# Patient Record
Sex: Female | Born: 1995 | Race: White | Hispanic: No | Marital: Married | Smoking: Never smoker
Health system: Southern US, Community
[De-identification: ages and names within clinical notes are randomized; demographics above are authoritative.]

## PROBLEM LIST (undated history)

## (undated) ENCOUNTER — Inpatient Hospital Stay: Payer: Self-pay

## (undated) DIAGNOSIS — G43909 Migraine, unspecified, not intractable, without status migrainosus: Secondary | ICD-10-CM

## (undated) DIAGNOSIS — R55 Syncope and collapse: Secondary | ICD-10-CM

## (undated) DIAGNOSIS — J45909 Unspecified asthma, uncomplicated: Secondary | ICD-10-CM

## (undated) DIAGNOSIS — Q2111 Secundum atrial septal defect: Secondary | ICD-10-CM

## (undated) DIAGNOSIS — Q211 Atrial septal defect: Secondary | ICD-10-CM

## (undated) DIAGNOSIS — D649 Anemia, unspecified: Secondary | ICD-10-CM

## (undated) HISTORY — DX: Syncope and collapse: R55

---
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
## 2015-01-24 ENCOUNTER — Ambulatory Visit
Admission: RE | Admit: 2015-01-24 | Discharge: 2015-01-24 | Disposition: A | Discharge status: Home / Self Care | Payer: Medicaid Other | Source: Ambulatory Visit | Attending: Obstetrics and Gynecology | Admitting: Obstetrics and Gynecology

## 2015-01-24 VITALS — Wt 165.0 lb

## 2015-01-24 DIAGNOSIS — Z8719 Personal history of other diseases of the digestive system: Secondary | ICD-10-CM | POA: Insufficient documentation

## 2015-01-24 DIAGNOSIS — Z8774 Personal history of (corrected) congenital malformations of heart and circulatory system: Secondary | ICD-10-CM

## 2015-01-24 DIAGNOSIS — Z9889 Other specified postprocedural states: Secondary | ICD-10-CM

## 2015-01-24 NOTE — Progress Notes (Signed)
The patient was scheduled for genetic counseling today with [facility] in [place] (at [facility]).  The patient has a personal history of diaphragmatic hernia and ASD, both of which required surgery at an early age.  The patient called prior to her appointment to request an ultrasound appointment, which we could not provide today but informed her that an ultrasound appointment had been scheduled for 01/28/15.  She was offered the option to have her genetic counseling today (01/24/15) or on Monday, 01/28/15 at the time of her ultrasound.    When the patient presented today, she informed us that an ultrasound could be performed at [facility] at 3:30pm today.  She is interested in knowing fetal sex.  We informed her that her referring physician had recommended a detailed fetal ultrasound due to her personal history and explained the benefits of a detailed study in evaluating the baby for anatomical abnormalities.  When our schedule could not permit her to be added on for ultrasound today, she stated that she was not going to stay.  She cancelled her genetic counseling appointment and then chose to cancel her ultrasound on Monday.  Our nurse, Shanda Bumps, contacted [facility] and spoke with Baird Lyons to convey this information.  Baird Lyons confirmed that the ultrasound appointment scheduled for 01/28/15 should be cancelled.  If genetic counseling or future ultrasound appointments are desired, please feel free to contact our office so an appointment can be scheduled.

## 2015-01-24 NOTE — Progress Notes (Signed)
Pt with complex history of [facility] and ASD She declined full appt today  We can see her in the future if desired

## 2015-01-28 ENCOUNTER — Inpatient Hospital Stay: Admission: RE | Admit: 2015-01-28 | Payer: Medicaid Other | Source: Ambulatory Visit

## 2015-02-18 ENCOUNTER — Observation Stay
Admission: EM | Admit: 2015-02-18 | Discharge: 2015-02-18 | Disposition: A | Discharge status: Home / Self Care | Payer: Medicaid Other | Attending: Advanced Practice Midwife | Admitting: Advanced Practice Midwife

## 2015-02-18 DIAGNOSIS — Z3A22 22 weeks gestation of pregnancy: Secondary | ICD-10-CM | POA: Insufficient documentation

## 2015-02-18 DIAGNOSIS — O210 Mild hyperemesis gravidarum: Principal | ICD-10-CM | POA: Insufficient documentation

## 2015-02-18 LAB — URINALYSIS COMPLETE WITH MICROSCOPIC (ARMC ONLY)
BILIRUBIN URINE: NEGATIVE
Glucose, UA: NEGATIVE mg/dL
HGB URINE DIPSTICK: NEGATIVE
Nitrite: NEGATIVE
Protein, ur: NEGATIVE mg/dL
Specific Gravity, Urine: 1.017 (ref 1.005–1.030)
pH: 6 (ref 5.0–8.0)

## 2015-02-18 MED ORDER — PROMETHAZINE HCL 25 MG/ML IJ SOLN
12.5000 mg | Freq: Once | INTRAMUSCULAR | Status: DC | PRN
Start: 2015-02-18 — End: 2015-02-19
  Administered 2015-02-18: 12.5 mg via INTRAMUSCULAR
  Filled 2015-02-18: qty 1

## 2015-02-18 NOTE — OB Triage Note (Signed)
Pt presents to L&D with c/o vomiting since yesterday and abdominal pain. Reports watery white discharge, no bleeding. Good fetal movement.

## 2015-02-18 NOTE — Discharge Instructions (Signed)
Call provider or return to birthplace with: ° °1. Regular contractions °2. Leaking of fluid from your vaginal °3. Vaginal bleeding: Bright red or heavy like a period °4. Decreased Fetal movement ° °

## 2015-02-18 NOTE — Discharge Summary (Signed)
Obstetric History and Physical  Vicki Adams is a 19 y.o. G1P0 with Estimated Date of Delivery: 06/20/15 per 8 wk [place] who presents at [redacted]w[redacted]d  presenting for nausea and abdominal pain. Pt reports feeling chills last night and having vomiting several times today. Denies sick contacts. No diarrhea. Mild cramping this evening. Reports social stress d/t conflict with neighbors. Patient states she has been having no contractions, no vaginal bleeding, intact membranes, with active fetal movement.    Prenatal Course Source of Care: [facility]  with onset of care at 7 weeks Pregnancy complications or risks: Patient Active Problem List   Diagnosis Date Noted  . Abdominal pain affecting pregnancy 02/18/2015  . History of percutaneous transcatheter closure of congenital ASD 01/24/2015  . History of esophageal hernia repair 01/24/2015     Prenatal Transfer Tool   Past Medical History  Diagnosis Date  . 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 Hernia repair    . Cardiac surgery  11/2000  . Hernia repair  08-05-95    esophageal hernia repair done at 78 weeks of age at [facility] History  Gravida Para Term Preterm AB SAB TAB Ectopic Multiple Living  1             # Outcome Date GA Lbr Len/2nd Weight Sex Delivery Anes PTL Lv  1 Current               Social History   Social History  . Marital Status: Single    Spouse Name: N/A  . Number of Children: N/A  . Years of Education: N/A   Social History Main Topics  . Smoking status: Never Smoker   . Smokeless tobacco: Never Used  . Alcohol Use: No     Comment: occ  . Drug Use: No  . Sexual Activity: Not Asked   Other Topics Concern  . None   Social History Narrative    Family History  Problem Relation Age of Onset  . Migraines Mother   . Kidney disease Father   . Obesity Father   . Scoliosis Sister   . Emphysema Maternal Grandmother   . Heart failure Maternal Grandmother        Allergies  Allergen Reactions  . Erythromycin Anaphylaxis    Review of Systems: Negative except for what is mentioned in HPI.  Physical Exam: BP 117/66 mmHg  Pulse 99  Temp(Src) 98 F (36.7 C) (Oral)  Resp 18  LMP 09/21/2014 (Approximate) GENERAL: Well-developed, well-nourished female in no acute distress.  LUNGS: Clear to auscultation bilaterally.  HEART: Regular rate and rhythm. ABDOMEN: Soft, nontender, nondistended, gravid. EXTREMITIES: Nontender, no edema Cervical Exam: deferred  FHT: + FHT's Contractions: none per toco   Pertinent Labs/Studies:   Results for orders placed or performed during the hospital encounter of 02/18/15 (from the past 24 hour(s))  Urinalysis complete, with microscopic ([facility] only)     Status: Abnormal   Collection Time: 02/18/15  9:11 PM  Result Value Ref Range   Color, Urine YELLOW (A) YELLOW   APPearance HAZY (A) CLEAR   Glucose, UA NEGATIVE NEGATIVE mg/dL   Bilirubin Urine NEGATIVE NEGATIVE   Ketones, ur 1+ (A) NEGATIVE mg/dL   Specific Gravity, Urine 1.017 1.005 - 1.030   Hgb urine dipstick NEGATIVE NEGATIVE   pH 6.0 5.0 - 8.0   Protein, ur NEGATIVE NEGATIVE mg/dL   Nitrite NEGATIVE NEGATIVE  Leukocytes, UA TRACE (A) NEGATIVE   RBC / HPF 0-5 0 - 5 RBC/hpf   WBC, UA 6-30 0 - 5 WBC/hpf   Bacteria, UA MANY (A) NONE SEEN   Squamous Epithelial / LPF 0-5 (A) NONE SEEN   Mucous PRESENT     Assessment : IUP at [redacted]w[redacted]d, nausea  Plan: Pt given phenergan IM while awaiting labs, reports nausea is improved. Has rx to take at home prn. Encouraged to maintain hydration and rest at home Discharge Home

## 2015-04-18 ENCOUNTER — Observation Stay
Admission: EM | Admit: 2015-04-18 | Discharge: 2015-04-19 | Disposition: A | Discharge status: Home / Self Care | Payer: Medicaid Other | Attending: Obstetrics & Gynecology | Admitting: Obstetrics & Gynecology

## 2015-04-18 ENCOUNTER — Encounter: Payer: Self-pay | Admitting: *Deleted

## 2015-04-18 DIAGNOSIS — Z3A31 31 weeks gestation of pregnancy: Secondary | ICD-10-CM | POA: Insufficient documentation

## 2015-04-18 DIAGNOSIS — R102 Pelvic and perineal pain: Secondary | ICD-10-CM | POA: Insufficient documentation

## 2015-04-18 DIAGNOSIS — O26893 Other specified pregnancy related conditions, third trimester: Principal | ICD-10-CM | POA: Insufficient documentation

## 2015-04-19 DIAGNOSIS — O26893 Other specified pregnancy related conditions, third trimester: Secondary | ICD-10-CM | POA: Diagnosis not present

## 2015-04-19 DIAGNOSIS — Z3A31 31 weeks gestation of pregnancy: Secondary | ICD-10-CM | POA: Diagnosis not present

## 2015-04-19 DIAGNOSIS — R102 Pelvic and perineal pain: Secondary | ICD-10-CM | POA: Diagnosis not present

## 2015-04-19 NOTE — Discharge Summary (Addendum)
Vicki Adams is a 19 y.o. female. She is at 22w1dgestation.  Indication: doesn't feel right  S: Resting comfortably. no CTX, no VB. Active fetal movement. Concerned about some flank pain she had 24 hours ago, and pelvic pain she had about 2 weeks ago. She does not have any symptoms at this time. Denies dysuria, fever/chills, nausea/vomiting, contractions, vaginal bleeding, loss of fetal movement, persistent pain, specifically located pain, change of vision, headache, RUQ pain, SOB, CP, leg pain, inguinal pain.  Patient called the emergency line and spoke to me regarding her symptoms that she does not have currently, after which I gave reassurances that she does not sound like she is in labor or having concerning issues so naturally she presented to triage for evaluation.  O: Pending transfer from nursing to computer chart.  Gen: NAD, AAOx3      Abd: FNTTP      Ext: Non-tender, Nonedmeatous    FHT: 135 mod + accels no decels TOCO: quiet, irritable SVE: deferred   A/P:  19yo G1P0 @ 31 weeks with a myriad of symptoms.   Preterm Labor: not present.   Fetal Wellbeing: Reassuring Cat 1 tracing.  D/c home stable, precautions reviewed, follow-up as scheduled.   Ward, CHonor Loh

## 2015-05-03 ENCOUNTER — Encounter: Payer: Self-pay | Admitting: *Deleted

## 2015-05-03 ENCOUNTER — Observation Stay
Admission: EM | Admit: 2015-05-03 | Discharge: 2015-05-03 | Disposition: A | Discharge status: Home / Self Care | Payer: Medicaid Other | Attending: Obstetrics and Gynecology | Admitting: Obstetrics and Gynecology

## 2015-05-03 DIAGNOSIS — Z3A33 33 weeks gestation of pregnancy: Secondary | ICD-10-CM | POA: Diagnosis not present

## 2015-05-03 DIAGNOSIS — O26893 Other specified pregnancy related conditions, third trimester: Principal | ICD-10-CM | POA: Insufficient documentation

## 2015-05-03 NOTE — OB Triage Note (Signed)
19yo 4498w1d [facility] patient presents to ED with c/o decreased FM and n/v/d for the previous two evenings.

## 2015-05-03 NOTE — Discharge Instructions (Signed)
Drink plenty of fluids and get plenty of rest.   Perform kick counts once daily.

## 2015-05-03 NOTE — Final Progress Note (Signed)
Physician Final Progress Note  Patient ID: Vicki Adams MRN: [id number] DOB/AGE: 19/12/1995 19 y.o.  Admit date: 05/03/2015 Admitting provider: Conard NovakStephen Adams Jordana Dugue, MD Discharge date: 05/03/2015   Admission Diagnoses:  1) Intrauterine pregnancy at 2549w1d  2) history of maternal atrial septal defect, repaired at age 38 years 3) decreased fetal movement  Discharge Diagnoses:  1) Intrauterine pregnancy at 4649w1d  2) history of maternal atrial septal defect, repaired at age 38 years 3) decreased fetal movement, reassuring antenatal testing (NST)   Consults: None  Significant Findings/ Diagnostic Studies: radiology: Ultrasound: Bedside ultrasound performed due to a few episodic variable decelerations. AFI grossly normal, multiple fetal movements noted on ultrasound.  Procedures: NST, baseline 135 bpm, moderate variability, +accels, 2-3 variable decelerations (to 100s, lasting about 20 second from baseline to baseline), no activity on tocometry.  Total NST time ~1 hour  Discharge Condition: stable  Disposition: 01-Home or Self Care  Diet: Regular diet  Discharge Activity: Activity as tolerated     Medication List    ASK your doctor about these medications        acetaminophen 500 MG tablet  Commonly known as:  TYLENOL  Take 500 mg by mouth every 6 (six) hours as needed.     ferrous sulfate 325 (65 FE) MG tablet  Take 325 mg by mouth daily with breakfast.     ondansetron 4 MG tablet  Commonly known as:  ZOFRAN  Take 1 tablet (4 mg total) by mouth daily as needed for nausea or vomiting.     prenatal multivitamin Tabs tablet  Take 1 tablet by mouth daily at 12 noon.         Total time spent taking care of this patient: 30 minutes  Signed: Conard NovakJackson, Vicki Adams 05/03/2015, 8:38 PM

## 2015-06-01 ENCOUNTER — Observation Stay
Admission: EM | Admit: 2015-06-01 | Discharge: 2015-06-01 | Disposition: A | Discharge status: Home / Self Care | Payer: Medicaid Other | Attending: Obstetrics and Gynecology | Admitting: Obstetrics and Gynecology

## 2015-06-01 DIAGNOSIS — Z3A37 37 weeks gestation of pregnancy: Secondary | ICD-10-CM | POA: Diagnosis not present

## 2015-06-01 DIAGNOSIS — O368130 Decreased fetal movements, third trimester, not applicable or unspecified: Principal | ICD-10-CM | POA: Insufficient documentation

## 2015-06-01 HISTORY — DX: Anemia, unspecified: D64.9

## 2015-06-01 NOTE — OB Triage Note (Signed)
19 y.o. female presents today with complaint of decreased fetal movement .  States that this started last night. Had some contractions last night and just occasional pain today. States that this is a 4/10 on pain scale when it does come, but it is not consistent and felt mainly in lower back. At home tried eating and drinking + laying down and doing fetal kick count, but was not noticing as much movement.  Denies bleeding or leaking of fluid.  Denies any UTI symptoms.  Placed on monitor and given fetal movement button (explained to patient how to use).  Audible movement on monitor noted, but patient denies feeling it.

## 2015-06-02 NOTE — Discharge Summary (Signed)
OB Triage Discharge Summary   19 y.o. G1P0000 @ 742w3d presenting for decreased FM.  Fetal status reassuring. EFM showed reactive NST and some uterine irritability Triage course: uncomplicated  Labs pending: none RTC 1wk Disposition: Home  Triage evaluation done remotely: Yes.    Cornelia Copaharlie Trinaty Bundrick, Jr MD [facility]  Pager: [phone]

## 2015-06-07 LAB — OB RESULTS CONSOLE GC/CHLAMYDIA
CHLAMYDIA, DNA PROBE: NEGATIVE
Gonorrhea: NEGATIVE

## 2015-06-07 LAB — OB RESULTS CONSOLE GBS: GBS: POSITIVE

## 2015-06-13 ENCOUNTER — Encounter: Payer: Self-pay | Admitting: *Deleted

## 2015-06-13 ENCOUNTER — Observation Stay
Admission: EM | Admit: 2015-06-13 | Discharge: 2015-06-14 | Disposition: A | Discharge status: Home / Self Care | Payer: Medicaid Other | Source: Ambulatory Visit | Attending: Obstetrics and Gynecology | Admitting: Obstetrics and Gynecology

## 2015-06-13 DIAGNOSIS — R112 Nausea with vomiting, unspecified: Secondary | ICD-10-CM | POA: Diagnosis present

## 2015-06-13 DIAGNOSIS — O212 Late vomiting of pregnancy: Secondary | ICD-10-CM | POA: Diagnosis not present

## 2015-06-13 DIAGNOSIS — Z3A39 39 weeks gestation of pregnancy: Secondary | ICD-10-CM | POA: Diagnosis not present

## 2015-06-13 NOTE — OB Triage Note (Signed)
Pt here due to emesis that started tonight around 2130 and again when she arrived here at the hospital.  Pt. Also states she has been having irregular contraction today.

## 2015-06-14 DIAGNOSIS — O212 Late vomiting of pregnancy: Secondary | ICD-10-CM | POA: Diagnosis not present

## 2015-06-14 NOTE — Final Progress Note (Signed)
Physician Final Progress Note  Patient ID: Vicki Adams MRN: [id number] DOB/AGE: 1995-08-11 19 y.o.  Admit date: 06/13/2015 Admitting provider: Conard Novak, MD , Tresea Mall, CNM Discharge date: 06/14/2015   Admission Diagnoses:  Patient Active Problem List   Diagnosis Date Noted  . Labor and delivery, indication for care 04/19/2015  . History of percutaneous transcatheter closure of congenital ASD 01/24/2015   Nausea and vomiting    [redacted] weeks gestation   . History of esophageal hernia repair 01/24/2015     Discharge Diagnoses:  Patient Active Problem List   Diagnosis Date Noted  . Labor and delivery, indication for care 04/19/2015  . History of percutaneous transcatheter closure of congenital ASD 01/24/2015   Nausea and vomiting    [redacted] weeks gestation   . History of esophageal hernia repair 01/24/2015    History of Present Illness: The patient is a 19 y.o. female G1P0000 at [redacted]w[redacted]d who presents for nausea and vomiting x2 acute onset on 12/22, denies blood in emesis, contractions starting mid day on 12/22, reports loss of mucous plug 12/22, also reports diarrhea x 1 week, denies fever  Past Medical History  Diagnosis Date  . Secundum ASD     closed 11/2000 at [facility]  . Anemia     Past Surgical History  Procedure Laterality Date  . Hernia repair    . Cardiac surgery  11/2000  . Hernia repair  22-Jul-1995    esophageal hernia repair done at 75 weeks of age at [facility]    No current facility-administered medications on file prior to encounter.   Current Outpatient Prescriptions on File Prior to Encounter  Medication Sig Dispense Refill  . acetaminophen (TYLENOL) 500 MG tablet Take 500 mg by mouth every 6 (six) hours as needed.    . ferrous sulfate 325 (65 FE) MG tablet Take 325 mg by mouth daily with breakfast.    . Prenatal Vit-Fe Fumarate-FA (PRENATAL MULTIVITAMIN) TABS tablet Take 1 tablet by mouth daily at 12 noon.      Allergies  Allergen Reactions  .  Erythromycin Anaphylaxis    Social History   Social History  . Marital Status: Single    Spouse Name: N/A  . Number of Children: N/A  . Years of Education: N/A   Occupational History  . Not on file.   Social History Main Topics  . Smoking status: Never Smoker   . Smokeless tobacco: Never Used  . Alcohol Use: No     Comment: occ  . Drug Use: No  . Sexual Activity: Yes   Other Topics Concern  . Not on file   Social History Narrative    Physical Exam: BP 127/71 mmHg  Pulse 73  Temp(Src) 98.4 F (36.9 C) (Oral)  Resp 17  Ht  (1.651 m)  Wt 83.008 kg (183 lb)  BMI 30.45 kg/m2  LMP 09/21/2014 (Approximate)  Gen: NAD Pulm: moves air well Pelvic: Cervix 1/50/-3   Consults: None   Procedures: Fetal Heart Monitoring: Baseline 125bpm, moderate variability, positive accels, no decels  Discharge Condition: good  Disposition: 01-Home or Self Care  Diet: Regular diet  Discharge Activity: Activity as tolerated     Medication List    ASK your doctor about these medications        acetaminophen 500 MG tablet  Commonly known as:  TYLENOL  Take 500 mg by mouth every 6 (six) hours as needed.     ferrous sulfate 325 (65 FE) MG tablet  Take 325  mg by mouth daily with breakfast.     prenatal multivitamin Tabs tablet  Take 1 tablet by mouth daily at 12 noon.         Total time spent taking care of this patient: 30 minutes  Signed: Tresea MallGLEDHILL,Desarae Placide, CNM  06/14/2015, 12:17 AM   This patient and plan were discussed with Dr Jean RosenthalJackson 06/14/2015

## 2015-06-14 NOTE — Progress Notes (Signed)
Discharge instructions & labor precautions reviewed with patient who verbalized understanding.  Discharged home in stable, ambulatory condition.

## 2015-06-20 ENCOUNTER — Inpatient Hospital Stay
Admission: EM | Admit: 2015-06-20 | Discharge: 2015-06-20 | Disposition: A | Discharge status: Home / Self Care | Payer: Medicaid Other | Source: Ambulatory Visit | Attending: Obstetrics and Gynecology | Admitting: Obstetrics and Gynecology

## 2015-06-20 DIAGNOSIS — Z3A40 40 weeks gestation of pregnancy: Secondary | ICD-10-CM | POA: Insufficient documentation

## 2015-06-20 NOTE — OB Triage Note (Signed)
C/O contractions since last night, however became worse around 1900.    Denies ROM, VB    +FM.

## 2015-06-20 NOTE — Discharge Instructions (Signed)
Keep next appointment with the office.  Drink at least 8-10 glasses of water.

## 2015-06-20 NOTE — Progress Notes (Signed)
Pt given d/c inst and verbalized understanding.  Next appt is Friday.  D/C home in stable condition with FOB.

## 2015-06-20 NOTE — Progress Notes (Signed)
Report given to Dr. Vergie LivingPickens.

## 2015-06-21 ENCOUNTER — Inpatient Hospital Stay
Admission: RE | Admit: 2015-06-21 | Discharge: 2015-06-24 | DRG: 775 | Disposition: A | Discharge status: Home / Self Care | Payer: Medicaid Other | Attending: Obstetrics and Gynecology | Admitting: Obstetrics and Gynecology

## 2015-06-21 DIAGNOSIS — Z8249 Family history of ischemic heart disease and other diseases of the circulatory system: Secondary | ICD-10-CM

## 2015-06-21 DIAGNOSIS — Z841 Family history of disorders of kidney and ureter: Secondary | ICD-10-CM

## 2015-06-21 DIAGNOSIS — Z881 Allergy status to other antibiotic agents status: Secondary | ICD-10-CM

## 2015-06-21 DIAGNOSIS — Z3A40 40 weeks gestation of pregnancy: Secondary | ICD-10-CM

## 2015-06-21 DIAGNOSIS — Z825 Family history of asthma and other chronic lower respiratory diseases: Secondary | ICD-10-CM

## 2015-06-21 DIAGNOSIS — O99824 Streptococcus B carrier state complicating childbirth: Secondary | ICD-10-CM | POA: Diagnosis present

## 2015-06-22 DIAGNOSIS — Z881 Allergy status to other antibiotic agents status: Secondary | ICD-10-CM | POA: Diagnosis not present

## 2015-06-22 DIAGNOSIS — Z841 Family history of disorders of kidney and ureter: Secondary | ICD-10-CM | POA: Diagnosis not present

## 2015-06-22 DIAGNOSIS — Z3A40 40 weeks gestation of pregnancy: Secondary | ICD-10-CM | POA: Diagnosis not present

## 2015-06-22 DIAGNOSIS — Z825 Family history of asthma and other chronic lower respiratory diseases: Secondary | ICD-10-CM | POA: Diagnosis not present

## 2015-06-22 DIAGNOSIS — O99824 Streptococcus B carrier state complicating childbirth: Secondary | ICD-10-CM | POA: Diagnosis present

## 2015-06-22 DIAGNOSIS — Z8249 Family history of ischemic heart disease and other diseases of the circulatory system: Secondary | ICD-10-CM | POA: Diagnosis not present

## 2015-06-22 LAB — CBC
HCT: 39.2 % (ref 35.0–47.0)
Hemoglobin: 12.8 g/dL (ref 12.0–16.0)
MCH: 26.6 pg (ref 26.0–34.0)
MCHC: 32.7 g/dL (ref 32.0–36.0)
MCV: 81.3 fL (ref 80.0–100.0)
PLATELETS: 149 10*3/uL — AB (ref 150–440)
RBC: 4.82 MIL/uL (ref 3.80–5.20)
RDW: 16.5 % — AB (ref 11.5–14.5)
WBC: 14.3 10*3/uL — AB (ref 3.6–11.0)

## 2015-06-22 LAB — TYPE AND SCREEN
ABO/RH(D): O NEG
ANTIBODY SCREEN: POSITIVE

## 2015-06-22 LAB — ABO/RH: ABO/RH(D): O NEG

## 2015-06-22 LAB — URINALYSIS COMPLETE WITH MICROSCOPIC (ARMC ONLY)
BILIRUBIN URINE: NEGATIVE
Glucose, UA: NEGATIVE mg/dL
Hgb urine dipstick: NEGATIVE
Nitrite: NEGATIVE
PH: 6 (ref 5.0–8.0)
Protein, ur: 100 mg/dL — AB
Specific Gravity, Urine: 1.025 (ref 1.005–1.030)

## 2015-06-22 LAB — CHLAMYDIA/NGC RT PCR (ARMC ONLY)
Chlamydia Tr: NOT DETECTED
N gonorrhoeae: NOT DETECTED

## 2015-06-22 MED ORDER — BUTORPHANOL TARTRATE 1 MG/ML IJ SOLN: 1.0000 mg | INTRAMUSCULAR | Status: DC | PRN

## 2015-06-22 MED ORDER — BENZOCAINE-MENTHOL 20-0.5 % EX AERO
1.0000 "application " | INHALATION_SPRAY | CUTANEOUS | Status: DC | PRN
  Filled 2015-06-22: qty 56

## 2015-06-22 MED ORDER — LACTATED RINGERS IV SOLN
500.0000 mL | INTRAVENOUS | Status: DC | PRN
  Administered 2015-06-22: 1000 mL via INTRAVENOUS

## 2015-06-22 MED ORDER — BUTORPHANOL TARTRATE 1 MG/ML IJ SOLN
1.0000 mg | INTRAMUSCULAR | Status: DC | PRN
  Filled 2015-06-22: qty 2

## 2015-06-22 MED ORDER — DIBUCAINE 1 % RE OINT: 1.0000 "application " | TOPICAL_OINTMENT | RECTAL | Status: DC | PRN

## 2015-06-22 MED ORDER — IBUPROFEN 600 MG PO TABS
ORAL_TABLET | ORAL | Status: AC
  Administered 2015-06-22: 600 mg via ORAL
  Filled 2015-06-22: qty 1

## 2015-06-22 MED ORDER — LACTATED RINGERS IV SOLN
INTRAVENOUS | Status: DC
  Administered 2015-06-22: 125 mL/h via INTRAVENOUS

## 2015-06-22 MED ORDER — SODIUM CHLORIDE 0.9 % IV SOLN
2.0000 g | Freq: Once | INTRAVENOUS | Status: AC
  Administered 2015-06-22: 2 g via INTRAVENOUS

## 2015-06-22 MED ORDER — MISOPROSTOL 200 MCG PO TABS
ORAL_TABLET | ORAL | Status: AC
  Filled 2015-06-22: qty 4

## 2015-06-22 MED ORDER — SIMETHICONE 80 MG PO CHEW: 80.0000 mg | CHEWABLE_TABLET | ORAL | Status: DC | PRN

## 2015-06-22 MED ORDER — INFLUENZA VAC SPLIT QUAD 0.5 ML IM SUSY: 0.5000 mL | PREFILLED_SYRINGE | INTRAMUSCULAR | Status: DC

## 2015-06-22 MED ORDER — IBUPROFEN 600 MG PO TABS
600.0000 mg | ORAL_TABLET | Freq: Four times a day (QID) | ORAL | Status: DC
  Administered 2015-06-22 – 2015-06-24 (×10): 600 mg via ORAL
  Filled 2015-06-22 (×9): qty 1

## 2015-06-22 MED ORDER — LANOLIN HYDROUS EX OINT: TOPICAL_OINTMENT | CUTANEOUS | Status: DC | PRN

## 2015-06-22 MED ORDER — BUTORPHANOL TARTRATE 1 MG/ML IJ SOLN: 2.0000 mg | Freq: Once | INTRAMUSCULAR | Status: DC

## 2015-06-22 MED ORDER — LACTATED RINGERS IV SOLN: INTRAVENOUS | Status: DC

## 2015-06-22 MED ORDER — VARICELLA VIRUS VACCINE LIVE 1350 PFU/0.5ML IJ SUSR
0.5000 mL | Freq: Once | INTRAMUSCULAR | Status: AC
  Administered 2015-06-24: 0.5 mL via SUBCUTANEOUS
  Filled 2015-06-22: qty 0.5

## 2015-06-22 MED ORDER — ONDANSETRON HCL 4 MG PO TABS: 4.0000 mg | ORAL_TABLET | ORAL | Status: DC | PRN

## 2015-06-22 MED ORDER — LIDOCAINE HCL (PF) 1 % IJ SOLN
INTRAMUSCULAR | Status: AC
  Administered 2015-06-22: 30 mL
  Filled 2015-06-22: qty 30

## 2015-06-22 MED ORDER — SODIUM CHLORIDE 0.9 % IV SOLN
INTRAVENOUS | Status: AC
  Administered 2015-06-22: 2 g via INTRAVENOUS
  Filled 2015-06-22: qty 2000

## 2015-06-22 MED ORDER — ACETAMINOPHEN 325 MG PO TABS: 650.0000 mg | ORAL_TABLET | ORAL | Status: DC | PRN

## 2015-06-22 MED ORDER — OXYTOCIN 40 UNITS IN LACTATED RINGERS INFUSION - SIMPLE MED: 62.5000 mL/h | INTRAVENOUS | Status: DC

## 2015-06-22 MED ORDER — OXYTOCIN BOLUS FROM INFUSION
500.0000 mL | INTRAVENOUS | Status: DC
  Administered 2015-06-22: 500 mL via INTRAVENOUS

## 2015-06-22 MED ORDER — MEASLES, MUMPS & RUBELLA VAC ~~LOC~~ INJ
0.5000 mL | INJECTION | Freq: Once | SUBCUTANEOUS | Status: AC
  Administered 2015-06-24: 0.5 mL via SUBCUTANEOUS
  Filled 2015-06-22: qty 0.5

## 2015-06-22 MED ORDER — SODIUM CHLORIDE 0.9 % IV SOLN
1.0000 g | INTRAVENOUS | Status: DC
  Administered 2015-06-22: 1 g via INTRAVENOUS
  Filled 2015-06-22: qty 1000

## 2015-06-22 MED ORDER — ZOLPIDEM TARTRATE 5 MG PO TABS: 5.0000 mg | ORAL_TABLET | Freq: Every evening | ORAL | Status: DC | PRN

## 2015-06-22 MED ORDER — BUTORPHANOL TARTRATE 1 MG/ML IJ SOLN
2.0000 mg | INTRAMUSCULAR | Status: DC | PRN
  Administered 2015-06-22 (×2): 2 mg via INTRAVENOUS
  Filled 2015-06-22 (×2): qty 2

## 2015-06-22 MED ORDER — FENTANYL 2.5 MCG/ML W/ROPIVACAINE 0.2% IN NS 100 ML EPIDURAL INFUSION (ARMC-ANES)
EPIDURAL | Status: AC
  Filled 2015-06-22: qty 100

## 2015-06-22 MED ORDER — DOCUSATE SODIUM 100 MG PO CAPS
100.0000 mg | ORAL_CAPSULE | Freq: Two times a day (BID) | ORAL | Status: DC
  Administered 2015-06-22 – 2015-06-24 (×4): 100 mg via ORAL
  Filled 2015-06-22 (×4): qty 1

## 2015-06-22 MED ORDER — OXYTOCIN 10 UNIT/ML IJ SOLN
INTRAMUSCULAR | Status: AC
  Filled 2015-06-22: qty 2

## 2015-06-22 MED ORDER — ONDANSETRON HCL 4 MG/2ML IJ SOLN: 4.0000 mg | Freq: Four times a day (QID) | INTRAMUSCULAR | Status: DC | PRN

## 2015-06-22 MED ORDER — WITCH HAZEL-GLYCERIN EX PADS: 1.0000 "application " | MEDICATED_PAD | CUTANEOUS | Status: DC | PRN

## 2015-06-22 MED ORDER — BUTORPHANOL TARTRATE 1 MG/ML IJ SOLN
2.0000 mg | INTRAMUSCULAR | Status: DC | PRN
  Administered 2015-06-22: 2 mg via INTRAVENOUS

## 2015-06-22 MED ORDER — PRENATAL MULTIVITAMIN CH
1.0000 | ORAL_TABLET | Freq: Every day | ORAL | Status: DC
  Administered 2015-06-22 – 2015-06-24 (×3): 1 via ORAL
  Filled 2015-06-22 (×3): qty 1

## 2015-06-22 MED ORDER — AMMONIA AROMATIC IN INHA
RESPIRATORY_TRACT | Status: AC
  Filled 2015-06-22: qty 10

## 2015-06-22 MED ORDER — OXYTOCIN 40 UNITS IN LACTATED RINGERS INFUSION - SIMPLE MED
INTRAVENOUS | Status: AC
  Filled 2015-06-22: qty 1000

## 2015-06-22 MED ORDER — DIPHENHYDRAMINE HCL 25 MG PO CAPS: 25.0000 mg | ORAL_CAPSULE | Freq: Four times a day (QID) | ORAL | Status: DC | PRN

## 2015-06-22 MED ORDER — ONDANSETRON HCL 4 MG/2ML IJ SOLN: 4.0000 mg | INTRAMUSCULAR | Status: DC | PRN

## 2015-06-22 MED ORDER — LACTATED RINGERS IV BOLUS (SEPSIS)
500.0000 mL | Freq: Once | INTRAVENOUS | Status: AC
  Administered 2015-06-22: 500 mL via INTRAVENOUS

## 2015-06-22 MED ORDER — FERROUS SULFATE 325 (65 FE) MG PO TABS
325.0000 mg | ORAL_TABLET | Freq: Every day | ORAL | Status: DC
  Administered 2015-06-23 – 2015-06-24 (×2): 325 mg via ORAL
  Filled 2015-06-22 (×3): qty 1

## 2015-06-22 NOTE — H&P (Signed)
Obstetric History and Physical  Vicki Adams is a 19 y.o. G1P1000 with Estimated Date of Delivery: 06/20/15 per 9 wk US who presents at 2227w1d  presenting for leaking fluid and contractions. On initial exam per RN found to be intact and dilated 1.5 cm, shortly after this her membranes ruptured and pt quickly proceeded to current dilation of 9 cm.     Prenatal Course Source of Care: [facility]  with onset of care at 7 weeks Pregnancy complications or risks: Patient Active Problem List   Diagnosis Date Noted  . Decreased fetal movement 06/01/2015  . Labor and delivery, indication for care 04/19/2015  . Abdominal pain affecting pregnancy 02/18/2015  . History of percutaneous transcatheter closure of congenital ASD 01/24/2015  . History of esophageal hernia repair 01/24/2015   She plans to breastfeed    Prenatal labs and studies: ABO, Rh: O neg/Rhogam given 04/01/15  Antibody: neg Rubella: Non-Immune Varicella: Non-Immune RPR:  Non-reactive HBsAg:  negative HIV: negative GC/CT: neg/neg (h/o + chlamydia in early pregnancy) GBS: positive 1 hr Glucola: 96   Genetic screening: negative 1st trimester screen and AFP    Prenatal Transfer Tool   Past Medical History  Diagnosis Date  . Secundum ASD     [facility]  . Anemia     Past Surgical History  Procedure Laterality Date  . Hernia repair    . Cardiac surgery  11/2000  . Hernia repair  06/1995    esophageal her[facility] at 622 weeks of age [facility]    OB History  Gravida Para Term Preterm AB SAB TAB Ectopic Multiple Living  1 1 1  0 0 0 0 0 0 0    # Outcome Date GA Lbr Len/2nd Weight Sex Delivery Anes PTL Lv  1 Term  4034w2d             Social History   Social History  . Marital Status: Single    Spouse Name: N/A  . Number of Children: N/A  . Years of Education: N/A   Social History Main Topics  . Smoking status: Never Smoker   . Smokeless tobacco: Never Used  . Alcohol Use: No     Comment: occ   . Drug Use: No  . Sexual Activity: Yes   Other Topics Concern  . None   Social History Narrative    Family History  Problem Relation Age of Onset  . Migraines Mother   . Kidney disease Father   . Obesity Father   . Scoliosis Sister   . Emphysema Maternal Grandmother   . Heart failure Maternal Grandmother     Prescriptions prior to admission  Medication Sig Dispense Refill Last Dose  . acetaminophen (TYLENOL) 500 MG tablet Take 500 mg by mouth every 6 (six) hours as needed.   06/12/2015 at Unknown time  . ferrous sulfate 325 (65 FE) MG tablet Take 325 mg by mouth daily with breakfast.   06/12/2015 at Unknown time  . Prenatal Vit-Fe Fumarate-FA (PRENATAL MULTIVITAMIN) TABS tablet Take 1 tablet by mouth daily at 12 noon.   06/12/2015 at Unknown time    Allergies  Allergen Reactions  . Erythromycin Anaphylaxis    Review of Systems: Negative except for what is mentioned in HPI.  Physical Exam: BP 134/84 mmHg  Pulse 98  Temp(Src) 97.7 F (36.5 C) (Oral)  Resp 18  Ht 5\' 5"  (1.651 m)  Wt 183 lb (83.008 kg)  BMI 30.45 kg/m2  LMP 09/21/2014 (  Approximate)  Breastfeeding? Unknown GENERAL: Well-developed, well-nourished female in no acute distress.  LUNGS: Clear to auscultation bilaterally.  HEART: Regular rate and rhythm. ABDOMEN: tender r/t labor EXTREMITIES: Nontender, no edema Cervical Exam: Dilatation 9cm   Effacement 100%   Station -1   Presentation: cephalic FHT: 120, difficult to maintain continuous FHR strip d/t fetal and pt movement. Occasional variables. Contractions: Every 2-3 mins   Pertinent Labs/Studies:   Results for orders placed or performed during the hospital encounter of 06/21/15 (from the past 24 hour(s))  Urinalysis complete, with microscopic ([facility] only)     Status: Abnormal   Collection Time: 06/22/15 12:22 AM  Result Value Ref Range   Color, Urine AMBER (A) YELLOW   APPearance HAZY (A) CLEAR   Glucose, UA NEGATIVE NEGATIVE mg/dL   Bilirubin  Urine NEGATIVE NEGATIVE   Ketones, ur TRACE (A) NEGATIVE mg/dL   Specific Gravity, Urine 1.025 1.005 - 1.030   Hgb urine dipstick NEGATIVE NEGATIVE   pH 6.0 5.0 - 8.0   Protein, ur 100 (A) NEGATIVE mg/dL   Nitrite NEGATIVE NEGATIVE   Leukocytes, UA TRACE (A) NEGATIVE   RBC / HPF 0-5 0 - 5 RBC/hpf   WBC, UA 6-30 0 - 5 WBC/hpf   Bacteria, UA RARE (A) NONE SEEN   Squamous Epithelial / LPF 0-5 (A) NONE SEEN   Mucous PRESENT   Chlamydia/NGC rt PCR ([facility] only)     Status: None   Collection Time: 06/22/15  2:08 AM  Result Value Ref Range   Specimen source GC/Chlam URINE, RANDOM    Chlamydia Tr NOT DETECTED NOT DETECTED   N gonorrhoeae NOT DETECTED NOT DETECTED  CBC     Status: Abnormal   Collection Time: 06/22/15  3:22 AM  Result Value Ref Range   WBC 14.3 (H) 3.6 - 11.0 K/uL   RBC 4.82 3.80 - 5.20 MIL/uL   Hemoglobin 12.8 12.0 - 16.0 g/dL   HCT 41.3 24.4 - 01.0 %   MCV 81.3 80.0 - 100.0 fL   MCH 26.6 26.0 - 34.0 pg   MCHC 32.7 32.0 - 36.0 g/dL   RDW 27.2 (H) 53.6 - 64.4 %   Platelets 149 (L) 150 - 440 K/uL  Type and screen [facility]     Status: None   Collection Time: 06/22/15  3:22 AM  Result Value Ref Range   ABO/RH(D) O NEG    Antibody Screen POS    Sample Expiration 06/25/2015    Antibody Identification PASSIVELY ACQUIRED ANTI-D     Assessment : IUP at [redacted]w[redacted]d in advanced labor  Plan: Admitted for labor  GBS+ receiving antibiotics ppx FWB - category 2 tracing d/t variables Anesthesia - pt requested epidural, anesthesia has been contacted Anticipate vaginal delivery

## 2015-06-22 NOTE — Progress Notes (Signed)
Report called to T.Brothers, CNM

## 2015-06-22 NOTE — OB Triage Note (Signed)
Pt to L&D with c/o increased contractions and ?ROM.  Denies bleeding.  + FM.  Seen last pm for the same complaints.

## 2015-06-22 NOTE — Progress Notes (Signed)
Pt continues to push with T. Brothers, CNM at bedside.  Report given to dayshift at this time.

## 2015-06-22 NOTE — Progress Notes (Signed)
Pt called out.  SROM with light green mec noted.  + Nitrazine.

## 2015-06-23 LAB — FETAL SCREEN: FETAL SCREEN: NEGATIVE

## 2015-06-23 LAB — HEMOGLOBIN AND HEMATOCRIT, BLOOD
HEMATOCRIT: 36 % (ref 35.0–47.0)
Hemoglobin: 11.8 g/dL — ABNORMAL LOW (ref 12.0–16.0)

## 2015-06-23 LAB — RPR: RPR: NONREACTIVE

## 2015-06-23 MED ORDER — INFLUENZA VAC SPLIT QUAD 0.5 ML IM SUSY: 0.5000 mL | PREFILLED_SYRINGE | INTRAMUSCULAR | Status: DC | PRN

## 2015-06-23 MED ORDER — RHO D IMMUNE GLOBULIN 1500 UNIT/2ML IJ SOSY
300.0000 ug | PREFILLED_SYRINGE | Freq: Once | INTRAMUSCULAR | Status: AC
  Administered 2015-06-23: 300 ug via INTRAMUSCULAR
  Filled 2015-06-23: qty 2

## 2015-06-23 MED ORDER — HYDROCODONE-ACETAMINOPHEN 5-325 MG PO TABS
1.0000 | ORAL_TABLET | Freq: Four times a day (QID) | ORAL | Status: DC | PRN
  Administered 2015-06-23 – 2015-06-24 (×5): 1 via ORAL
  Filled 2015-06-23 (×5): qty 1

## 2015-06-23 NOTE — Progress Notes (Signed)
  Postpartum Day 1  Subjective: voiding, tolerating PO and tired  Objective: Blood pressure 114/56, pulse 80, temperature 97.7 F (36.5 C), temperature source Oral, resp. rate 18, height 5\' 5"  (1.651 m), weight 183 lb (83.008 kg), last menstrual period 09/21/2014, SpO2 97 %  Physical Exam:  General: cooperative Lochia: appropriate Uterine Fundus: firm Incision: N/A DVT Evaluation: No evidence of DVT seen on physical exam. Negative Homan's sign. Abdomen: soft, NT   Recent Labs  06/22/15 0322 06/23/15 0543  HGB 12.8 11.8*  HCT 39.2 36.0    Assessment PPD #1  Plan: Continue PP care and Advance activity as tolerated  Feeding: bottle (considering pumping. Benefits of breastfeeding discussed along with need for frequent pumping/feeding.) Contraception: undecided RNI/VNI - vaccines ordered prior to discharge TDAP UTD Flu: desires prior to discharge Blood Type: O neg/baby O+ - Rhogam ordered   Vicki Adams, Vicki Adams, CNM 06/23/2015, 9:26 AM

## 2015-06-23 NOTE — Discharge Summary (Signed)
Obstetrical Discharge Summary  Date of Admission: 06/21/2015 Date of Discharge: 06/24/2015  Primary OB: [facility]  Gestational Age at Delivery: 9434w2d   Antepartum complications: none Reason for Admission: SROM, labor Date of Delivery: 06/22/15  Delivered By: T. Brothers, CNM Delivery Type: spontaneous vaginal delivery Intrapartum complications/course: None Anesthesia: none Placenta: spontaneous. Intact: yes. To pathology: no.  Laceration: vaginal Episiotomy: none Baby: Liveborn female, APGARs8/9, weight 3180 g/7#0oz.   Postpartum course:unremarkable postpartum course. By time of discharge on PPD #2, patient was afebrile, eating a regular diet, having small amount of bleeding,  and voiding without difficulty. She received Rhogam as her baby's blood type was O positive.  Discharge Vital Signs:  Current Vital Signs 24h Vital Sign Ranges  T 98 F (36.7 C) Temp  Avg: 98 F (36.7 C)  Min: 97.6 F (36.4 C)  Max: 98.4 F (36.9 C)  BP 105/77 mmHg BP  Min: 105/77  Max: 119/75  HR 75 Pulse  Avg: 81.2  Min: 75  Max: 88  RR 20 Resp  Avg: 15  Min: 2  Max: 20  SaO2 99 % Not Delivered SpO2  Avg: 98.8 %  Min: 98 %  Max: 99 %         Discharge Exam:  NAD Perineum:intact, without hematoma or drainage Abdomen: firm fundus 1 FB below the umbilicus Ext: no signs of DVT  Disposition: Home  Rh Immune globulin given: yes Rubella vaccine given: yes Tdap vaccine given in AP or PP setting: yes Flu vaccine given in AP or PP setting: ordered  Contraception: undecided  Prenatal/Postnatal Panel: O NEG//Rubella Not immune//Varicella Not immune//RPR negative//HIV negative/HepB Surface Ag negative//plans to bottle feed  Plan:  Vicki PoseyCheyenne Nicole Adams was discharged to home in good condition. Follow-up appointment with T. Brothers, CNM in 6 weeks  Discharge Medications:   Medication List    STOP taking these medications        ferrous sulfate 325 (65 FE) MG tablet      TAKE these  medications        acetaminophen 500 MG tablet  Commonly known as:  TYLENOL  Take 500 mg by mouth every 6 (six) hours as needed.     benzocaine-Menthol 20-0.5 % Aero  Commonly known as:  DERMOPLAST  Apply 1 application topically as needed for irritation (perineal discomfort).     HYDROcodone-acetaminophen 5-325 MG tablet  Commonly known as:  NORCO/VICODIN  Take 1-2 tablets by mouth every 6 (six) hours as needed for severe pain.     ibuprofen 600 MG tablet  Commonly known as:  ADVIL,MOTRIN  Take 1 tablet (600 mg total) by mouth every 6 (six) hours as needed.     prenatal multivitamin Tabs tablet  Take 1 tablet by mouth daily at 12 noon.

## 2015-06-23 NOTE — L&D Delivery Note (Signed)
Obstetrical Delivery Note   Date of Delivery:   05/12/2016 Primary OB:   [facility] Gestational Age/EDD: 4782w4d (Dated by LMP=8wk US) Antepartum complications: H/O maternal ASD repair  Delivered By:   Farrel ConnersGUTIERREZ, Saraih Lorton, CNM  Delivery Type:   spontaneous vaginal delivery  Procedure Details:   Called to see patient with urge to push while sitting up trying to get epidural. Cervix was 7-8 cm, but mother unable to stop pushing. FHR deceleration to 70s. Mother given O2 and a FSE was applied.  Cervix rapidly reduced to C/C with maternal pushing effort. Fetal heart bradycardia lasted 12 min. before a spontaneous vaginal delivery of a vigorous female infant in OA over an intact perineum. Apgars 7/9. After a delayed cord clamping, cord was cut by the FOB. Baby was placed on mother's abdomen initially and dried. Baby to the warmer for a evaluation by the PNP, then brought back to mother to hold.  Anesthesia:    none Intrapartum complications: fetal bardycardia GBS:    positive Laceration:    none Episiotomy:    none Placenta:    Via active 3rd stage. To pathology: no Estimated Blood Loss:  400 ml  Baby:    Liveborn female, Apgars 7/9, weight 7#11.8oz/ [place] [employer]    Arianna Delsanto, Jill SideOLLEEN, CNM

## 2015-06-23 NOTE — Lactation Note (Signed)
This note was copied from the chart of Vicki Adams. Lactation Consultation Note  Patient Name: Vicki Jethro BolusCheyenne Adams Today's Date: 06/23/2015 Reason for consult: Initial assessment;Breast/nipple pain   Maternal Data Does the patient have breastfeeding experience prior to this delivery?: No Assisted pt with latching baby to breast, pt unsure about breastfeeding, painful to breastfeed and has given bottles overnight and today, pumped breasts x1, wants to attempt breastfeeding 1 more time, breasts full in area of areola, short nipple, flattens when compressed, baby sucks on tongue and does not get deep latch, pt unable to sustain latch for long because of pain, pumped left nipple to elongate and soften areola, mom still unable to take the discomfort, wanted to try nipple shield and 20 mm nipple shield applied, baby latched well to this and was nursing with swallows, was not as uncomfortable but mom still could not stand the discomfort, she opted to pump breasts x 15 min, obtained approx. 0.2 cc coclostrum and this was bottlefed to baby along with 25 cc sim with fe by mom.  Pt unsure if she wants to continue to pump or bottlefeed, recommended she pump breasts every 3 hours until tomorrow and then try latching baby with shield again.  She states she will think about this.    eeding Feeding Type: Breast Milk with Formula added Nipple Type: Slow - flow Length of feed: 4 min  LATCH Score/Interventions Latch: Repeated attempts needed to sustain latch, nipple held in mouth throughout feeding, stimulation needed to elicit sucking reflex. Intervention(s): Adjust position;Assist with latch;Breast compression  Audible Swallowing: A few with stimulation  Type of Nipple: Flat Intervention(s): Double electric pump  Comfort (Breast/Nipple): Filling, red/small blisters or bruises, mild/mod discomfort  Problem noted: Severe discomfort Interventions (Severe discomfort): Double electric pum;Observe  pumping  Hold (Positioning): Assistance needed to correctly position infant at breast and maintain latch.  LATCH Score: 5  Lactation Tools Discussed/Used Tools: Pump;Bottle Nipple shield size: 20 Breast pump type: Double-Electric Breast Pump WIC Program: Yes   Consult Status Consult Status: Follow-up Date: 06/24/15 Follow-up type: In-patient    Dyann KiefMarsha D Saree Krogh 06/23/2015, 5:19 PM

## 2015-06-24 MED ORDER — HYDROCODONE-ACETAMINOPHEN 5-325 MG PO TABS: 1.0000 | ORAL_TABLET | Freq: Four times a day (QID) | ORAL | Status: DC | PRN

## 2015-06-24 MED ORDER — IBUPROFEN 600 MG PO TABS: 600.0000 mg | ORAL_TABLET | Freq: Four times a day (QID) | ORAL | Status: DC | PRN

## 2015-06-24 MED ORDER — BENZOCAINE-MENTHOL 20-0.5 % EX AERO: 1.0000 "application " | INHALATION_SPRAY | CUTANEOUS | Status: DC | PRN

## 2015-06-24 NOTE — Lactation Note (Signed)
Lactation Consultation Note  Patient Name: Vicki PoseyCheyenne Nicole Porter Today's Date: 06/24/2015     Maternal Data  Pt decided to only formula feed last night, for d/c today.  Reviewed engorgement signs and sympt and treatment.  Feeding    LATCH Score/Interventions                      Lactation Tools Discussed/Used     Consult Status      Dyann KiefMarsha D Zohan Shiflet 06/24/2015, 11:20 AM

## 2015-06-24 NOTE — Progress Notes (Signed)
Patient understands all discharge instructions and the need to make follow up appointments. Patient discharge via wheelchair with auxillary. 

## 2015-06-24 NOTE — Discharge Instructions (Signed)
° °Vaginal Delivery, Care After °Refer to this sheet in the next few weeks. These discharge instructions provide you with information on caring for yourself after delivery. Your caregiver may also give you specific instructions. Your treatment has been planned according to the most current medical practices available, but problems sometimes occur. Call your caregiver if you have any problems or questions after you go home. °HOME CARE INSTRUCTIONS °1. Take over-the-counter or prescription medicines only as directed by your caregiver or pharmacist. °2. Do not drink alcohol, especially if you are breastfeeding or taking medicine to relieve pain. °3. Do not smoke tobacco. °4. Continue to use good perineal care. Good perineal care includes: °1. Wiping your perineum from back to front °2. Keeping your perineum clean. °3. You can do sitz baths twice a day, to help keep this area clean °5. Do not use tampons, douche or have sex until your caregiver says it is okay. °6. Shower only and avoid sitting in submerged water, aside from sitz baths °7. Wear a well-fitting bra that provides breast support. °8. Eat healthy foods. °9. Drink enough fluids to keep your urine clear or pale yellow. °10. Eat high-fiber foods such as whole grain cereals and breads, brown rice, beans, and fresh fruits and vegetables every day. These foods may help prevent or relieve constipation. °11. Avoid constipation with high fiber foods or medications, such as miralax or metamucil °12. Follow your caregiver's recommendations regarding resumption of activities such as climbing stairs, driving, lifting, exercising, or traveling. °13. Talk to your caregiver about resuming sexual activities. Resumption of sexual activities is dependent upon your risk of infection, your rate of healing, and your comfort and desire to resume sexual activity. °14. Try to have someone help you with your household activities and your newborn for at least a few days after you  leave the hospital. °15. Rest as much as possible. Try to rest or take a nap when your newborn is sleeping. °16. Increase your activities gradually. °17. Keep all of your scheduled postpartum appointments. It is very important to keep your scheduled follow-up appointments. At these appointments, your caregiver will be checking to make sure that you are healing physically and emotionally. °SEEK MEDICAL CARE IF:  °· You are passing large clots from your vagina. Save any clots to show your caregiver. °· You have a foul smelling discharge from your vagina. °· You have trouble urinating. °· You are urinating frequently. °· You have pain when you urinate. °· You have a change in your bowel movements. °· You have increasing redness, pain, or swelling near your vaginal incision (episiotomy) or vaginal tear. °· You have pus draining from your episiotomy or vaginal tear. °· Your episiotomy or vaginal tear is separating. °· You have painful, hard, or reddened breasts. °· You have a severe headache. °· You have blurred vision or see spots. °· You feel sad or depressed. °· You have thoughts of hurting yourself or your newborn. °· You have questions about your care, the care of your newborn, or medicines. °· You are dizzy or light-headed. °· You have a rash. °· You have nausea or vomiting. °· You were breastfeeding and have not had a menstrual period within 12 weeks after you stopped breastfeeding. °· You are not breastfeeding and have not had a menstrual period by the 12th week after delivery. °· You have a fever. °SEEK IMMEDIATE MEDICAL CARE IF:  °· You have persistent pain. °· You have chest pain. °· You have shortness of breath. °·   You faint.  You have leg pain.  You have stomach pain.  Your vaginal bleeding saturates two or more sanitary pads in 1 hour. MAKE SURE YOU:   Understand these instructions.  Will watch your condition.  Will get help right away if you are not doing well or get worse. Document Released:  06/05/2000 Document Revised: 10/23/2013 Document Reviewed: 02/03/2012 [facility] Patient Information 2015 [place], [place]. This information is not intended to replace advice given to you by your health care provider. Make sure you discuss any questions you have with your health care provider.  Sitz Bath A sitz bath is a warm water bath taken in the sitting position. The water covers only the hips and butt (buttocks). We recommend using one that fits in the toilet, to help with ease of use and cleanliness. It may be used for either healing or cleaning purposes. Sitz baths are also used to relieve pain, itching, or muscle tightening (spasms). The water may contain medicine. Moist heat will help you heal and relax.  HOME CARE  Take 3 to 4 sitz baths a day. 18. Fill the bathtub half-full with warm water. 19. Sit in the water and open the drain a little. 20. Turn on the warm water to keep the tub half-full. Keep the water running constantly. 21. Soak in the water for 15 to 20 minutes. 22. After the sitz bath, pat the affected area dry. GET HELP RIGHT AWAY IF: You get worse instead of better. Stop the sitz baths if you get worse. MAKE SURE YOU:  Understand these instructions.  Will watch your condition.  Will get help right away if you are not doing well or get worse. Document Released: 07/16/2004 Document Revised: 03/02/2012 Document Reviewed: 10/06/2010 [facility] Patient Information 2015 [place], [place]. This information is not intended to replace advice given to you by your health care provider. Make sure you discuss any questions you have with your health care provider.  Bleeding: Your bleeding could continue up to 6 weeks, the flow should gradually decrease and the color should become dark then lightened over the next couple of weeks. If you notice you are bleeding heavily or passing clots larger than the size of your fist, PLEASE call your physician. No TAMPONS, DOUCHING, ENEMAS OR SEXUAL  INTERCOURSE for 6 weeks.   Stitches: Shower daily with mild soap and water. Stitches will dissolve over the next couple of weeks, if you experience any discomfort in the vaginal area you may sit in warm water 15-20 minutes, 3-4 times per day. Just enough water to cover vaginal area.   AfterPains: This is the uterus contracting back to its normal position and size. Use medications prescribed or recommended by your physician to help relieve this discomfort.   Bowels/Hemorrhoids: Drink plenty of water and stay active. Increase fiber, fresh fruits and vegetables in your diet.   Rest/Activity: Rest when the baby is resting  Bathing: Shower daily!  Diet: Continue to eat extra calories until your follow up visit to help replenish nutrients and vitamins. If breastfeeding eat an extra [phone] and increase your fluid intake to 12 glasses a day.   Contraception: Consult with your physician on what method of birth control you would like to use.   Postpartum "BLUES": It is common to emotional days after delivery, however if it persist for greater than 2 weeks or if you feel concerned please let your physician know immediately. This is hormone driven and nothing you can control so please let someone know how  you feel.  Follow Up Visit: Please schedule a follow up visit with your physician.

## 2015-06-25 LAB — RHOGAM INJECTION: UNIT DIVISION: 0

## 2015-08-26 ENCOUNTER — Emergency Department
Admission: EM | Admit: 2015-08-26 | Discharge: 2015-08-27 | Disposition: A | Discharge status: Home / Self Care | Payer: Medicaid Other | Attending: Emergency Medicine | Admitting: Emergency Medicine

## 2015-08-26 DIAGNOSIS — K649 Unspecified hemorrhoids: Secondary | ICD-10-CM | POA: Insufficient documentation

## 2015-08-26 DIAGNOSIS — Z79899 Other long term (current) drug therapy: Secondary | ICD-10-CM | POA: Insufficient documentation

## 2015-08-26 DIAGNOSIS — K625 Hemorrhage of anus and rectum: Secondary | ICD-10-CM | POA: Diagnosis present

## 2015-08-26 LAB — COMPREHENSIVE METABOLIC PANEL
ALT: 67 U/L — ABNORMAL HIGH (ref 14–54)
ANION GAP: 8 (ref 5–15)
AST: 40 U/L (ref 15–41)
Albumin: 3.9 g/dL (ref 3.5–5.0)
Alkaline Phosphatase: 58 U/L (ref 38–126)
BUN: 10 mg/dL (ref 6–20)
CHLORIDE: 105 mmol/L (ref 101–111)
CO2: 25 mmol/L (ref 22–32)
Calcium: 9 mg/dL (ref 8.9–10.3)
Creatinine, Ser: 0.77 mg/dL (ref 0.44–1.00)
GFR calc non Af Amer: >60 mL/min (ref >60)
Glucose, Bld: 107 mg/dL — ABNORMAL HIGH (ref 65–99)
POTASSIUM: 3.6 mmol/L (ref 3.5–5.1)
SODIUM: 138 mmol/L (ref 135–145)
Total Bilirubin: 0.3 mg/dL (ref 0.3–1.2)
Total Protein: 7 g/dL (ref 6.5–8.1)

## 2015-08-26 LAB — CBC
HCT: 37.2 % (ref 35.0–47.0)
HEMOGLOBIN: 12.4 g/dL (ref 12.0–16.0)
MCH: 27.4 pg (ref 26.0–34.0)
MCHC: 33.4 g/dL (ref 32.0–36.0)
MCV: 82.3 fL (ref 80.0–100.0)
Platelets: 190 10*3/uL (ref 150–440)
RBC: 4.52 MIL/uL (ref 3.80–5.20)
RDW: 15.6 % — ABNORMAL HIGH (ref 11.5–14.5)
WBC: 7.7 10*3/uL (ref 3.6–11.0)

## 2015-08-26 NOTE — ED Notes (Addendum)
Pt to ED c/o bleeding hemorrhoid that she noticed tonight. Pt states she has had hemorrhoids since birth of child who is 592 months old. Pain is similar to pain that has always been since hemorrhoids occurred, new sx is the bleeding after BM tonight. Pt was previously using a cream to help with pain. Bleeding only X 1 with BM tonight. Pt alert and oriented X4, active, cooperative, pt in NAD. RR even and unlabored, color WNL.

## 2015-08-27 NOTE — ED Provider Notes (Signed)
[facility] Emergency Department Provider Note  ____________________________________________  Time seen: Approximately 0057AM  I have reviewed the triage vital signs and the nursing notes.   HISTORY  Chief Complaint Hemorrhoids    HPI Vicki Adams is a 20 y.o. female who comes into the hospital with some bleeding from her rectum. The patient reports that she has a history of hemorrhoids and she thinks is ruptured. The patient had a baby on December 31 and reports that she's had hemorrhoids since then. She reports that she has had some bleeding occasionally but today it seemed like a lot more. The patient reports that after she discovered her hemorrhoid she was given a cream but has not been using it recently. Today she had a bowel movement which she reports was not hard and she was not straining but she saw some blood in her stool as well as in the toilet which was concerning. She has some mild pain as a 3 out of 10 in intensity and some mild abdominal cramping. The patient's last menstrual period was approximately one week ago. She reports that the area is still bleeding. The patient was concerned she decided to come into the hospital.   Past Medical History  Diagnosis Date  . 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 Anemia     Patient Active Problem List   Diagnosis Date Noted  . Postpartum care following vaginal delivery 02/18/2015  . History of percutaneous transcatheter closure of congenital ASD 01/24/2015  . History of esophageal hernia repair 01/24/2015    Past Surgical History  Procedure Laterality Date  . Hernia repair    . Cardiac surgery  11/2000  . Hernia repair  07/24/95    esophageal hernia repair done at 21 weeks of age at [facility]    Current Outpatient Rx  Name  Route  Sig  Dispense  Refill  . acetaminophen (TYLENOL) 500 MG tablet   Oral   Take 500 mg by mouth every 6 (six) hours as needed.         . benzocaine-Menthol  (DERMOPLAST) 20-0.5 % AERO   Topical   Apply 1 application topically as needed for irritation (perineal discomfort).         Marland Kitchen HYDROcodone-acetaminophen (NORCO/VICODIN) 5-325 MG tablet   Oral   Take 1-2 tablets by mouth every 6 (six) hours as needed for severe pain.   20 tablet   0   . ibuprofen (ADVIL,MOTRIN) 600 MG tablet   Oral   Take 1 tablet (600 mg total) by mouth every 6 (six) hours as needed.   30 tablet   0   . Prenatal Vit-Fe Fumarate-FA (PRENATAL MULTIVITAMIN) TABS tablet   Oral   Take 1 tablet by mouth daily at 12 noon.           Allergies Erythromycin  Family History  Problem Relation Age of Onset  . Migraines Mother   . Kidney disease Father   . Obesity Father   . Scoliosis Sister   . Emphysema Maternal Grandmother   . Heart failure Maternal Grandmother     Social History Social History  Substance Use Topics  . Smoking status: Never Smoker   . Smokeless tobacco: Never Used  . Alcohol Use: No     Comment: occ    Review of Systems Constitutional: No fever/chills Eyes: No visual changes. ENT: No sore throat. Cardiovascular: Denies chest pain. Respiratory: Denies shortness of breath. Gastrointestinal: No abdominal pain.  No nausea, no vomiting.  No diarrhea.  No constipation. Genitourinary: Rectal bleeding Musculoskeletal: Negative for back pain. Skin: Negative for rash. Neurological: Negative for headaches, focal weakness or numbness.  10-point ROS otherwise negative.  ____________________________________________   PHYSICAL EXAM:  VITAL SIGNS: ED Triage Vitals  Enc Vitals Group     BP 08/26/15 2045 113/49 mmHg     Pulse Rate 08/26/15 2045 72     Resp 08/26/15 2045 20     Temp 08/26/15 2045 98.2 F (36.8 C)     Temp Source 08/26/15 2045 Oral     SpO2 08/26/15 2045 99 %     Weight 08/26/15 2045 169 lb (76.658 kg)     Height 08/26/15 2045 5\' 6"  (1.676 m)     Head Cir --      Peak Flow --      Pain Score 08/26/15 2046 3     Pain  Loc --      Pain Edu? --      Excl. in GC? --     Constitutional: Alert and oriented. Well appearing and in no acute distress. Eyes: Conjunctivae are normal. PERRL. EOMI. Head: Atraumatic. Nose: No congestion/rhinnorhea. Mouth/Throat: Mucous membranes are moist.  Oropharynx non-erythematous. Cardiovascular: Normal rate, regular rhythm. Grossly normal heart sounds.  Good peripheral circulation. Respiratory: Normal respiratory effort.  No retractions. Lungs CTAB. Gastrointestinal: Soft and nontender. No distention. No abdominal bruits.  Genitourinary: Hemorrhoid noted at the cephalad anal verge with a tear and some mild bleeding no thrombosis. Musculoskeletal: No lower extremity tenderness nor edema.  Neurologic:  Normal speech and language.  Skin:  Skin is warm, dry and intact.  Psychiatric: Mood and affect are normal.   ____________________________________________   LABS (all labs ordered are listed, but only abnormal results are displayed)  Labs Reviewed  COMPREHENSIVE METABOLIC PANEL - Abnormal; Notable for the following:    Glucose, Bld 107 (*)    ALT 67 (*)    All other components within normal limits  CBC - Abnormal; Notable for the following:    RDW 15.6 (*)    All other components within normal limits   ____________________________________________  EKG  none ____________________________________________  RADIOLOGY  none ____________________________________________   PROCEDURES  Procedure(s) performed: None  Critical Care performed: No  ____________________________________________   INITIAL IMPRESSION / ASSESSMENT AND PLAN / ED COURSE  Pertinent labs & imaging results that were available during my care of the patient were reviewed by me and considered in my medical decision making (see chart for details).  This is a 20 year old female who comes into the hospital today with hemorrhoids. The patient reports that she noticed some bleeding today and was  concerned that the hemorrhage or ruptured. Upon exam I did notice that the patient does have a tear in her hemorrhoid at the anal deferred but it is not actively bleeding and is not thrombosed. The patient has some minimal pain but nothing severe. I splinted the patient that she probably needs to be placed on stool softeners as well as restart to use her hemorrhoid cream to help shrink her hemorrhoid so that it does not rupture and bleed. I explained to her that there will be some bleeding with the hemorrhoids whenever she has a bowel movement but it should improve in the next few days. I will give her information on sitz bath and have her follow-up with her primary care physician. The patient be discharged home. ____________________________________________   FINAL CLINICAL IMPRESSION(S) / ED DIAGNOSES  Final diagnoses:  Bleeding hemorrhoids      Rebecka Apley, MD 08/27/15 [phone]

## 2015-08-27 NOTE — ED Notes (Signed)
MD Webster at bedside at this time.  

## 2015-08-27 NOTE — Discharge Instructions (Signed)
Hemorrhoids °Hemorrhoids are swollen veins around the rectum or anus. There are two types of hemorrhoids:  °1. Internal hemorrhoids. These occur in the veins just inside the rectum. They may poke through to the outside and become irritated and painful. °2. External hemorrhoids. These occur in the veins outside the anus and can be felt as a painful swelling or hard lump near the anus. °CAUSES °· Pregnancy.   °· Obesity.   °· Constipation or diarrhea.   °· Straining to have a bowel movement.   °· Sitting for long periods on the toilet. °· Heavy lifting or other activity that caused you to strain. °· Anal intercourse. °SYMPTOMS  °· Pain.   °· Anal itching or irritation.   °· Rectal bleeding.   °· Fecal leakage.   °· Anal swelling.   °· One or more lumps around the anus.   °DIAGNOSIS  °Your caregiver may be able to diagnose hemorrhoids by visual examination. Other examinations or tests that may be performed include:  °· Examination of the rectal area with a gloved hand (digital rectal exam).   °· Examination of anal canal using a small tube (scope).   °· A blood test if you have lost a significant amount of blood. °· A test to look inside the colon (sigmoidoscopy or colonoscopy). °TREATMENT °Most hemorrhoids can be treated at home. However, if symptoms do not seem to be getting better or if you have a lot of rectal bleeding, your caregiver may perform a procedure to help make the hemorrhoids get smaller or remove them completely. Possible treatments include:  °· Placing a rubber band at the base of the hemorrhoid to cut off the circulation (rubber band ligation).   °· Injecting a chemical to shrink the hemorrhoid (sclerotherapy).   °· Using a tool to burn the hemorrhoid (infrared light therapy).   °· Surgically removing the hemorrhoid (hemorrhoidectomy).   °· Stapling the hemorrhoid to block blood flow to the tissue (hemorrhoid stapling).   °HOME CARE INSTRUCTIONS  °· Eat foods with fiber, such as whole grains, beans,  nuts, fruits, and vegetables. Ask your doctor about taking products with added fiber in them (fiber supplements). °· Increase fluid intake. Drink enough water and fluids to keep your urine clear or pale yellow.   °· Exercise regularly.   °· Go to the bathroom when you have the urge to have a bowel movement. Do not wait.   °· Avoid straining to have bowel movements.   °· Keep the anal area dry and clean. Use wet toilet paper or moist towelettes after a bowel movement.   °· Medicated creams and suppositories may be used or applied as directed.   °· Only take over-the-counter or prescription medicines as directed by your caregiver.   °· Take warm sitz baths for 15-20 minutes, 3-4 times a day to ease pain and discomfort.   °· Place ice packs on the hemorrhoids if they are tender and swollen. Using ice packs between sitz baths may be helpful.   °¨ Put ice in a plastic bag.   °¨ Place a towel between your skin and the bag.   °¨ Leave the ice on for 15-20 minutes, 3-4 times a day.   °· Do not use a donut-shaped pillow or sit on the toilet for long periods. This increases blood pooling and pain.   °SEEK MEDICAL CARE IF: °· You have increasing pain and swelling that is not controlled by treatment or medicine. °· You have uncontrolled bleeding. °· You have difficulty or you are unable to have a bowel movement. °· You have pain or inflammation outside the area of the hemorrhoids. °MAKE SURE YOU: °· Understand these instructions. °·   Will watch your condition.  Will get help right away if you are not doing well or get worse.   This information is not intended to replace advice given to you by your health care provider. Make sure you discuss any questions you have with your health care provider.   Document Released: 06/05/2000 Document Revised: 05/25/2012 Document Reviewed: 04/12/2012 Elsevier Interactive Patient Education 2016 Elsevier Inc.  High-Fiber Diet Fiber, also called dietary fiber, is a type of carbohydrate  found in fruits, vegetables, whole grains, and beans. A high-fiber diet can have many health benefits. Your health care provider may recommend a high-fiber diet to help: 3. Prevent constipation. Fiber can make your bowel movements more regular. 4. Lower your cholesterol. 5. Relieve hemorrhoids, uncomplicated diverticulosis, or irritable bowel syndrome. 6. Prevent overeating as part of a weight-loss plan. 7. Prevent heart disease, type 2 diabetes, and certain cancers. WHAT IS MY PLAN? The recommended daily intake of fiber includes:  38 grams for men under age 20.  30 grams for men over age 20.  25 grams for women under age 20.  21 grams for women over age 20. You can get the recommended daily intake of dietary fiber by eating a variety of fruits, vegetables, grains, and beans. Your health care provider may also recommend a fiber supplement if it is not possible to get enough fiber through your diet. WHAT DO I NEED TO KNOW ABOUT A HIGH-FIBER DIET?  Fiber supplements have not been widely studied for their effectiveness, so it is better to get fiber through food sources.  Always check the fiber content on thenutrition facts label of any prepackaged food. Look for foods that contain at least 5 grams of fiber per serving.  Ask your dietitian if you have questions about specific foods that are related to your condition, especially if those foods are not listed in the following section.  Increase your daily fiber consumption gradually. Increasing your intake of dietary fiber too quickly may cause bloating, cramping, or gas.  Drink plenty of water. Water helps you to digest fiber. WHAT FOODS CAN I EAT? Grains Whole-grain breads. Multigrain cereal. Oats and oatmeal. Brown rice. Barley. Bulgur wheat. Millet. Bran muffins. Popcorn. Rye wafer crackers. Vegetables Sweet potatoes. Spinach. Kale. Artichokes. Cabbage. Broccoli. Green peas. Carrots. Squash. Fruits Berries. Pears. Apples. Oranges.  Avocados. Prunes and raisins. Dried figs. Meats and Other Protein Sources Navy, kidney, pinto, and soy beans. Split peas. Lentils. Nuts and seeds. Dairy Fiber-fortified yogurt. Beverages Fiber-fortified soy milk. Fiber-fortified orange juice. Other Fiber bars. The items listed above may not be a complete list of recommended foods or beverages. Contact your dietitian for more options. WHAT FOODS ARE NOT RECOMMENDED? Grains White bread. Pasta made with refined flour. White rice. Vegetables Fried potatoes. Canned vegetables. Well-cooked vegetables.  Fruits Fruit juice. Cooked, strained fruit. Meats and Other Protein Sources Fatty cuts of meat. Fried Environmental education officerpoultry or fried fish. Dairy Milk. Yogurt. Cream cheese. Sour cream. Beverages Soft drinks. Other Cakes and pastries. Butter and oils. The items listed above may not be a complete list of foods and beverages to avoid. Contact your dietitian for more information. WHAT ARE SOME TIPS FOR INCLUDING HIGH-FIBER FOODS IN MY DIET?  Eat a wide variety of high-fiber foods.  Make sure that half of all grains consumed each day are whole grains.  Replace breads and cereals made from refined flour or white flour with whole-grain breads and cereals.  Replace white rice with brown rice, bulgur wheat, or millet.  Start  the day with a breakfast that is high in fiber, such as a cereal that contains at least 5 grams of fiber per serving.  Use beans in place of meat in soups, salads, or pasta.  Eat high-fiber snacks, such as berries, raw vegetables, nuts, or popcorn.   This information is not intended to replace advice given to you by your health care provider. Make sure you discuss any questions you have with your health care provider.   Document Released: 06/08/2005 Document Revised: 06/29/2014 Document Reviewed: 11/21/2013 Elsevier Interactive Patient Education [employer]2016 Elsevier Inc.

## 2015-09-23 LAB — OB RESULTS CONSOLE HEPATITIS B SURFACE ANTIGEN: HEP B S AG: NEGATIVE

## 2015-09-23 LAB — OB RESULTS CONSOLE RUBELLA ANTIBODY, IGM: Rubella: IMMUNE

## 2015-09-23 LAB — OB RESULTS CONSOLE HIV ANTIBODY (ROUTINE TESTING): HIV: NONREACTIVE

## 2015-09-23 LAB — OB RESULTS CONSOLE VARICELLA ZOSTER ANTIBODY, IGG: VARICELLA IGG: NON-IMMUNE/NOT IMMUNE

## 2015-11-09 ENCOUNTER — Emergency Department
Admission: EM | Admit: 2015-11-09 | Discharge: 2015-11-09 | Disposition: A | Discharge status: Home / Self Care | Payer: Medicaid Other | Attending: Student | Admitting: Student

## 2015-11-09 ENCOUNTER — Encounter: Payer: Self-pay | Admitting: Emergency Medicine

## 2015-11-09 DIAGNOSIS — Z79899 Other long term (current) drug therapy: Secondary | ICD-10-CM | POA: Diagnosis not present

## 2015-11-09 DIAGNOSIS — R1111 Vomiting without nausea: Secondary | ICD-10-CM

## 2015-11-09 DIAGNOSIS — G43909 Migraine, unspecified, not intractable, without status migrainosus: Secondary | ICD-10-CM | POA: Diagnosis not present

## 2015-11-09 DIAGNOSIS — Z791 Long term (current) use of non-steroidal anti-inflammatories (NSAID): Secondary | ICD-10-CM | POA: Diagnosis not present

## 2015-11-09 LAB — URINALYSIS COMPLETE WITH MICROSCOPIC (ARMC ONLY)
Bilirubin Urine: NEGATIVE
GLUCOSE, UA: NEGATIVE mg/dL
Hgb urine dipstick: NEGATIVE
NITRITE: NEGATIVE
PROTEIN: NEGATIVE mg/dL
SPECIFIC GRAVITY, URINE: 1.023 (ref 1.005–1.030)
pH: 6 (ref 5.0–8.0)

## 2015-11-09 LAB — COMPREHENSIVE METABOLIC PANEL
ALBUMIN: 3.6 g/dL (ref 3.5–5.0)
ALK PHOS: 35 U/L — AB (ref 38–126)
ALT: 16 U/L (ref 14–54)
AST: 20 U/L (ref 15–41)
Anion gap: 8 (ref 5–15)
BILIRUBIN TOTAL: 0.5 mg/dL (ref 0.3–1.2)
BUN: 8 mg/dL (ref 6–20)
CALCIUM: 9.1 mg/dL (ref 8.9–10.3)
CO2: 23 mmol/L (ref 22–32)
CREATININE: 0.61 mg/dL (ref 0.44–1.00)
Chloride: 106 mmol/L (ref 101–111)
GFR calc Af Amer: >60 mL/min (ref >60)
GFR calc non Af Amer: >60 mL/min (ref >60)
GLUCOSE: 81 mg/dL (ref 65–99)
Potassium: 3.8 mmol/L (ref 3.5–5.1)
SODIUM: 137 mmol/L (ref 135–145)
TOTAL PROTEIN: 6.9 g/dL (ref 6.5–8.1)

## 2015-11-09 LAB — LIPASE, BLOOD: LIPASE: 23 U/L (ref 11–51)

## 2015-11-09 LAB — CBC
HCT: 38.1 % (ref 35.0–47.0)
HEMOGLOBIN: 13 g/dL (ref 12.0–16.0)
MCH: 28.4 pg (ref 26.0–34.0)
MCHC: 34.1 g/dL (ref 32.0–36.0)
MCV: 83.4 fL (ref 80.0–100.0)
Platelets: 149 10*3/uL — ABNORMAL LOW (ref 150–440)
RBC: 4.57 MIL/uL (ref 3.80–5.20)
RDW: 13.5 % (ref 11.5–14.5)
WBC: 8.2 10*3/uL (ref 3.6–11.0)

## 2015-11-09 MED ORDER — PROCHLORPERAZINE EDISYLATE 5 MG/ML IJ SOLN
10.0000 mg | Freq: Once | INTRAMUSCULAR | Status: AC
  Administered 2015-11-09: 10 mg via INTRAVENOUS
  Filled 2015-11-09: qty 2

## 2015-11-09 MED ORDER — MORPHINE SULFATE (PF) 2 MG/ML IV SOLN
2.0000 mg | Freq: Once | INTRAVENOUS | Status: AC
  Administered 2015-11-09: 2 mg via INTRAVENOUS
  Filled 2015-11-09: qty 1

## 2015-11-09 MED ORDER — SODIUM CHLORIDE 0.9 % IV BOLUS (SEPSIS)
1000.0000 mL | Freq: Once | INTRAVENOUS | Status: AC
  Administered 2015-11-09: 1000 mL via INTRAVENOUS

## 2015-11-09 MED ORDER — DIPHENHYDRAMINE HCL 50 MG/ML IJ SOLN
12.5000 mg | Freq: Once | INTRAMUSCULAR | Status: AC
  Administered 2015-11-09: 12.5 mg via INTRAVENOUS
  Filled 2015-11-09: qty 1

## 2015-11-09 MED ORDER — ONDANSETRON HCL 4 MG/2ML IJ SOLN: 4.0000 mg | Freq: Once | INTRAMUSCULAR | Status: DC

## 2015-11-09 NOTE — ED Notes (Signed)
Pt given water and crackers. Pt able to tolerate food and fluid well.

## 2015-11-09 NOTE — ED Provider Notes (Addendum)
[facility] Emergency Department Provider Note   ____________________________________________  Time seen: Approximately 4:30 PM  I have reviewed the triage vital signs and the nursing notes.   HISTORY  Chief Complaint Migraine and Vomiting    HPI Vicki Adams is a 20 y.o. female with history of migraines, G2 P1 at approximately 12 weeks estimated gestational age followed by [facility] who presents for evaluation of 3 days of her typical migraine headache, gradual onset, constant, no modifying factors, mild to moderate. Patient rates her pain as a 3 out of 10 on the pain scale, reports that is consistent in severity and character to her usual migraines. She reports that today she has also had 2 episodes of nausea with nonbloody nonbilious emesis. No abdominal pain or diarrhea, no fevers or chills. No chest pain or difficulty breathing. No abnormal vaginal bleeding or vaginal discharge.   Past Medical History  Diagnosis Date  . 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 Anemia     Patient Active Problem List   Diagnosis Date Noted  . Postpartum care following vaginal delivery 02/18/2015  . History of percutaneous transcatheter closure of congenital ASD 01/24/2015  . History of esophageal hernia repair 01/24/2015    Past Surgical History  Procedure Laterality Date  . Hernia repair    . Cardiac surgery  11/2000  . Hernia repair  03/25/1996    esophageal hernia repair done at 22 weeks of age at [facility]    Current Outpatient Rx  Name  Route  Sig  Dispense  Refill  . acetaminophen (TYLENOL) 325 MG tablet   Oral   Take 650 mg by mouth every 6 (six) hours as needed for mild pain, moderate pain, fever or headache.         . Prenatal Vit-Fe Fumarate-FA (PRENATAL MULTIVITAMIN) TABS tablet   Oral   Take 1 tablet by mouth daily.          . benzocaine-Menthol (DERMOPLAST) 20-0.5 % AERO   Topical   Apply 1 application topically as needed for  irritation (perineal discomfort).         Marland Kitchen HYDROcodone-acetaminophen (NORCO/VICODIN) 5-325 MG tablet   Oral   Take 1-2 tablets by mouth every 6 (six) hours as needed for severe pain.   20 tablet   0   . ibuprofen (ADVIL,MOTRIN) 600 MG tablet   Oral   Take 1 tablet (600 mg total) by mouth every 6 (six) hours as needed.   30 tablet   0     Allergies Erythromycin  Family History  Problem Relation Age of Onset  . Migraines Mother   . Kidney disease Father   . Obesity Father   . Scoliosis Sister   . Emphysema Maternal Grandmother   . Heart failure Maternal Grandmother     Social History Social History  Substance Use Topics  . Smoking status: Never Smoker   . Smokeless tobacco: Never Used  . Alcohol Use: No     Comment: occ    Review of Systems Constitutional: No fever/chills Eyes: No visual changes. ENT: No sore throat. Cardiovascular: Denies chest pain. Respiratory: Denies shortness of breath. Gastrointestinal: No abdominal pain.  + nausea, +vomiting.  No diarrhea.  No constipation. Genitourinary: Negative for dysuria. Musculoskeletal: Negative for back pain. Skin: Negative for rash. Neurological: Positive for headache, no focal weakness or numbness.  10-point ROS otherwise negative.  ____________________________________________   PHYSICAL EXAM:  Filed Vitals:   11/09/15  1356 11/09/15 1654 11/09/15 1845  BP: 118/67 94/51 102/60  Pulse: 77 73 83  Temp: 98.3 F (36.8 C)    TempSrc: Oral    Resp: 18 18 16   Height: 5\' 6"  (1.676 m)    Weight: 176 lb (79.833 kg)    SpO2: 99% 100% 100%    VITAL SIGNS: ED Triage Vitals  Enc Vitals Group     BP 11/09/15 1356 118/67 mmHg     Pulse Rate 11/09/15 1356 77     Resp 11/09/15 1356 18     Temp 11/09/15 1356 98.3 F (36.8 C)     Temp Source 11/09/15 1356 Oral     SpO2 11/09/15 1356 99 %     Weight 11/09/15 1356 176 lb (79.833 kg)     Height 11/09/15 1356 5\' 6"  (1.676 m)     Head Cir --      Peak Flow --       Pain Score 11/09/15 1357 6     Pain Loc --      Pain Edu? --      Excl. in GC? --     Constitutional: Alert and oriented. Well appearing and in no acute distress. Eyes: Conjunctivae are normal. PERRL. EOMI. Head: Atraumatic. Nose: No congestion/rhinnorhea. Mouth/Throat: Mucous membranes are moist.  Oropharynx non-erythematous. Neck: No stridor.  Supple without meningismus. Cardiovascular: Normal rate, regular rhythm. Grossly normal heart sounds.  Good peripheral circulation. Respiratory: Normal respiratory effort.  No retractions. Lungs CTAB. Gastrointestinal: Soft and nontender. No distention. No CVA tenderness. Genitourinary: deferred Musculoskeletal: No lower extremity tenderness nor edema.  No joint effusions. Neurologic:  Normal speech and language. No gross focal neurologic deficits are appreciated. No gait instability. Skin:  Skin is warm, dry and intact. No rash noted. Psychiatric: Mood and affect are normal. Speech and behavior are normal.  ____________________________________________   LABS (all labs ordered are listed, but only abnormal results are displayed)  Labs Reviewed  COMPREHENSIVE METABOLIC PANEL - Abnormal; Notable for the following:    Alkaline Phosphatase 35 (*)    All other components within normal limits  CBC - Abnormal; Notable for the following:    Platelets 149 (*)    All other components within normal limits  URINALYSIS COMPLETEWITH MICROSCOPIC ([facility] ONLY) - Abnormal; Notable for the following:    Color, Urine YELLOW (*)    APPearance HAZY (*)    Ketones, ur TRACE (*)    Leukocytes, UA TRACE (*)    Bacteria, UA RARE (*)    Squamous Epithelial / LPF 6-30 (*)    All other components within normal limits  LIPASE, BLOOD   ____________________________________________  EKG  none ____________________________________________  RADIOLOGY  Limited emergency department bedside ultrasound performed by me shows single live intrauterine  pregnancy with fetal heart rate 176 bpm. ____________________________________________   PROCEDURES  Procedure(s) performed: None  Critical Care performed: No  ____________________________________________   INITIAL IMPRESSION / ASSESSMENT AND PLAN / ED COURSE  Pertinent labs & imaging results that were available during my care of the patient were reviewed by me and considered in my medical decision making (see chart for details).  Reuel BoomCheyenne Tona Sensingicole Porter is a 20 y.o. female with history of migraines, G2 P1 at approximately 12 weeks estimated gestational age [facility] who presents for evaluation of 3 days of her typical migraine headache as well as nausea and vomiting today. On exam, she is generally well-appearing and in no acute distress, vital signs stable, she is afebrile.  She has a benign abdominal exam, neck supple without meningismus, intact neurological exam. Doubt subarachnoid hemorrhage or meningitis. Suspect that this is related to her usual migraine headache, we'll treat with migraine cocktail, obtain screening labs, reassess for disposition.  ----------------------------------------- 6:39 PM on 11/09/2015 ----------------------------------------- CBC and CMP unremarkable. Normal lipase. Urinalysis is not consistent with infection. Patient reports complete resolution of her headache at this time, she is sitting up in bed, eating saltine crackers, watching TV, requesting that her IV be removed and she be discharged. We discussed return precautions, need for close OB/GYN follow-up and she is comfortable with the discharge plan. DC home.  ____________________________________________   FINAL CLINICAL IMPRESSION(S) / ED DIAGNOSES  Final diagnoses:  Migraine without status migrainosus, not intractable, unspecified migraine type  Non-intractable vomiting without nausea, unspecified vomiting type      NEW MEDICATIONS STARTED DURING THIS VISIT:  New  Prescriptions   No medications on file     Note:  This document was prepared using Dragon voice recognition software and may include unintentional dictation errors.    Gayla Doss, MD 11/09/15 0102  Gayla Doss, MD 11/09/15 7253

## 2015-11-09 NOTE — ED Notes (Signed)
Pt here with migraine paint that began on Thursday, has taken Tylenol for pain with no relief, vomiting today and cannot eat or drink, [redacted] weeks pregnant.

## 2016-01-23 ENCOUNTER — Ambulatory Visit
Admission: RE | Admit: 2016-01-23 | Discharge: 2016-01-23 | Disposition: A | Discharge status: Home / Self Care | Payer: Medicaid Other | Source: Ambulatory Visit | Attending: Obstetrics & Gynecology | Admitting: Obstetrics & Gynecology

## 2016-01-23 VITALS — BP 118/64 | HR 91 | Temp 98.2°F | Resp 18 | Wt 181.6 lb

## 2016-01-23 DIAGNOSIS — Z8719 Personal history of other diseases of the digestive system: Secondary | ICD-10-CM

## 2016-01-23 DIAGNOSIS — O99412 Diseases of the circulatory system complicating pregnancy, second trimester: Secondary | ICD-10-CM | POA: Diagnosis not present

## 2016-01-23 DIAGNOSIS — Q249 Congenital malformation of heart, unspecified: Secondary | ICD-10-CM

## 2016-01-23 DIAGNOSIS — Z9889 Other specified postprocedural states: Secondary | ICD-10-CM

## 2016-01-23 DIAGNOSIS — Z8774 Personal history of (corrected) congenital malformations of heart and circulatory system: Secondary | ICD-10-CM | POA: Diagnosis not present

## 2016-01-23 DIAGNOSIS — O99419 Diseases of the circulatory system complicating pregnancy, unspecified trimester: Secondary | ICD-10-CM | POA: Insufficient documentation

## 2016-01-23 NOTE — Progress Notes (Signed)
Maternal-Fetal Medicine Consultation. Vicki Adams is a 20 year-old G2 P1001 at 63 6/7 weeks with [facility] provided by Domingo Pulse of [facility] presents for consultation.  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  She was seen by our group and by Dr. Teryl Lucy of [facility] during her recent pregnancy.  She had a normal cardiac echo and uncomplicated prenatal and delivery course.  Since then, she reports that she is doing well. She had an episode of back pain which is now resolved. She denies shortness of breath, palpitations or chest pain. She has no abdominal pain, vaginal bleeding and reports fetal movement.  PMH: ASD s/p repair PSH: Percutaneous ASD repair. Hernia repair shortly after birth.  PObH: G2 P1001.     2016: Uncomplicated SVD at 40 weeks. Uncomplicated pregnancy.  Female infant, weight 3180g.   PGyn: History of chlamydia that was treated Meds; PNV All: Erythromycin SH: Single but father-of-the-baby is involved. Works at [employer].  No tobacco or drug use FH: Denies FH of birth defects (except ASD in self), mental retardation or genetic disorders ROS: See HPI  Exam: Vitals:   01/23/16 1027  BP: 118/64  Pulse: 91  Resp: 18  Temp: 98.2 F (36.8 C)   No further examination performed See MFM consult dated 11/29/14 and Prenatal records from [place].  Prenatal labs [facility], 09/23/15): Blood type O negative, antibody screen POSITIVE for D (too weak to titer), RPR non-reactive, Varicella non-immune, Hep B neg, Rubella immune, hct 34.1, MCV 82, Gc/Chl neg, HIV non-reactive.  Cell-free fetal DNA testing (11/14/15): Low risk for aneuploidy  MS-AFP (12/04/15): Negative   Repeat antibody screen (11/07/15): negative Repeat antibody screen (12/04/15): negative  Anatomy US ([facility], 01/02/16, images not reviewed):  Anatomy reported as normal except heart and face not well-seen. Posterior placenta  previa.  Maternal echo [facility], 05/21/15): Normal LV function, normal LA pressures, normal diastolic function, normal right ventricular function.  Mild PR and TR. No valvular stenosis.  Stable ASD occulder device seen with trivial residual shunt.  Assessment: 20 year-old G2 P1001 at 25 6/7 weeks with [facility] provided by Consuella Lose here for MFM consultation. Pt has a history of repaired [facility] at birth and then a percutaneous repair of an ASD in 2002.  She has done well since closure of her ASD and tolerated pregnancy last year without difficulty. During her last pregnancy, she was seen by Dr. Georga Hacking Ward [facility] and had a normal fetal echo.  Recommendations: -We have scheduled her for a fetal echo with [facility] and detailed [place] with [facility].  Vicki Adams has a 3-5% risk of having a child with congenital heart disease given her history.  This appointments have been scheduled -Continue daily baby aspirin for the entire pregnancy and through the postpartum period. See full MFM consult dated 11/29/14 by Dr. Fayrene Fearing. We do not need to repeat the maternal echo and there are no maternal cardiac concerns for delivery -Low-positive antibody screen for D with a titer too weak too titer. Followup titers have been negative. Continue to check monthly through the pregnancy  Kima Malenfant, [place] A, MD

## 2016-02-01 ENCOUNTER — Emergency Department
Admission: EM | Admit: 2016-02-01 | Discharge: 2016-02-01 | Disposition: A | Discharge status: Home / Self Care | Payer: Medicaid Other | Attending: Student in an Organized Health Care Education/Training Program | Admitting: Student in an Organized Health Care Education/Training Program

## 2016-02-01 DIAGNOSIS — O9989 Other specified diseases and conditions complicating pregnancy, childbirth and the puerperium: Secondary | ICD-10-CM | POA: Insufficient documentation

## 2016-02-01 DIAGNOSIS — R55 Syncope and collapse: Secondary | ICD-10-CM

## 2016-02-01 DIAGNOSIS — Z3A24 24 weeks gestation of pregnancy: Secondary | ICD-10-CM | POA: Insufficient documentation

## 2016-02-01 DIAGNOSIS — Z3492 Encounter for supervision of normal pregnancy, unspecified, second trimester: Secondary | ICD-10-CM

## 2016-02-01 LAB — CBC
HCT: 31 % — ABNORMAL LOW (ref 35.0–47.0)
HEMOGLOBIN: 10.8 g/dL — AB (ref 12.0–16.0)
MCH: 29.1 pg (ref 26.0–34.0)
MCHC: 34.9 g/dL (ref 32.0–36.0)
MCV: 83.6 fL (ref 80.0–100.0)
PLATELETS: 155 10*3/uL (ref 150–440)
RBC: 3.7 MIL/uL — AB (ref 3.80–5.20)
RDW: 15 % — ABNORMAL HIGH (ref 11.5–14.5)
WBC: 11.8 10*3/uL — ABNORMAL HIGH (ref 3.6–11.0)

## 2016-02-01 LAB — COMPREHENSIVE METABOLIC PANEL
ALBUMIN: 3 g/dL — AB (ref 3.5–5.0)
ALK PHOS: 48 U/L (ref 38–126)
ALT: 8 U/L — AB (ref 14–54)
ANION GAP: 7 (ref 5–15)
AST: 17 U/L (ref 15–41)
BUN: 7 mg/dL (ref 6–20)
CALCIUM: 8.6 mg/dL — AB (ref 8.9–10.3)
CHLORIDE: 107 mmol/L (ref 101–111)
CO2: 22 mmol/L (ref 22–32)
CREATININE: 0.5 mg/dL (ref 0.44–1.00)
GFR calc Af Amer: >60 mL/min (ref >60)
GFR calc non Af Amer: >60 mL/min (ref >60)
GLUCOSE: 89 mg/dL (ref 65–99)
Potassium: 3.8 mmol/L (ref 3.5–5.1)
SODIUM: 136 mmol/L (ref 135–145)
Total Bilirubin: 0.7 mg/dL (ref 0.3–1.2)
Total Protein: 6.4 g/dL — ABNORMAL LOW (ref 6.5–8.1)

## 2016-02-01 LAB — GLUCOSE, CAPILLARY: Glucose-Capillary: 93 mg/dL (ref 65–99)

## 2016-02-01 MED ORDER — SODIUM CHLORIDE 0.9 % IV BOLUS (SEPSIS)
1000.0000 mL | Freq: Once | INTRAVENOUS | Status: AC
  Administered 2016-02-01: 1000 mL via INTRAVENOUS

## 2016-02-01 NOTE — ED Provider Notes (Signed)
[facility] Emergency Department Provider Note    First MD Initiated Contact with Patient 02/01/16 1147     (approximate)  I have reviewed the triage vital signs and the nursing notes.   HISTORY  Chief Complaint Loss of Consciousness    HPI Vicki Adams is a 20 y.o. female is [redacted] weeks pregnant presents with a syncopal episode while at work today. Patient states that she was standing in line in the bathroom became dizzy and lightheaded and fell to the floor.  She states that the other person's dog came out and asked her if she needed any help at that point she still felt lightheaded and faint. The bystander called EMS and brought the patient to the ER for further evaluation and management. She currently states that she has no symptoms. She denies any chest pain or shortness of breath. No leg swelling. No history of seizures. She did not bite her tongue or lose control of her bowel or bladder. Denies a headache. States that she ate breakfast but was having a hard day at work. Similar episode happened several weeks ago. She does not have any history of heart disease or dysrhythmia.   Past Medical History:  Diagnosis Date  . Anemia   . 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 Congenital heart disease, maternal, antepartum 01/23/2016  . Postpartum care following vaginal delivery 02/18/2015  . History of percutaneous transcatheter closure of congenital ASD 01/24/2015  . History of esophageal hernia repair 01/24/2015    Past Surgical History:  Procedure Laterality Date  . CARDIAC SURGERY  11/2000  . HERNIA REPAIR    . HERNIA REPAIR  02/15/96   esophageal hernia repair done at 63 weeks of age at [facility]    Prior to Admission medications   Medication Sig Start Date End Date Taking? Authorizing Provider  acetaminophen (TYLENOL) 325 MG tablet Take 650 mg by mouth every 6 (six) hours as needed for mild  pain, moderate pain, fever or headache.    Historical Provider, MD  Prenatal Vit-Fe Fumarate-FA (PRENATAL MULTIVITAMIN) TABS tablet Take 1 tablet by mouth daily.     Historical Provider, MD    Allergies Erythromycin  Family History  Problem Relation Age of Onset  . Migraines Mother   . Kidney disease Father   . Obesity Father   . Scoliosis Sister   . Emphysema Maternal Grandmother   . Heart failure Maternal Grandmother     Social History Social History  Substance Use Topics  . Smoking status: Never Smoker  . Smokeless tobacco: Never Used  . Alcohol use No     Comment: occ    Review of Systems Patient denies headaches, rhinorrhea, blurry vision, numbness, shortness of breath, chest pain, edema, cough, abdominal pain, nausea, vomiting, diarrhea, dysuria, fevers, rashes or hallucinations unless otherwise stated above in HPI. ____________________________________________   PHYSICAL EXAM:  VITAL SIGNS: Vitals:   02/01/16 1400 02/01/16 1433  BP: 109/66   Pulse:    Resp: (!) 21   Temp:  97.9 F (36.6 C)    Constitutional: Alert and oriented. Well appearing and in no acute distress. Eyes: Conjunctivae are normal. PERRL. EOMI. Head: Atraumatic. Nose: No congestion/rhinnorhea. Mouth/Throat: Mucous membranes are moist.  Oropharynx non-erythematous. Neck: No stridor. Painless ROM. No cervical spine tenderness to palpation Hematological/Lymphatic/Immunilogical: No cervical lymphadenopathy. Cardiovascular: Normal rate, regular rhythm. Grossly normal heart sounds.  Good peripheral  circulation. Respiratory: Normal respiratory effort.  No retractions. Lungs CTAB. Gastrointestinal: Gravid Soft and nontender. No distention. No abdominal bruits. No CVA tenderness. Musculoskeletal: No lower extremity tenderness nor edema.  No joint effusions. Neurologic:  Normal speech and language. No gross focal neurologic deficits are appreciated. No gait instability. No facial droop Skin:  Skin  is warm, dry and intact. No rash noted. Psychiatric: Mood and affect are normal. Speech and behavior are normal.  ____________________________________________   LABS (all labs ordered are listed, but only abnormal results are displayed)  Results for orders placed or performed during the hospital encounter of 02/01/16 (from the past 24 hour(s))  Glucose, capillary     Status: None   Collection Time: 02/01/16 11:47 AM  Result Value Ref Range   Glucose-Capillary 93 65 - 99 mg/dL  CBC     Status: Abnormal   Collection Time: 02/01/16 12:16 PM  Result Value Ref Range   WBC 11.8 (H) 3.6 - 11.0 K/uL   RBC 3.70 (L) 3.80 - 5.20 MIL/uL   Hemoglobin 10.8 (L) 12.0 - 16.0 g/dL   HCT 16.1 (L) 09.6 - 04.5 %   MCV 83.6 80.0 - 100.0 fL   MCH 29.1 26.0 - 34.0 pg   MCHC 34.9 32.0 - 36.0 g/dL   RDW 40.9 (H) 81.1 - 91.4 %   Platelets 155 150 - 440 K/uL  Comprehensive metabolic panel     Status: Abnormal   Collection Time: 02/01/16 12:16 PM  Result Value Ref Range   Sodium 136 135 - 145 mmol/L   Potassium 3.8 3.5 - 5.1 mmol/L   Chloride 107 101 - 111 mmol/L   CO2 22 22 - 32 mmol/L   Glucose, Bld 89 65 - 99 mg/dL   BUN 7 6 - 20 mg/dL   Creatinine, Ser 7.82 0.44 - 1.00 mg/dL   Calcium 8.6 (L) 8.9 - 10.3 mg/dL   Total Protein 6.4 (L) 6.5 - 8.1 g/dL   Albumin 3.0 (L) 3.5 - 5.0 g/dL   AST 17 15 - 41 U/L   ALT 8 (L) 14 - 54 U/L   Alkaline Phosphatase 48 38 - 126 U/L   Total Bilirubin 0.7 0.3 - 1.2 mg/dL   GFR calc non Af Amer >60 >60 mL/min   GFR calc Af Amer >60 >60 mL/min   Anion gap 7 5 - 15   ____________________________________________  EKG  Time: 11:44  Indication: syncope  Rate: nsr  At 75 Axis: normal Other: no preexcitation syndrome or pattern ____________________________________________   ____________________________________________   PROCEDURES  Procedure(s) performed: none    Critical Care performed: no ____________________________________________   INITIAL  IMPRESSION / ASSESSMENT AND PLAN / ED COURSE  Pertinent labs & imaging results that were available during my care of the patient were reviewed by me and considered in my medical decision making (see chart for details).  DDX: Dehydration, dysrhythmia, heart failure, hypoglycemia, vasovagal, seizure  Vicki Adams is a 20 y.o. who presents to the ED with syncopal episode at [redacted] weeks pregnant presents hemodynamically stable and asymptomatic. No evidence of head trauma. She has a nonfocal neuro exam. Do not feel CT imaging of the head clinically indicated. EKG shows no evidence of acute ischemia or preexcitation syndrome such as WPW or Brugada. She has no murmurs on exam. Her abdominal exam is soft and benign. Will provide IV fluids as well as check labs and bedside ultrasound to assess for fetal heart tones.  Clinical Course  Value Comment By  Time  Glucose: 89 Bedside ultrasound was reassuring fetal heart movement and fetal heart tones. Patient asymptomatic and hemodynamically stable. She is tolerating orange juice. Denies any shortness of breath or chest pain. She will amulet with a steady gait without symptoms. Feel this presentation more consistent with dehydration and possible vasovagal episode. Not clinically consistent with seizures. Not clinically consistent with dysrhythmia. The patient is stable for follow-up with her PCP. Willy EddyPatrick Kirstin Kugler, MD 08/12 1415     ____________________________________________   FINAL CLINICAL IMPRESSION(S) / ED DIAGNOSES  Final diagnoses:  Fainting spell  Pregnant and not yet delivered in second trimester      NEW MEDICATIONS STARTED DURING THIS VISIT:  Discharge Medication List as of 02/01/2016  2:17 PM       Note:  This document was prepared using Dragon voice recognition software and may include unintentional dictation errors.    Willy EddyPatrick Amanat Hackel, MD 02/01/16 [phone]

## 2016-02-01 NOTE — ED Triage Notes (Signed)
Pt is [redacted] weeks pregnant. Passed out today at work at around 0800 while waiting to use the bathroom. States she remembers waiting for the stall to use the bathroom then woke up on the floor. Denies Pain.

## 2016-02-01 NOTE — ED Notes (Signed)
IV REMOVED LEFT AC  LM EDT

## 2016-02-06 ENCOUNTER — Encounter: Payer: Self-pay | Admitting: *Deleted

## 2016-02-06 ENCOUNTER — Ambulatory Visit
Admission: RE | Admit: 2016-02-06 | Discharge: 2016-02-06 | Disposition: A | Discharge status: Home / Self Care | Payer: Medicaid Other | Source: Ambulatory Visit | Attending: Maternal & Fetal Medicine | Admitting: Maternal & Fetal Medicine

## 2016-02-06 DIAGNOSIS — Z3A24 24 weeks gestation of pregnancy: Secondary | ICD-10-CM | POA: Diagnosis not present

## 2016-02-06 DIAGNOSIS — Q249 Congenital malformation of heart, unspecified: Secondary | ICD-10-CM | POA: Insufficient documentation

## 2016-02-06 DIAGNOSIS — O99412 Diseases of the circulatory system complicating pregnancy, second trimester: Secondary | ICD-10-CM | POA: Insufficient documentation

## 2016-02-19 ENCOUNTER — Encounter: Payer: Self-pay | Admitting: Cardiology

## 2016-02-19 ENCOUNTER — Ambulatory Visit (INDEPENDENT_AMBULATORY_CARE_PROVIDER_SITE_OTHER): Payer: Medicaid Other | Admitting: Cardiology

## 2016-02-19 ENCOUNTER — Encounter: Payer: Self-pay | Admitting: *Deleted

## 2016-02-19 VITALS — BP 124/66 | HR 96 | Ht 65.0 in | Wt 185.5 lb

## 2016-02-19 DIAGNOSIS — R002 Palpitations: Secondary | ICD-10-CM | POA: Diagnosis not present

## 2016-02-19 DIAGNOSIS — Q249 Congenital malformation of heart, unspecified: Secondary | ICD-10-CM | POA: Diagnosis not present

## 2016-02-19 DIAGNOSIS — R55 Syncope and collapse: Secondary | ICD-10-CM | POA: Diagnosis not present

## 2016-02-19 DIAGNOSIS — O99412 Diseases of the circulatory system complicating pregnancy, second trimester: Secondary | ICD-10-CM

## 2016-02-19 NOTE — Progress Notes (Signed)
Cardiology Office Note   Date:  02/19/2016   ID:  Vicki Adams, DOB 10/19/1995, MRN [id number]  Referring Doctor:  Lorrene ReidSTAEBLER, ANDREAS M, MD   Cardiologist:   Almond LintAileen Jannie Doyle, MD   Reason for consultation:  Chief Complaint  Patient presents with  . Other    Syncope and rapid heart beat. Meds reviewed verbally with pt.      History of Present Illness: Vicki Adams is a 20 y.o. female who presents for Episodes of racing heart, lightheadedness and passing out. She is approximately [redacted] weeks pregnant.  Her symptoms started end of July. She works at [employer]. She comes in to work at around 7 AM. She is mostly on her feet all day. At that time, roughly an hour after start of work, she went to get something from the fridge, felt lightheaded and warm all over started sweating. The next thing she knew, she was down on the food blower after passing out. She was only out for a second or so. She did not have any chest pains or shortness of breath. She just felt her heart racing after the episode. She was told to go home. She went to the ER at that time.  Another episode happened 02/08/2016. Similar episode, she had been at work for probably an hour or so. She was waiting outside the bathroom and leaning against the wall waiting for her turn. The next thing she no she was on the floor and the person came out of the bathroom to help her get up. She was out again for a brief moment, a second or so. No chest pains, shortness of breath. Again heart was racing during that time. She went to the ER. They told her that her symptoms were similar to dehydration.  Patient reports that this is her second pregnancy. First pregnancy was unremarkable. She does have a history of ASD that was treated with an occluder at age 55. She had been doing well otherwise. No fever, cough, colds, abdominal pain.  Patient reports that the she has been eating okay. She may be drinks a lot of soda throughout the  day, at least 8 Pepsi's a day. She also drinks tea.   ROS:  Please see the history of present illness. Aside from mentioned under HPI, all other systems are reviewed and negative.     Past Medical History:  Diagnosis Date  . Anemia   . Secundum ASD    [facility]  . Syncope and collapse     Past Surgical History:  Procedure Laterality Date  . CARDIAC SURGERY  11/2000  . HERNIA REPAIR    . HERNIA REPAIR  06/1995   esophageal her[facility] repair done at 92 [facility] age [facility]     reports that she has never smoked. She has never used smokeless tobacco. She reports that she does not drink alcohol or use drugs.   family history includes Emphysema in her maternal grandmother; Heart failure in her father and maternal grandmother; Kidney disease in her father; Migraines in her mother; Obesity in her father; Scoliosis in her sister.   Outpatient Medications Prior to Visit  Medication Sig Dispense Refill  . acetaminophen (TYLENOL) 325 MG tablet Take 650 mg by mouth every 6 (six) hours as needed for mild pain, moderate pain, fever or headache.    . Prenatal Vit-Fe Fumarate-FA (PRENATAL MULTIVITAMIN) TABS tablet Take 1 tablet by mouth daily.      No facility-administered medications  prior to visit.      Allergies: Erythromycin    PHYSICAL EXAM: VS:  BP 124/66 (BP Location: Right Arm, Patient Position: Sitting, Cuff Size: Normal)   Pulse 96   Ht 5\' 5"  (1.651 m)   Wt 185 lb 8 oz (84.1 kg)   LMP 08/23/2015   BMI 30.87 kg/m  , Body mass index is 30.87 kg/m. Wt Readings from Last 3 Encounters:  02/19/16 185 lb 8 oz (84.1 kg)  02/06/16 183 lb (83 kg)  02/01/16 180 lb (81.6 kg)    GENERAL:  well developed, well nourished, not in acute distress HEENT: normocephalic, pink conjunctivae, anicteric sclerae, no xanthelasma, normal dentition, oropharynx clear NECK:  no neck vein engorgement, JVP normal, no hepatojugular reflux, carotid upstroke brisk and symmetric, no bruit, no  thyromegaly, no lymphadenopathy LUNGS:  good respiratory effort, clear to auscultation bilaterally CV:  PMI not displaced, no thrills, no lifts, S1 and S2 within normal limits, no palpable S3 or S4, no murmurs, no rubs, no gallops ABD:  Soft, nontender, curently pregnant, normoactive bowel sounds MS: nontender back, no kyphosis, no scoliosis, no joint deformities EXT:  2+ DP/PT pulses, no edema, no varicosities, no cyanosis, no clubbing SKIN: warm, nondiaphoretic, normal turgor, no ulcers NEUROPSYCH: alert, oriented to person, place, and time, sensory/motor grossly intact, normal mood, appropriate affect  Recent Labs: 02/01/2016: ALT 8; BUN 7; Creatinine, Ser 0.50; Hemoglobin 10.8; Platelets 155; Potassium 3.8; Sodium 136   Lipid Panel No results found for: CHOL, TRIG, HDL, CHOLHDL, VLDL, LDLCALC, LDLDIRECT   Other studies Reviewed:  EKG:  The ekg from 02/19/2016 was personally reviewed by me and it revealed sinus rhythm, 96 BPM  Additional studies/ records that were reviewed personally reviewed by me today include:  Echo 05/21/2015: NORMAL LEFT VENTRICULAR SYSTOLIC FUNCTION NORMAL LA PRESSURES WITH NORMAL DIASTOLIC FUNCTION NORMAL RIGHT VENTRICULAR SYSTOLIC FUNCTION VALVULAR REGURGITATION: MILD PR, TRIVIAL TR NO VALVULAR STENOSIS STABLE ASD OCCLUDER DEVICE WITH TRIVIAL RESIDUAL SHUNT BY COLOR DOPPLER Compared with prior Echo study on 08/28/1999: NORMAL RV SIZE ASD OCCLUDER DEVICE IS PRESENT  ASSESSMENT AND PLAN: Episodes of feeling faint and passing out, heart racing Her history is consistent with orthostatic hypotension Patient advised to increase fluid intake, avoid excessive soda intake, avoid tea intake. Patient will also benefit from compression hoses. Avoid prolonged standing. Letter to be given to patient to present to employer. Recommend echocardiogram. Patient may follow-up in our office in 3-4 weeks if symptoms persist despite the above mentioned  recommendations. May do a Holter at that time.  ASD s/p occluder Check echocardiogram.  Current medicines are reviewed at length with the patient today.  The patient does not have concerns regarding medicines.  Labs/ tests ordered today include:  Orders Placed This Encounter  Procedures  . EKG 12-Lead    I had a lengthy and detailed discussion with the patient regarding diagnoses, prognosis, diagnostic options, treatment options .   I counseled the patient on importance of lifestyle modification including heart healthy diet, regular physical activity .   Disposition:   FU with undersigned after tests prn  I spent at least 45 minutes with the patient today and more than 50% of the time was spent counseling the patient and coordinating care.     Signed, Almond Lint, MD  02/19/2016 2:37 PM    [facility]  This note was generated in part with voice recognition software and I apologize for any typographical errors that were not detected and corrected.

## 2016-02-19 NOTE — Patient Instructions (Addendum)
Testing/Procedures: Your physician has requested that you have an echocardiogram. Echocardiography is a painless test that uses sound waves to create images of your heart. It provides your doctor with information about the size and shape of your heart and how well your heart's chambers and valves are working. This procedure takes approximately one hour. There are no restrictions for this procedure.    Follow-Up: Your physician recommends that you schedule a follow-up appointment as needed. If you continue to have symptoms in 3-4 weeks then call and we can order holter monitor and schedule you for follow up.   Try wearing compression hose while at work and letter provided to avoid prolonged standing.   It was a pleasure seeing you today here in the office. Please do not hesitate to give us a call back if you have any further questions. [phone]   CellarPamela A. RN, BSN

## 2016-02-20 ENCOUNTER — Other Ambulatory Visit: Payer: Self-pay

## 2016-02-20 ENCOUNTER — Ambulatory Visit (INDEPENDENT_AMBULATORY_CARE_PROVIDER_SITE_OTHER): Payer: Medicaid Other

## 2016-02-20 DIAGNOSIS — R55 Syncope and collapse: Secondary | ICD-10-CM

## 2016-02-20 DIAGNOSIS — Q249 Congenital malformation of heart, unspecified: Secondary | ICD-10-CM

## 2016-02-20 DIAGNOSIS — O99412 Diseases of the circulatory system complicating pregnancy, second trimester: Secondary | ICD-10-CM

## 2016-02-22 ENCOUNTER — Emergency Department
Admission: EM | Admit: 2016-02-22 | Discharge: 2016-02-22 | Disposition: A | Discharge status: Home / Self Care | Payer: Medicaid Other | Attending: Emergency Medicine | Admitting: Emergency Medicine

## 2016-02-22 ENCOUNTER — Encounter: Payer: Self-pay | Admitting: Emergency Medicine

## 2016-02-22 DIAGNOSIS — Z3A27 27 weeks gestation of pregnancy: Secondary | ICD-10-CM | POA: Diagnosis not present

## 2016-02-22 DIAGNOSIS — Z3490 Encounter for supervision of normal pregnancy, unspecified, unspecified trimester: Secondary | ICD-10-CM

## 2016-02-22 DIAGNOSIS — R55 Syncope and collapse: Secondary | ICD-10-CM | POA: Diagnosis not present

## 2016-02-22 DIAGNOSIS — O26892 Other specified pregnancy related conditions, second trimester: Secondary | ICD-10-CM | POA: Insufficient documentation

## 2016-02-22 LAB — CBC
HEMATOCRIT: 31.4 % — AB (ref 35.0–47.0)
HEMOGLOBIN: 11 g/dL — AB (ref 12.0–16.0)
MCH: 28.7 pg (ref 26.0–34.0)
MCHC: 35 g/dL (ref 32.0–36.0)
MCV: 82 fL (ref 80.0–100.0)
Platelets: 160 10*3/uL (ref 150–440)
RBC: 3.83 MIL/uL (ref 3.80–5.20)
RDW: 14.7 % — AB (ref 11.5–14.5)
WBC: 9.9 10*3/uL (ref 3.6–11.0)

## 2016-02-22 LAB — COMPREHENSIVE METABOLIC PANEL
ALBUMIN: 2.9 g/dL — AB (ref 3.5–5.0)
ALT: 8 U/L — ABNORMAL LOW (ref 14–54)
ANION GAP: 6 (ref 5–15)
AST: 15 U/L (ref 15–41)
Alkaline Phosphatase: 56 U/L (ref 38–126)
BUN: <5 mg/dL — ABNORMAL LOW (ref 6–20)
CO2: 22 mmol/L (ref 22–32)
Calcium: 8.6 mg/dL — ABNORMAL LOW (ref 8.9–10.3)
Chloride: 107 mmol/L (ref 101–111)
Creatinine, Ser: 0.49 mg/dL (ref 0.44–1.00)
GFR calc non Af Amer: >60 mL/min (ref >60)
GLUCOSE: 99 mg/dL (ref 65–99)
POTASSIUM: 3.6 mmol/L (ref 3.5–5.1)
SODIUM: 135 mmol/L (ref 135–145)
Total Bilirubin: <0.1 mg/dL — ABNORMAL LOW (ref 0.3–1.2)
Total Protein: 6.5 g/dL (ref 6.5–8.1)

## 2016-02-22 LAB — URINALYSIS COMPLETE WITH MICROSCOPIC (ARMC ONLY)
Bilirubin Urine: NEGATIVE
Glucose, UA: NEGATIVE mg/dL
HGB URINE DIPSTICK: NEGATIVE
Ketones, ur: NEGATIVE mg/dL
NITRITE: NEGATIVE
PH: 6 (ref 5.0–8.0)
PROTEIN: NEGATIVE mg/dL
SPECIFIC GRAVITY, URINE: 1.009 (ref 1.005–1.030)

## 2016-02-22 LAB — TROPONIN I: Troponin I: <0.03 ng/mL (ref <0.03)

## 2016-02-22 LAB — HCG, QUANTITATIVE, PREGNANCY: HCG, BETA CHAIN, QUANT, S: 13683 m[IU]/mL — AB (ref <5)

## 2016-02-22 NOTE — ED Notes (Signed)
Patient given ED sandwich tray and drink per Dr. Merlene PullingLord's approval.

## 2016-02-22 NOTE — ED Provider Notes (Signed)
[facility] Emergency Department Provider Note  ____________________________________________   First MD Initiated Contact with Patient 02/22/16 1456     (approximate)  I have reviewed the triage vital signs and the nursing notes.   HISTORY  Chief Complaint Loss of Consciousness    HPI Vicki Adams is a 20 y.o. female G2 P1 at [redacted] weeks gestation who presents for a near syncopal episode.  She has had 2 episodes previously over the course of the last month and a half.  She went to Dr. Alvino Chapel with [facility] within the last couple of days who evaluated her and mentioned to her (verified in the electronic medical record) that if she has an additional event she may benefit from a Holter monitor.   There is no indication of WPW, Brugada, or other imminently dangerous arrhythmia on her EKG.  She was told to avoid caffeine and stay hydrated to try to avoid additional episodes.  Was noted that her heart rate increased 20 or more beats per minute and upon standing so there seems to be an element of orthostatic hypotension.  The patient reports thatshe has been trying to drink during the day especially while she was at work on her feet but that she typically drinks soda.  Today she had a near syncopal episode where she got "tunnel vision", became flushed and felt hot all over, and felt lightheaded.  The onset of the symptoms was acute and the intensity was severe. She knew what was happening so she states that she ran to the bathroom and sat down and the sensation passed.  A little while later she got dizzy but without the severe symptoms but she felt like she should be evaluated as a result.  She denies fever/chills, chest pain, shortness of breath, cough, nausea, vomiting, diarrhea.  She says that every time she has one of these episodes she feels a brief abdominal cramping but that it has subsided completely.  She can feel her baby moving during our conversation  and she has had no vaginal bleeding and no gush of vaginal fluid.  Past Medical History:  Diagnosis Date  . Anemia   . 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 Syncope and collapse     Patient Active Problem List   Diagnosis Date Noted  . Congenital heart disease, maternal, antepartum 01/23/2016  . Postpartum care following vaginal delivery 02/18/2015  . History of percutaneous transcatheter closure of congenital ASD 01/24/2015  . History of esophageal hernia repair 01/24/2015    Past Surgical History:  Procedure Laterality Date  . CARDIAC SURGERY  11/2000  . HERNIA REPAIR    . HERNIA REPAIR  01/10/1996   esophageal hernia repair done at 81 weeks of age at [facility]    Prior to Admission medications   Medication Sig Start Date End Date Taking? Authorizing Provider  acetaminophen (TYLENOL) 325 MG tablet Take 650 mg by mouth every 6 (six) hours as needed for mild pain, moderate pain, fever or headache.    Historical Provider, MD  Prenatal Vit-Fe Fumarate-FA (PRENATAL MULTIVITAMIN) TABS tablet Take 1 tablet by mouth daily.     Historical Provider, MD    Allergies Erythromycin  Family History  Problem Relation Age of Onset  . Migraines Mother   . Kidney disease Father   . Obesity Father   . Heart failure Father   . Scoliosis Sister   . Emphysema Maternal Grandmother   . Heart failure Maternal Grandmother  Social History Social History  Substance Use Topics  . Smoking status: Never Smoker  . Smokeless tobacco: Never Used  . Alcohol use No     Comment: occ    Review of Systems Constitutional: No fever/chills Eyes: No visual changes. ENT: No sore throat. Cardiovascular: Denies chest pain. Near syncope earlier today Respiratory: Denies shortness of breath. Gastrointestinal: brief abdominal cramping after near syncopal episode, no other abdominal pain.  No nausea, no vomiting.  No diarrhea.  No constipation. Genitourinary: Negative for dysuria. Musculoskeletal: Negative  for back pain. Skin: Negative for rash. Neurological: Negative for headaches, focal weakness or numbness.   10-point ROS otherwise negative.  ____________________________________________   PHYSICAL EXAM:  VITAL SIGNS: ED Triage Vitals  Enc Vitals Group     BP 02/22/16 1329 (!) 126/58     Pulse Rate 02/22/16 1329 90     Resp 02/22/16 1329 18     Temp 02/22/16 1329 98.3 F (36.8 C)     Temp Source 02/22/16 1329 Oral     SpO2 02/22/16 1329 99 %     Weight 02/22/16 1330 185 lb (83.9 kg)     Height 02/22/16 1330 5\' 5"  (1.651 m)     Head Circumference --      Peak Flow --      Pain Score --      Pain Loc --      Pain Edu? --      Excl. in GC? --     Constitutional: Alert and oriented. Well appearing and in no acute distress. Eyes: Conjunctivae are normal. PERRL. EOMI. Head: Atraumatic. Nose: No congestion/rhinnorhea. Mouth/Throat: Mucous membranes are moist.  Oropharynx non-erythematous. Neck: No stridor.  No meningeal signs.   Cardiovascular: Normal rate, regular rhythm. Good peripheral circulation. Grossly normal heart sounds. Respiratory: Normal respiratory effort.  No retractions. Lungs CTAB. Gastrointestinal: Soft and nontender. Gravid uterus.  Baby is kicking.No distention.  Musculoskeletal: No lower extremity tenderness nor edema. No gross deformities of extremities. Neurologic:  Normal speech and language. No gross focal neurologic deficits are appreciated.  Skin:  Skin is warm, dry and intact. No rash noted. Psychiatric: Mood and affect are normal. Speech and behavior are normal.  ____________________________________________   LABS (all labs ordered are listed, but only abnormal results are displayed)  Labs Reviewed  CBC - Abnormal; Notable for the following:       Result Value   Hemoglobin 11.0 (*)    HCT 31.4 (*)    RDW 14.7 (*)    All other components within normal limits  URINALYSIS COMPLETEWITH MICROSCOPIC ([facility] ONLY) - Abnormal; Notable for the  following:    Color, Urine YELLOW (*)    APPearance HAZY (*)    Leukocytes, UA 2+ (*)    Bacteria, UA RARE (*)    Squamous Epithelial / LPF 6-30 (*)    All other components within normal limits  COMPREHENSIVE METABOLIC PANEL - Abnormal; Notable for the following:    BUN <5 (*)    Calcium 8.6 (*)    Albumin 2.9 (*)    ALT 8 (*)    Total Bilirubin <0.1 (*)    All other components within normal limits  HCG, QUANTITATIVE, PREGNANCY - Abnormal; Notable for the following:    hCG, Beta Chain, Quant, S 13,683 (*)    All other components within normal limits  TROPONIN I   ____________________________________________  EKG  ED ECG REPORT I, Ramzy Cappelletti, the attending physician, personally viewed and interpreted this ECG.  Date: 02/22/2016 EKG Time: 13:38 Rate: 88 Rhythm: normal sinus rhythm with mild sinus arrhythmia QRS Axis: normal Intervals: normal ST/T Wave abnormalities: normal Conduction Disturbances: none Narrative Interpretation: unremarkable and not significantly changed from prior several days ago at cardiology appointment  ____________________________________________  RADIOLOGY   No results found.  ____________________________________________   PROCEDURES  Procedure(s) performed:   Procedures   Critical Care performed: No ____________________________________________   INITIAL IMPRESSION / ASSESSMENT AND PLAN / ED COURSE  Pertinent labs & imaging results that were available during my care of the patient were reviewed by me and considered in my medical decision making (see chart for details).  Patient's exam and vital signs and lab workup including EKG are all reassuring.  Today she did not have a full syncopal episode and her symptoms are most consistent with either a vasovagal episode or orthostatic hypertension.  I will give her a note to excuse her from work this weekend and I will send a note through [facility] to Dr. Alvino Chapel to try to facilitate a follow-up  appointment.  I had an extended conversation with the patient and her husband about how I would like to provide a liter of IV fluids to help "fill up the tank", but the patient is very resistant and does not "want a needle".  I explained her the very minimal risk and the significant potential benefits to getting IV fluids but she has refused 3 times and I will not continue to press her about this.  She is able to tolerate good by mouth intake and is eating a meal and drinking fluids in the ED.  I encouraged her again to avoid caffeinated and sugary drinks and to stick with water, Gatorade, etc.  I gave her my usual and customary return precautions.  Of note, initially in the triage assessment there is some mention of her going to L&D for evaluation but she is having no abdominal discomfort and can feel her fetus kicking at this time.  She does not want to go to L&D and I do not think it is necessary as she has had no other symptoms.  We will discharge her home at this time.    ____________________________________________  FINAL CLINICAL IMPRESSION(S) / ED DIAGNOSES  Final diagnoses:  Near syncope  Pregnancy     MEDICATIONS GIVEN DURING THIS VISIT:  Medications - No data to display   NEW OUTPATIENT MEDICATIONS STARTED DURING THIS VISIT:  New Prescriptions   No medications on file      Note:  This document was prepared using Dragon voice recognition software and may include unintentional dictation errors.    Loleta Rose, MD 02/22/16 [phone]

## 2016-02-22 NOTE — ED Triage Notes (Signed)
LDR advises patient to be cleared through ED prior to monitoring by them.

## 2016-02-22 NOTE — ED Triage Notes (Signed)
States had syncopal episode on July 29th and August 5th. States saw MD at that time and followed up with cardiologist and was told if it continued that they would put her on a heart monitor. Arrives because had weak episode with dizziness today. Started having abdominal cramping after but cramping has stopped now. No vag bleed or fluid. Denies abdominal pain.

## 2016-02-22 NOTE — Discharge Instructions (Signed)
You have been seen today in the Emergency Department (ED)  for near syncope (almost passing out).  Your workup including labs and EKG show reassuring results.  Your symptoms may be due to dehydration, so it is important that you drink plenty of non-alcoholic fluids.  Try to avoid soda and caffeine in general.  Please call your cardiologist as soon as possible to schedule the next available clinic appointment to follow up with her regarding your visit to the ED and your symptoms.  Return to the Emergency Department (ED)  if you have any further syncopal episodes (pass out again) or develop ANY chest pain, pressure, tightness, trouble breathing, sudden sweating, or other symptoms that concern you.

## 2016-02-25 ENCOUNTER — Telehealth: Payer: Self-pay | Admitting: *Deleted

## 2016-02-25 DIAGNOSIS — R002 Palpitations: Secondary | ICD-10-CM

## 2016-02-25 NOTE — Telephone Encounter (Signed)
Patient reports that she had another episode since her visit with us and that Dr. Alvino ChapelIngal mentioned doing a monitor if this occurred again. Reviewed echo results with patient and she verbalized understanding with no further questions. Let her know that I would be in touch.

## 2016-02-25 NOTE — Telephone Encounter (Signed)
-----   Message from Almond LintAileen Ingal, MD sent at 02/25/2016 10:07 AM EDT ----- Heart function okay, echo findings unremarkable

## 2016-02-25 NOTE — Telephone Encounter (Signed)
I saw the note from the ED physician. The Holter monitor was recommended in the event that her symptoms persisted despite her taking excessive caffeine, addressing hydration status. If she would like to proceed with Holter, we can order it. Diagnosis is palpitations.

## 2016-02-25 NOTE — Telephone Encounter (Signed)
Spoke with patient and let her know that order was placed for holter monitor and that I would call her with results once they are available. Let her know that someone would call to schedule placement of monitor. She verbalized understanding of our conversation, agreement with plan, and no further questions at this time.

## 2016-02-26 NOTE — Telephone Encounter (Signed)
Pt is coming this Friday at 830

## 2016-02-27 LAB — OB RESULTS CONSOLE RPR: RPR: NONREACTIVE

## 2016-02-27 LAB — OB RESULTS CONSOLE HIV ANTIBODY (ROUTINE TESTING): HIV: NONREACTIVE

## 2016-02-28 ENCOUNTER — Ambulatory Visit (INDEPENDENT_AMBULATORY_CARE_PROVIDER_SITE_OTHER): Payer: Medicaid Other

## 2016-02-28 DIAGNOSIS — R002 Palpitations: Secondary | ICD-10-CM

## 2016-03-04 ENCOUNTER — Ambulatory Visit: Payer: Medicaid Other | Admitting: Cardiology

## 2016-03-11 ENCOUNTER — Telehealth: Payer: Self-pay

## 2016-03-11 NOTE — Telephone Encounter (Signed)
Patient notified that we need her to bring the holter monitor back to our office today; the patient is aware and will bring the monitor today.

## 2016-03-25 ENCOUNTER — Observation Stay
Admission: EM | Admit: 2016-03-25 | Discharge: 2016-03-25 | Disposition: A | Discharge status: Home / Self Care | Payer: Medicaid Other | Attending: Obstetrics and Gynecology | Admitting: Obstetrics and Gynecology

## 2016-03-25 ENCOUNTER — Encounter: Payer: Self-pay | Admitting: *Deleted

## 2016-03-25 DIAGNOSIS — O368130 Decreased fetal movements, third trimester, not applicable or unspecified: Principal | ICD-10-CM | POA: Insufficient documentation

## 2016-03-25 DIAGNOSIS — Z3A31 31 weeks gestation of pregnancy: Secondary | ICD-10-CM | POA: Diagnosis not present

## 2016-03-25 NOTE — Discharge Instructions (Signed)
Come back if: ° °Big gush of fluid °Temp over 100.4 °Heavy vaginal bleeding °Decreased fetal movement °Contractions every 3-5 min lasting at least one hour ° °Get plenty of rest and stay well hydrated! °

## 2016-03-25 NOTE — OB Triage Note (Signed)
Recvd pt from ED. C/O not feeling baby since 0200 this morning. No vaginal bleeding. No intercourse in the past 24 hours. Staying well hydrated. Pt has no other complaints and is feeling no contractions.

## 2016-03-25 NOTE — Final Progress Note (Signed)
Physician Final Progress Note  Patient ID: Molli PoseyCheyenne Nicole Porter MRN: [id number] DOB/AGE: 20/12/1995 20 y.o.  Admit date: 03/25/2016 Admitting provider: Conard NovakStephen D Avery Klingbeil, MD Discharge date: 03/25/2016   Admission Diagnoses: decreased fetal movement  Discharge Diagnoses:  Reassuring antenatal testing  History of Present Illness: The patient is a 20 y.o. female G2P1001 at 8348w5d who presents for decreased fetal movement for the past day.  She notes no ctx, no LOF and no vaginal bleeding.   Past Medical History:  Diagnosis Date  . Anemia   . Secundum ASD    [facility]  . Syncope and collapse     Past Surgical History:  Procedure Laterality Date  . CARDIAC SURGERY  11/2000  . HERNIA REPAIR    . HERNIA REPAIR  06/1995   esophageal hernia repair done at 772 weeks of age [facility]    No current facility-administered medications on file prior to encounter.    Current Outpatient Prescriptions on File Prior to Encounter  Medication Sig Dispense Refill  . Prenatal Vit-Fe Fumarate-FA (PRENATAL MULTIVITAMIN) TABS tablet Take 1 tablet by mouth daily.     Marland Kitchen. acetaminophen (TYLENOL) 325 MG tablet Take 650 mg by mouth every 6 (six) hours as needed for mild pain, moderate pain, fever or headache.      Allergies  Allergen Reactions  . Erythromycin Anaphylaxis    Social History   Social History  . Marital status: Single    Spouse name: N/A  . Number of children: N/A  . Years of education: N/A   Occupational History  . Not on file.   Social History Main Topics  . Smoking status: Never Smoker  . Smokeless tobacco: Never Used  . Alcohol use No     Comment: occ  . Drug use: No  . Sexual activity: Yes   Other Topics Concern  . Not on file   Social History Narrative  . No narrative on file    Physical Exam: LMP 08/23/2015   Gen: NAD CV: RRR Pulm: CTAB Ext: no e/c/t  Consults: None  Significant Findings/ Diagnostic Studies: None  Procedures: NST Baseline:  145 Variability: moderate Accelerations: present Decelerations: absent Tocometry: no ctx Category 1 overall and reactive   Discharge Condition: stable  Disposition: 01-Home or Self Care  Diet: Regular diet  Discharge Activity: Activity as tolerated     Medication List    TAKE these medications   acetaminophen 325 MG tablet Commonly known as:  TYLENOL Take 650 mg by mouth every 6 (six) hours as needed for mild pain, moderate pain, fever or headache.   aspirin 81 MG chewable tablet Chew by mouth daily.   prenatal multivitamin Tabs tablet Take 1 tablet by mouth daily.        Total time spent taking care of this patient: 25 minutes  Signed: Conard NovakJackson, Nataley Bahri D, MD  03/25/2016, 9:31 PM

## 2016-04-30 LAB — OB RESULTS CONSOLE GBS: GBS: POSITIVE

## 2016-05-09 ENCOUNTER — Observation Stay
Admission: EM | Admit: 2016-05-09 | Discharge: 2016-05-09 | Disposition: A | Discharge status: Home / Self Care | Payer: Medicaid Other | Attending: Obstetrics and Gynecology | Admitting: Obstetrics and Gynecology

## 2016-05-09 DIAGNOSIS — O368130 Decreased fetal movements, third trimester, not applicable or unspecified: Secondary | ICD-10-CM | POA: Diagnosis present

## 2016-05-09 DIAGNOSIS — Z3A38 38 weeks gestation of pregnancy: Secondary | ICD-10-CM | POA: Insufficient documentation

## 2016-05-09 NOTE — Discharge Summary (Signed)
Patient discharged with instructions on labor precautions, fetal kick count, follow up appointment, and when to seek medical attention. Patient ambulatory at discharge with steady and no complaints.

## 2016-05-09 NOTE — ED Triage Notes (Signed)
OBS 4, JACKSON MD

## 2016-05-09 NOTE — Discharge Summary (Signed)
  Physician Discharge Summary  Patient ID: Vicki Adams MRN: [id number] DOB/AGE: 20/12/1995 20 y.o.  Admit date: 05/09/2016  Admitting provider: Conard NovakStephen D Jevonte Clanton, MD Discharge date: 05/09/2016    Admission Diagnoses: decreased fetal movement  Discharge Diagnoses:  Reassuring antenatal testing  History of Present Illness: The patient is a 20 y.o. female G2P1001 at 3162w1d who presents for decreased fetal movement for the past day.  She notes no ctx, no LOF and no vaginal bleeding.       Past Medical History:  Diagnosis Date  . Anemia   . Secundum ASD    [facility]  . Syncope and collapse          Past Surgical History:  Procedure Laterality Date  . CARDIAC SURGERY  11/2000  . HERNIA REPAIR    . HERNIA REPAIR  06/1995   [facility] 412 [facility]    No current facility-administered medications on file prior to encounter.          Current Outpatient Prescriptions on File Prior to Encounter  Medication Sig Dispense Refill  . Prenatal Vit-Fe Fumarate-FA (PRENATAL MULTIVITAMIN) TABS tablet Take 1 tablet by mouth daily.     Marland Kitchen. acetaminophen (TYLENOL) 325 MG tablet Take 650 mg by mouth every 6 (six) hours as needed for mild pain, moderate pain, fever or headache.          Allergies  Allergen Reactions  . Erythromycin Anaphylaxis    Social History        Social History  . Marital status: Single    Spouse name: N/A  . Number of children: N/A  . Years of education: N/A      Occupational History  . Not on file.         Social History Main Topics  . Smoking status: Never Smoker  . Smokeless tobacco: Never Used  . Alcohol use No     Comment: occ  . Drug use: No  . Sexual activity: Yes       Other Topics Concern  . Not on file      Social History Narrative  . No narrative on file    Physical Exam: BP 113/64   Pulse 73   Temp 98.5 F (36.9 C) (Oral)   LMP  08/23/2015   Gen: NAD CV: RRR Pulm: CTAB Ext: no e/c/t  Consults: None  Significant Findings/ Diagnostic Studies: None  Procedures: NST Baseline:  normal Variability: moderate Accelerations: present Decelerations: absent Tocometry: no ctx Category 1 overall and reactive   Discharge Condition: stable  Disposition: 01-Home or Self Care  Diet: Regular diet  Discharge Activity: Activity as tolerated  Thomasene MohairStephen Yann Biehn, MD 05/09/2016 10:30 PM

## 2016-05-09 NOTE — OB Triage Note (Signed)
Patient came in for observation for decreased fetal movement. Patient reports irregular uterine contractions that are not really painful. Patient denies leaking of fluid, denies vaginal bleeding and spotting. Vital signs stable and patient afebrile. FHR baseline 135 with moderate variability with accelerations 15 x 15 and no decelerations. Will continue to monitor.

## 2016-05-10 ENCOUNTER — Encounter: Payer: Self-pay | Admitting: *Deleted

## 2016-05-10 ENCOUNTER — Inpatient Hospital Stay
Admission: EM | Admit: 2016-05-10 | Discharge: 2016-05-13 | DRG: 775 | Disposition: A | Discharge status: Home / Self Care | Payer: Medicaid Other | Attending: Certified Nurse Midwife | Admitting: Certified Nurse Midwife

## 2016-05-10 DIAGNOSIS — O4292 Full-term premature rupture of membranes, unspecified as to length of time between rupture and onset of labor: Secondary | ICD-10-CM | POA: Diagnosis present

## 2016-05-10 DIAGNOSIS — Z8249 Family history of ischemic heart disease and other diseases of the circulatory system: Secondary | ICD-10-CM

## 2016-05-10 DIAGNOSIS — Z6791 Unspecified blood type, Rh negative: Secondary | ICD-10-CM

## 2016-05-10 DIAGNOSIS — O26893 Other specified pregnancy related conditions, third trimester: Secondary | ICD-10-CM | POA: Diagnosis present

## 2016-05-10 DIAGNOSIS — Z3A38 38 weeks gestation of pregnancy: Secondary | ICD-10-CM

## 2016-05-10 DIAGNOSIS — O99824 Streptococcus B carrier state complicating childbirth: Secondary | ICD-10-CM | POA: Diagnosis present

## 2016-05-10 NOTE — OB Triage Note (Signed)
Pt recvd from ED. C/o contractions all day but unable to tell me how far apart. No intercourse in the past 24 hours. No vaginal bleeding and feeling baby move ok.

## 2016-05-11 ENCOUNTER — Encounter: Payer: Self-pay | Admitting: Obstetrics and Gynecology

## 2016-05-11 DIAGNOSIS — O99824 Streptococcus B carrier state complicating childbirth: Secondary | ICD-10-CM | POA: Diagnosis present

## 2016-05-11 DIAGNOSIS — Z6791 Unspecified blood type, Rh negative: Secondary | ICD-10-CM | POA: Diagnosis not present

## 2016-05-11 DIAGNOSIS — Z8249 Family history of ischemic heart disease and other diseases of the circulatory system: Secondary | ICD-10-CM | POA: Diagnosis not present

## 2016-05-11 DIAGNOSIS — Z3A38 38 weeks gestation of pregnancy: Secondary | ICD-10-CM | POA: Diagnosis not present

## 2016-05-11 DIAGNOSIS — O9089 Other complications of the puerperium, not elsewhere classified: Secondary | ICD-10-CM | POA: Diagnosis present

## 2016-05-11 DIAGNOSIS — O4292 Full-term premature rupture of membranes, unspecified as to length of time between rupture and onset of labor: Secondary | ICD-10-CM | POA: Diagnosis present

## 2016-05-11 DIAGNOSIS — O26893 Other specified pregnancy related conditions, third trimester: Secondary | ICD-10-CM | POA: Diagnosis present

## 2016-05-11 LAB — CBC
HCT: 35.2 % (ref 35.0–47.0)
HEMOGLOBIN: 11.5 g/dL — AB (ref 12.0–16.0)
MCH: 24.3 pg — AB (ref 26.0–34.0)
MCHC: 32.6 g/dL (ref 32.0–36.0)
MCV: 74.4 fL — ABNORMAL LOW (ref 80.0–100.0)
Platelets: 160 10*3/uL (ref 150–440)
RBC: 4.74 MIL/uL (ref 3.80–5.20)
RDW: 18.3 % — AB (ref 11.5–14.5)
WBC: 10.4 10*3/uL (ref 3.6–11.0)

## 2016-05-11 LAB — URINE DRUG SCREEN, QUALITATIVE (ARMC ONLY)
AMPHETAMINES, UR SCREEN: NOT DETECTED
Barbiturates, Ur Screen: NOT DETECTED
Benzodiazepine, Ur Scrn: NOT DETECTED
COCAINE METABOLITE, UR ~~LOC~~: NOT DETECTED
Cannabinoid 50 Ng, Ur ~~LOC~~: NOT DETECTED
MDMA (ECSTASY) UR SCREEN: NOT DETECTED
Methadone Scn, Ur: NOT DETECTED
Opiate, Ur Screen: NOT DETECTED
Phencyclidine (PCP) Ur S: NOT DETECTED
TRICYCLIC, UR SCREEN: NOT DETECTED

## 2016-05-11 LAB — CHLAMYDIA/NGC RT PCR (ARMC ONLY)
CHLAMYDIA TR: NOT DETECTED
N GONORRHOEAE: NOT DETECTED

## 2016-05-11 LAB — RAPID HIV SCREEN (HIV 1/2 AB+AG)
HIV 1/2 ANTIBODIES: NONREACTIVE
HIV-1 P24 ANTIGEN - HIV24: NONREACTIVE

## 2016-05-11 MED ORDER — OXYTOCIN 10 UNIT/ML IJ SOLN
INTRAMUSCULAR | Status: AC
  Filled 2016-05-11: qty 2

## 2016-05-11 MED ORDER — DINOPROSTONE 10 MG VA INST
10.0000 mg | VAGINAL_INSERT | Freq: Once | VAGINAL | Status: AC
  Administered 2016-05-11: 10 mg via VAGINAL
  Filled 2016-05-11: qty 1

## 2016-05-11 MED ORDER — PENICILLIN G POT IN DEXTROSE 60000 UNIT/ML IV SOLN
3.0000 10*6.[IU] | INTRAVENOUS | Status: DC
  Administered 2016-05-11 – 2016-05-12 (×6): 3 10*6.[IU] via INTRAVENOUS
  Filled 2016-05-11 (×12): qty 50

## 2016-05-11 MED ORDER — BUTORPHANOL TARTRATE 1 MG/ML IJ SOLN
INTRAMUSCULAR | Status: AC
  Administered 2016-05-11: 1 mg via INTRAVENOUS
  Filled 2016-05-11: qty 1

## 2016-05-11 MED ORDER — TERBUTALINE SULFATE 1 MG/ML IJ SOLN
0.2500 mg | Freq: Once | INTRAMUSCULAR | Status: DC | PRN
  Filled 2016-05-11: qty 1

## 2016-05-11 MED ORDER — LACTATED RINGERS IV SOLN
INTRAVENOUS | Status: DC
  Administered 2016-05-11 (×3): via INTRAVENOUS

## 2016-05-11 MED ORDER — MISOPROSTOL 200 MCG PO TABS
ORAL_TABLET | ORAL | Status: AC
  Filled 2016-05-11: qty 4

## 2016-05-11 MED ORDER — BUTORPHANOL TARTRATE 1 MG/ML IJ SOLN
1.0000 mg | INTRAMUSCULAR | Status: DC | PRN
  Administered 2016-05-11 – 2016-05-12 (×5): 1 mg via INTRAVENOUS
  Filled 2016-05-11 (×4): qty 1

## 2016-05-11 MED ORDER — PENICILLIN G POTASSIUM 5000000 UNITS IJ SOLR
5.0000 10*6.[IU] | Freq: Once | INTRAMUSCULAR | Status: AC
Start: 2016-05-11 — End: 2016-05-11
  Administered 2016-05-11: 5 10*6.[IU] via INTRAVENOUS
  Filled 2016-05-11: qty 5

## 2016-05-11 MED ORDER — OXYTOCIN BOLUS FROM INFUSION: 500.0000 mL | Freq: Once | INTRAVENOUS | Status: DC

## 2016-05-11 MED ORDER — ONDANSETRON HCL 4 MG/2ML IJ SOLN: 4.0000 mg | Freq: Four times a day (QID) | INTRAMUSCULAR | Status: DC | PRN

## 2016-05-11 MED ORDER — OXYTOCIN 40 UNITS IN LACTATED RINGERS INFUSION - SIMPLE MED
2.5000 [IU]/h | INTRAVENOUS | Status: DC
  Filled 2016-05-11: qty 1000

## 2016-05-11 MED ORDER — LIDOCAINE HCL (PF) 1 % IJ SOLN
INTRAMUSCULAR | Status: AC
  Filled 2016-05-11: qty 30

## 2016-05-11 MED ORDER — SOD CITRATE-CITRIC ACID 500-334 MG/5ML PO SOLN: 30.0000 mL | ORAL | Status: DC | PRN

## 2016-05-11 MED ORDER — OXYTOCIN 40 UNITS IN LACTATED RINGERS INFUSION - SIMPLE MED
1.0000 m[IU]/min | INTRAVENOUS | Status: DC
  Administered 2016-05-11: 1 m[IU]/min via INTRAVENOUS

## 2016-05-11 MED ORDER — LACTATED RINGERS IV SOLN: 500.0000 mL | INTRAVENOUS | Status: DC | PRN

## 2016-05-11 MED ORDER — OXYTOCIN 10 UNIT/ML IJ SOLN: 10.0000 [IU] | Freq: Once | INTRAMUSCULAR | Status: DC

## 2016-05-11 MED ORDER — OXYTOCIN 40 UNITS IN LACTATED RINGERS INFUSION - SIMPLE MED
1.0000 m[IU]/min | INTRAVENOUS | Status: DC
Start: 2016-05-11 — End: 2016-05-12

## 2016-05-11 MED ORDER — LIDOCAINE HCL (PF) 1 % IJ SOLN: 30.0000 mL | INTRAMUSCULAR | Status: DC | PRN

## 2016-05-11 MED ORDER — BUTORPHANOL TARTRATE 1 MG/ML IJ SOLN: 1.0000 mg | INTRAMUSCULAR | Status: DC

## 2016-05-11 MED ORDER — AMMONIA AROMATIC IN INHA
RESPIRATORY_TRACT | Status: AC
  Filled 2016-05-11: qty 10

## 2016-05-11 NOTE — Progress Notes (Signed)
L&D Progress Note  PAD #1 Induction of labor for non reassuring fetal heart tracing at term  S: Feeling some contractions, was sleeping interrmittently  O:BP 127/66 (BP Location: Left Arm)   Pulse 85   Temp 97.8 F (36.6 C) (Oral)   Resp 18   Ht 5\' 5"  (1.651 m)   Wt 88.9 kg (196 lb)   LMP 08/23/2015   BMI 32.62 kg/m   General: in NAD, appears comfortable FHR: 135 to 140 baseline with accelerations to 160s to 170, moderate variability, no decelerations Contractions: difficulty picking up some contractions depending on maternal position-appear irregular with uterine irritability Cervidil removed Cervix: 1.5-2 cm/ 30-40%/ -2  A: IOL in progress for FHR prolonged fetal heart rate deceleration FWB: CAT 1  P: Will allow to shower and eat lightly Will then start Pitocin and consider foley bulb.  Farrel ConnersGUTIERREZ, Ausar Georgiou, CNM

## 2016-05-11 NOTE — H&P (Signed)
OB History & Physical   History of Present Illness:  Chief Complaint: contractions  HPI:  Vicki Adams is a 20 y.o. 762P1001 female at 788w3d dated by LMP consistent with 8 week ultrasound3.  Her pregnancy has been complicated by close pregnancy spacing (last delivery 05/2015), rh negative, anti-D antibody (negative screen late in pregnancy), history of maternal ASD repair as child, .   With her history of ASD and repair, she underwent an ECHO this pregnancy, which was essentially normal. Her fetus also underwent ECHO which was normal.  She reports contractions.   She denies leakage of fluid.   She denies vaginal bleeding.   She reports fetal movement.    Maternal Medical History:   Past Medical History:  Diagnosis Date  . Anemia   . Secundum ASD    [facility]  . Syncope and collapse     Past Surgical History:  Procedure Laterality Date  . CARDIAC SURGERY  11/2000  . HERNIA REPAIR    . HERNIA REPAIR  06/1995   [facility] 372 [facility]    Allergies  Allergen Reactions  . Erythromycin Anaphylaxis    Prior to Admission medications   Not on File    OB History  Gravida Para Term Preterm AB Living  2 1 1  0 0 1  SAB TAB Ectopic Multiple Live Births  0 0 0 0 1    # Outcome Date GA Lbr Len/2nd Weight Sex Delivery Anes PTL Lv  2 Current           1 Term 06/22/15 7863w2d  7 lb 0.2 oz (3.18 kg) F Vag-Spont None  LIV      Prenatal care site: [facility]  Social History: She  reports that she has never smoked. She has never used smokeless tobacco. She reports that she does not drink alcohol or use drugs.  Family History: family history includes Emphysema in her maternal grandmother; Heart failure in her father and maternal grandmother; Kidney disease in her father; Migraines in her mother; Obesity in her father; Scoliosis in her sister.   Review of Systems: Negative x 10 systems reviewed except as noted in the HPI.     Physical Exam:  Vital Signs: BP 111/70 (BP Location: Left Arm)   Pulse 91   Temp 98.4 F (36.9 C)   Resp 16   LMP 08/23/2015  General: no acute distress.  HEENT: normocephalic, atraumatic Heart: regular rate & rhythm.  No murmurs/rubs/gallops Lungs: clear to auscultation bilaterally Abdomen: soft, gravid, non-tender;  EFW: 7 pounds Pelvic: (female chaperone present during pelvic exam)  External: Normal external female genitalia  Cervix: FT/long/high Extremities: non-tender, symmetric, no edema bilaterally.  DTRs: 2+  Neurologic: Alert & oriented x 3.    Pertinent Results:  Prenatal Labs: Blood type/Rh O negative  Antibody screen Initially positive in pregnancy, however it was too weak to titer. Later pregnancy antibody screens were negative. She did not receive a rhogam injection at 28 weeks, though she did receive one on 11/9.   Rubella Immune  Varicella Not immune    RPR NR  HBsAg negative  HIV negative  GC negative  Chlamydia negative  Genetic screening NIPT, diploid XY, msAFP neg  1 hour GTT 100  3 hour GTT n/a  GBS positive on 04/30/16   Baseline FHR: 150 beats/min   Variability: moderate   Accelerations: present   Decelerations: present Contractions: present frequency: irregular Overall assessment: category 2  The patient had a deceleration that lasted about 2 minutes down to the 60s with return to baseline. It was category 1 for a while after that. However, she began to have a peiord where the baseline was indeterminate and the variability seemed marked.    Bedside Ultrasound:  Number of Fetus: 1  Presentation: cephalic  Fluid: subjectively normal  Placental Location: posterior  Assessment:  Vicki Adams is a 20 y.o. 562P1001 female at 2859w3d with non-reassuring fetal heart rate tracing.  She has had an overall reassuring fetal heart rate tracing. However, with her one large deceleration and long periods of marked variability and uncertain baseline,  these together make me hesitant to discharge her.  Given her gestational age, plan to keep and induce her labor.  I discussed this in great detail with her. She agrees to the plan.   Plan:  1. Admit to Labor & Delivery  2. CBC, T&S, Clrs, IVF 3. GBS positive - PCN 4. Fetwal well-being: reassuring overall.  Though category 2 at times. 5. IOL with cervidil, initially.   Conard NovakJackson, Magalene Mclear D, MD 05/11/2016 1:11 AM

## 2016-05-12 ENCOUNTER — Encounter: Payer: Self-pay | Admitting: *Deleted

## 2016-05-12 LAB — RPR: RPR Ser Ql: NONREACTIVE

## 2016-05-12 LAB — PLATELET COUNT: Platelets: 149 10*3/uL — ABNORMAL LOW (ref 150–440)

## 2016-05-12 MED ORDER — BENZOCAINE-MENTHOL 20-0.5 % EX AERO: 1.0000 "application " | INHALATION_SPRAY | CUTANEOUS | Status: DC | PRN

## 2016-05-12 MED ORDER — OXYCODONE-ACETAMINOPHEN 5-325 MG PO TABS: 1.0000 | ORAL_TABLET | ORAL | Status: DC | PRN

## 2016-05-12 MED ORDER — ONDANSETRON HCL 4 MG PO TABS: 4.0000 mg | ORAL_TABLET | ORAL | Status: DC | PRN

## 2016-05-12 MED ORDER — PRENATAL MULTIVITAMIN CH
1.0000 | ORAL_TABLET | Freq: Every day | ORAL | Status: DC
  Administered 2016-05-12 – 2016-05-13 (×2): 1 via ORAL
  Filled 2016-05-12 (×2): qty 1

## 2016-05-12 MED ORDER — FENTANYL 2.5 MCG/ML W/ROPIVACAINE 0.2% IN NS 100 ML EPIDURAL INFUSION (ARMC-ANES)
EPIDURAL | Status: AC
  Filled 2016-05-12: qty 100

## 2016-05-12 MED ORDER — SODIUM CHLORIDE FLUSH 0.9 % IV SOLN
INTRAVENOUS | Status: AC
  Filled 2016-05-12: qty 30

## 2016-05-12 MED ORDER — IBUPROFEN 600 MG PO TABS
600.0000 mg | ORAL_TABLET | Freq: Four times a day (QID) | ORAL | Status: DC | PRN
  Administered 2016-05-12 – 2016-05-13 (×4): 600 mg via ORAL
  Filled 2016-05-12 (×4): qty 1

## 2016-05-12 MED ORDER — DIBUCAINE 1 % RE OINT: 1.0000 "application " | TOPICAL_OINTMENT | RECTAL | Status: DC | PRN

## 2016-05-12 MED ORDER — VARICELLA VIRUS VACCINE LIVE 1350 PFU/0.5ML IJ SUSR: 0.5000 mL | INTRAMUSCULAR | Status: DC | PRN

## 2016-05-12 MED ORDER — TETANUS-DIPHTH-ACELL PERTUSSIS 5-2.5-18.5 LF-MCG/0.5 IM SUSP: 0.5000 mL | Freq: Once | INTRAMUSCULAR | Status: DC

## 2016-05-12 MED ORDER — SENNOSIDES-DOCUSATE SODIUM 8.6-50 MG PO TABS
2.0000 | ORAL_TABLET | ORAL | Status: DC
Start: 2016-05-13 — End: 2016-05-13
  Administered 2016-05-13: 2 via ORAL
  Filled 2016-05-12: qty 2

## 2016-05-12 MED ORDER — ONDANSETRON HCL 4 MG/2ML IJ SOLN: 4.0000 mg | INTRAMUSCULAR | Status: DC | PRN

## 2016-05-12 MED ORDER — IBUPROFEN 600 MG PO TABS
600.0000 mg | ORAL_TABLET | Freq: Once | ORAL | Status: AC
  Administered 2016-05-12: 600 mg via ORAL
  Filled 2016-05-12: qty 1

## 2016-05-12 MED ORDER — FERROUS SULFATE 325 (65 FE) MG PO TABS
325.0000 mg | ORAL_TABLET | Freq: Every day | ORAL | Status: DC
  Administered 2016-05-12 – 2016-05-13 (×2): 325 mg via ORAL
  Filled 2016-05-12 (×2): qty 1

## 2016-05-12 MED ORDER — SIMETHICONE 80 MG PO CHEW: 80.0000 mg | CHEWABLE_TABLET | ORAL | Status: DC | PRN

## 2016-05-12 MED ORDER — ASPIRIN 81 MG PO CHEW
81.0000 mg | CHEWABLE_TABLET | Freq: Every day | ORAL | Status: DC
  Administered 2016-05-12 – 2016-05-13 (×2): 81 mg via ORAL
  Filled 2016-05-12 (×2): qty 1

## 2016-05-12 MED ORDER — COCONUT OIL OIL: 1.0000 "application " | TOPICAL_OIL | Status: DC | PRN

## 2016-05-12 MED ORDER — WITCH HAZEL-GLYCERIN EX PADS: 1.0000 "application " | MEDICATED_PAD | CUTANEOUS | Status: DC | PRN

## 2016-05-12 NOTE — Progress Notes (Signed)
L&D Progress note  S: "My water broke!" Can I have something else for pain  O: 114/67, 122/72  General: groggy from Stadol FHR: 145 with moderate variability, early decelerations with last 2-3 contractions Toco: contractions q2-4 min apart on 13 miu/min Pitocin SROM at 0110-clear fluid Cervix: 4/75%/-1 to -2  A: s/p SROM Progressing Cat 1 tracing  P: Continue Stadol for pain Monitor progress and FWB closely  Khori Rosevear, CNM

## 2016-05-12 NOTE — Discharge Summary (Signed)
Physician Obstetric Discharge Summary  Patient ID: Vicki Adams MRN: [id number] DOB/AGE: 20/12/1995 20 y.o.   Date of Admission: 05/10/2016  Date of Discharge: 05/13/16  Admitting Diagnosis: non reassuring fetal heart rate tracing at 2220w4d  Secondary Diagnosis: RH negative status  Mode of Delivery: normal spontaneous vaginal delivery 05/12/2016      Discharge Diagnosis: Term intrauterine pregnancy-delivered  Non reassuring fetal heart rate tracing Fetal bradycardia   Intrapartum Procedures: GBS prophylaxis, placement of fetal scalp electrode and Cervidil induction and Pitocin augmentation   Post partum procedures: None  Complications: none   Brief Hospital Course  Vicki Adams is a [id number] who had a SVD on 11/21;  for further details of this delivery, please refer to the delivery note.  Patient had an uncomplicated postpartum course.  By time of discharge on PPD#1, her pain was controlled on oral pain medications; she had appropriate lochia and was ambulating, voiding without difficulty and tolerating regular diet.  She was deemed stable for discharge to home.     Labs: CBC Latest Ref Rng & Units 05/13/2016 05/12/2016 05/11/2016  WBC 3.6 - 11.0 K/uL 10.7 - 10.4  Hemoglobin 12.0 - 16.0 g/dL 10.2(L) - 11.5(L)  Hematocrit 35.0 - 47.0 % 31.8(L) - 35.2  Platelets 150 - 440 K/uL 143(L) 149(L) 160   O NEG/ VNI/ RI/ GBS positive  Physical exam:  Blood pressure 106/61, pulse 69, temperature 98.1 F (36.7 C), temperature source Oral, resp. rate 18, height 5\' 5"  (1.651 m), weight 196 lb (88.9 kg), last menstrual period 08/23/2015, SpO2 99 %, unknown if currently breastfeeding. General: alert and no distress Lochia: appropriate Abdomen: soft, NT Uterine Fundus: firm Extremities: No evidence of DVT seen on physical exam. No lower extremity edema.  Discharge Instructions: Per After Visit Summary. Activity: Advance as tolerated. Pelvic rest for 6 weeks.  Also  refer to Discharge Instructions Diet: Regular Medications:   Medication List    TAKE these medications   ferrous sulfate 325 (65 FE) MG tablet Take 1 tablet (325 mg total) by mouth daily with breakfast. Start taking on:  05/14/2016   norethindrone 0.35 MG tablet Commonly known as:  ORTHO MICRONOR Take 1 tablet (0.35 mg total) by mouth daily.      Outpatient follow up:  Follow-up Information    GUTIERREZ, COLLEEN, CNM Follow up.   Specialty:  Certified Nurse Midwife Why:  Call to make a 6 week postpartum appt Contact information: [address] [place] [address] [phone]          Postpartum contraception: oral contraceptives (estrogen/progesterone)  Discharged Condition: good  Discharged to: home   Newborn Data: Disposition:home with mother  Apgars: APGAR (1 MIN): 7   APGAR (5 MINS): 9   APGAR (10 MINS):    Baby Feeding: Bottle / Hoy MornLevi   Ranae Casebier Paul Brieana Shimmin, MD 05/13/2016 11:11 AM

## 2016-05-12 NOTE — Progress Notes (Signed)
  Postpartum Day 0  Subjective: no complaints, up ad lib, voiding and tolerating PO  Objective: Blood pressure 117/65, pulse 80, temperature 98.7 F (37.1 C), temperature source Oral, resp. rate 20, height 5\' 5"  (1.651 m), weight 196 lb (88.9 kg), last menstrual period 08/23/2015, SpO2 95 %  Physical Exam:  General: alert and cooperative Lochia: appropriate Uterine Fundus: firm Incision: N/A DVT Evaluation: No evidence of DVT seen on physical exam. Abdomen: soft, NT   Recent Labs  05/11/16 0200  HGB 11.5*  HCT 35.2    Assessment PPD # 0  Plan: Continue PP care and Advance activity as tolerated  Feeding: breast Contraception: undecided Blood Type: O neg (baby O+, rhogam pending) RI/VNI - declines vaccine at this time TDAP declines   Marta AntuBrothers, Nance Mccombs, [place] 05/12/2016, 10:56 AM

## 2016-05-13 ENCOUNTER — Emergency Department
Admission: EM | Admit: 2016-05-13 | Discharge: 2016-05-13 | Disposition: A | Discharge status: Home / Self Care | Payer: Medicaid Other | Attending: Emergency Medicine | Admitting: Emergency Medicine

## 2016-05-13 ENCOUNTER — Encounter: Payer: Self-pay | Admitting: Emergency Medicine

## 2016-05-13 ENCOUNTER — Emergency Department: Payer: Medicaid Other

## 2016-05-13 LAB — CBC
HEMATOCRIT: 31.8 % — AB (ref 35.0–47.0)
HEMATOCRIT: 32.1 % — AB (ref 35.0–47.0)
HEMOGLOBIN: 10.2 g/dL — AB (ref 12.0–16.0)
HEMOGLOBIN: 10.6 g/dL — AB (ref 12.0–16.0)
MCH: 24.1 pg — ABNORMAL LOW (ref 26.0–34.0)
MCH: 24.8 pg — AB (ref 26.0–34.0)
MCHC: 32.2 g/dL (ref 32.0–36.0)
MCHC: 33.2 g/dL (ref 32.0–36.0)
MCV: 74.8 fL — AB (ref 80.0–100.0)
MCV: 74.9 fL — AB (ref 80.0–100.0)
Platelets: 143 10*3/uL — ABNORMAL LOW (ref 150–440)
Platelets: 168 10*3/uL (ref 150–440)
RBC: 4.24 MIL/uL (ref 3.80–5.20)
RBC: 4.28 MIL/uL (ref 3.80–5.20)
RDW: 18.1 % — AB (ref 11.5–14.5)
RDW: 18.3 % — AB (ref 11.5–14.5)
WBC: 10.7 10*3/uL (ref 3.6–11.0)
WBC: 10 10*3/uL (ref 3.6–11.0)

## 2016-05-13 LAB — TYPE AND SCREEN
ABO/RH(D): O NEG
ANTIBODY SCREEN: POSITIVE
UNIT DIVISION: 0
Unit division: 0

## 2016-05-13 LAB — COMPREHENSIVE METABOLIC PANEL
ALBUMIN: 2.8 g/dL — AB (ref 3.5–5.0)
ALK PHOS: 100 U/L (ref 38–126)
ALT: 11 U/L — ABNORMAL LOW (ref 14–54)
AST: 20 U/L (ref 15–41)
Anion gap: 7 (ref 5–15)
BUN: 9 mg/dL (ref 6–20)
CALCIUM: 8.8 mg/dL — AB (ref 8.9–10.3)
CO2: 24 mmol/L (ref 22–32)
Chloride: 103 mmol/L (ref 101–111)
Creatinine, Ser: 0.81 mg/dL (ref 0.44–1.00)
GFR calc Af Amer: >60 mL/min (ref >60)
GLUCOSE: 85 mg/dL (ref 65–99)
POTASSIUM: 3.7 mmol/L (ref 3.5–5.1)
Sodium: 134 mmol/L — ABNORMAL LOW (ref 135–145)
TOTAL PROTEIN: 6.3 g/dL — AB (ref 6.5–8.1)

## 2016-05-13 LAB — PREGNANCY, URINE: PREG TEST UR: POSITIVE — AB

## 2016-05-13 LAB — FETAL SCREEN: FETAL SCREEN: NEGATIVE

## 2016-05-13 MED ORDER — FERROUS SULFATE 325 (65 FE) MG PO TABS: 325.0000 mg | ORAL_TABLET | Freq: Every day | ORAL | 1 refills | Status: DC

## 2016-05-13 MED ORDER — NORETHINDRONE 0.35 MG PO TABS: 1.0000 | ORAL_TABLET | Freq: Every day | ORAL | 11 refills | Status: DC

## 2016-05-13 MED ORDER — RHO D IMMUNE GLOBULIN 1500 UNIT/2ML IJ SOSY
300.0000 ug | PREFILLED_SYRINGE | Freq: Once | INTRAMUSCULAR | Status: AC
  Administered 2016-05-13: 300 ug via INTRAMUSCULAR
  Filled 2016-05-13: qty 2

## 2016-05-13 NOTE — Progress Notes (Signed)
Patient discharged home with infant. Vital signs stable, bleeding within normal limits, uterus firm. Discharge instructions, prescriptions, and follow up appointment given to and reviewed with patient. Patient verbalized understanding, all questions answered. Escorted in wheelchair by auxiliary.    Tajae Maiolo, RN  

## 2016-05-13 NOTE — Progress Notes (Signed)
  Postpartum Day 1  Subjective: no complaints, up ad lib, voiding and tolerating PO  Objective: Blood pressure 117/65, pulse 80, temperature 98.7 F (37.1 C), temperature source Oral, resp. rate 20, height 5\' 5"  (1.651 m), weight 196 lb (88.9 kg), last menstrual period 08/23/2015, SpO2 95 %  Physical Exam:  General: alert and cooperative Lochia: appropriate Uterine Fundus: firm Incision: N/A DVT Evaluation: No evidence of DVT seen on physical exam. Abdomen: soft, NT   Recent Labs  05/11/16 0200 05/13/16 0630  HGB 11.5* 10.2*  HCT 35.2 31.8*    Assessment PPD # 1.  Desires to go home.  Plan: Continue PP care and Advance activity as tolerated  Feeding: breast Contraception: Pill Blood Type: O neg (baby O+, rhogam pending) RI/VNI - declines vaccine at this time TDAP declines   Vicki Libraobert Paul Deyja Sochacki, MD 05/13/2016, 11:14 AM

## 2016-05-13 NOTE — ED Notes (Signed)
Reviewed d/c instructions, follow-up care with patient. Pt verbalized understanding.  

## 2016-05-13 NOTE — ED Provider Notes (Signed)
[facility] Emergency Department Provider Note  ____________________________________________   I have reviewed the triage vital signs and the nursing notes.   HISTORY  Chief Complaint Postpartum Complications    HPI Vicki Adams is a 20 y.o. female who presents today complaining of feeling some fluid come from her vagina. She gave birth yesterday by normal vaginal delivery. Patient has no dysuria no urinary frequency no condoms of bowel or bladder, she does not believe it was urine but she is not sure. She has no abdominal pain she is passing a minimal amount of lochia which is normal. Patient has had no other complaints.  **   Past Medical History:  Diagnosis Date  . Anemia   . [facility]um ASD    [facility]  . Syncope and collapse     Patient Active Problem List   Diagnosis Date Noted  . Postpartum care following vaginal delivery 05/12/2016  . Congenital heart disease, maternal, antepartum 01/23/2016  . History of percutaneous transcatheter closure of congenital ASD 01/24/2015  . History of esophageal hernia repair 01/24/2015    Past Surgical History:  Procedure Laterality Date  . CARDIAC SURGERY  11/2000  . HERNIA REPAIR    . HERNIA REPAIR  06/1995   es[facility] 72 [facility]    Prior to Admission medications   Medication Sig Start Date End Date Taking? Authorizing Provider  ferrous sulfate 325 (65 FE) MG tablet Take 1 tablet (325 mg total) by mouth daily with breakfast. 05/14/16   Nadara Mustardobert P Harris, MD  norethindrone (ORTHO MICRONOR) 0.35 MG tablet Take 1 tablet (0.35 mg total) by mouth daily. 05/13/16   Nadara Mustardobert P Harris, MD    Allergies Erythromycin  Family History  Problem Relation Age of Onset  . Migraines Mother   . Kidney disease Father   . Obesity Father   . Heart failure Father   . Scoliosis Sister   . Emphysema Maternal Grandmother   . Heart failure Maternal Grandmother      Social History Social History  Substance Use Topics  . Smoking status: Never Smoker  . Smokeless tobacco: Never Used  . Alcohol use No     Comment: occ    Review of Systems Constitutional: No fever/chills Eyes: No visual changes. ENT: No sore throat. No stiff neck no neck pain Cardiovascular: Denies chest pain. Respiratory: Denies shortness of breath. Gastrointestinal:   no vomiting.  No diarrhea.  No constipation. Genitourinary: Negative for dysuria. Musculoskeletal: Negative lower extremity swelling Skin: Negative for rash. Neurological: Negative for severe headaches, focal weakness or numbness. 10-point ROS otherwise negative.  ____________________________________________   PHYSICAL EXAM:  VITAL SIGNS: ED Triage Vitals  Enc Vitals Group     BP 05/13/16 1911 128/77     Pulse Rate 05/13/16 1911 80     Resp 05/13/16 1911 18     Temp 05/13/16 1911 98.2 F (36.8 C)     Temp Source 05/13/16 1911 Oral     SpO2 05/13/16 1911 99 %     Weight 05/13/16 1912 178 lb (80.7 kg)     Height 05/13/16 1912 5\' 5"  (1.651 m)     Head Circumference --      Peak Flow --      Pain Score 05/13/16 1918 2     Pain Loc --      Pain Edu? --      Excl. in GC? --     Constitutional:  Alert and oriented. Well appearing and in no acute distress. Eyes: Conjunctivae are normal. PERRL. EOMI. Head: Atraumatic. Nose: No congestion/rhinnorhea. Mouth/Throat: Mucous membranes are moist.  Oropharynx non-erythematous. Neck: No stridor.   Nontender with no meningismus Cardiovascular: Normal rate, regular rhythm. Grossly normal heart sounds.  Good peripheral circulation. Respiratory: Normal respiratory effort.  No retractions. Lungs CTAB. Abdominal: Soft and nontender. No distention. No guarding no rebound Back:  There is no focal tenderness or step off.  there is no midline tenderness there are no lesions noted. there is no CVA tenderness GU: Female nurse present, patient declined speculum exam,  no evidence of lesions noted to gross inspection. Musculoskeletal: No lower extremity tenderness, no upper extremity tenderness. No joint effusions, no DVT signs strong distal pulses no edema Neurologic:  Normal speech and language. No gross focal neurologic deficits are appreciated.  Skin:  Skin is warm, dry and intact. No rash noted. Psychiatric: Mood and affect are normal. Speech and behavior are normal.  ____________________________________________   LABS (all labs ordered are listed, but only abnormal results are displayed)  Labs Reviewed  CBC - Abnormal; Notable for the following:       Result Value   Hemoglobin 10.6 (*)    HCT 32.1 (*)    MCV 74.9 (*)    MCH 24.8 (*)    RDW 18.3 (*)    All other components within normal limits  COMPREHENSIVE METABOLIC PANEL - Abnormal; Notable for the following:    Sodium 134 (*)    Calcium 8.8 (*)    Total Protein 6.3 (*)    Albumin 2.8 (*)    ALT 11 (*)    Total Bilirubin <0.1 (*)    All other components within normal limits  PREGNANCY, URINE - Abnormal; Notable for the following:    Preg Test, Ur POSITIVE (*)    All other components within normal limits   ____________________________________________  EKG  I personally interpreted any EKGs ordered by me or triage  ____________________________________________  RADIOLOGY  I reviewed any imaging ordered by me or triage that were performed during my shift and, if possible, patient and/or family made aware of any abnormal findings. ____________________________________________   PROCEDURES  Procedure(s) performed: None  Procedures  Critical Care performed: None  ____________________________________________   INITIAL IMPRESSION / ASSESSMENT AND PLAN / ED COURSE  Pertinent labs & imaging results that were available during my care of the patient were reviewed by me and considered in my medical decision making (see chart for details).  Patient had the passage of some fluid  is unclear exactly what it was no evidence of urinary incontinence she has had normal urinary output since that time. No evidence of urinary retention no evidence of cauda equina syndrome or neurologic deficit, patient is neurologically intact at this time. No evidence of significant vaginal bleeding or intra-abdominal pathology, abdomen is completely benign. She certainly could've had some stress incontinence leaning over the bed. We will be unable to check a urinalysis reliably without a catheter urine which patient would prefer not to have at this point which I do not think is unreasonable. We discussed with Dr. Tiburcio PeaHarris, who knows the patient well and saw her this morning. He doesn't have any further recommendations. Does not wish any further exam does not think that we need to check a urine does not think we need an ultrasound, he will follow up with her in the office. Patient very comfortable with this plan. No symptoms while here. Since her  return precautions and follow-up given and understood  Clinical Course    ____________________________________________   FINAL CLINICAL IMPRESSION(S) / ED DIAGNOSES  Final diagnoses:  Retained products of conception, postpartum      This chart was dictated using voice recognition software.  Despite best efforts to proofread,  errors can occur which can change meaning.      Jeanmarie Plant, MD 05/13/16 [phone]

## 2016-05-13 NOTE — ED Notes (Signed)
Pt. States she was discharged from hospital today at around 2 pm.  Pt. States when she got home she was bending over crib and felt a "pop" similar to when he water broke at hospital early that day.  Pt. States it was enough fluid to soak pad and her pants.  Pt. In no distress or discomfort at this time.

## 2016-05-13 NOTE — Discharge Instructions (Signed)
Return to the emergency department if you have any new or worrisome symptoms including fever, weakness, increased bleeding, pain when you urinate, or if you have any other concerns. Follow up closely with your OB in the next few days.

## 2016-05-13 NOTE — Discharge Instructions (Signed)
Vaginal Delivery, Care After Refer to this sheet in the next few weeks. These discharge instructions provide you with information on caring for yourself after delivery. Your caregiver may also give you specific instructions. Your treatment has been planned according to the most current medical practices available, but problems sometimes occur. Call your caregiver if you have any problems or questions after you go home. HOME CARE INSTRUCTIONS 1. Take over-the-counter or prescription medicines only as directed by your caregiver or pharmacist. 2. Do not drink alcohol, especially if you are breastfeeding or taking medicine to relieve pain. 3. Do not smoke tobacco. 4. Continue to use good perineal care. Good perineal care includes: 1. Wiping your perineum from front to back. 2. Keeping your perineum clean. 3. You can do sitz baths twice a day, to help keep this area clean 5. Do not use tampons, douche or have sex for the next 6 weeks.  6. Shower only for the next 6 weeks and avoid sitting in submerged water, aside from sitz baths 7. Wear a well-fitting bra that provides breast support. 8. Eat healthy foods. 9. Drink enough fluids to keep your urine clear or pale yellow. 10. Eat high-fiber foods such as whole grain cereals and breads, brown rice, beans, and fresh fruits and vegetables every day. These foods may help prevent or relieve constipation. 11. Avoid constipation with high fiber foods or medications, such as miralax or metamucil 12. Follow your caregiver's recommendations regarding resumption of activities such as climbing stairs, driving, lifting, exercising, or traveling. 13. Try to have someone help you with your household activities and your newborn for at least a few days after you leave the hospital. 14. Rest as much as possible. Try to rest or take a nap when your newborn is sleeping. 15. Increase your activities gradually. 16. Keep all of your scheduled postpartum appointments. It is  very important to keep your scheduled follow-up appointments. At these appointments, your caregiver will be checking to make sure that you are healing physically and emotionally. SEEK MEDICAL CARE IF:   You are passing large clots from your vagina. Save any clots to show your caregiver.  You have a foul smelling discharge from your vagina.  You have trouble urinating.  You are urinating frequently.  You have pain when you urinate.  You have a change in your bowel movements.  You have painful, hard, or reddened breasts.  You have a severe headache.  You have blurred vision or see spots.  You feel sad or depressed.  You have thoughts of hurting yourself or your newborn.  You have questions about your care, the care of your newborn, or medicines.  You are dizzy or light-headed.  You have a rash.  You have nausea or vomiting.  You were breastfeeding and have not had a menstrual period within 12 weeks after you stopped breastfeeding.  You are not breastfeeding and have not had a menstrual period by the 12th week after delivery.  You have a fever. SEEK IMMEDIATE MEDICAL CARE IF:   You have persistent pain.  You have chest pain.  You have shortness of breath.  You faint.  You have leg pain.  You have stomach pain.  Your vaginal bleeding saturates two or more sanitary pads in 1 hour. MAKE SURE YOU:   Understand these instructions.  Will watch your condition.  Will get help right away if you are not doing well or get worse. Document Released: 06/05/2000 Document Revised: 10/23/2013 Document Reviewed: 02/03/2012 ExitCare Patient  Information 2015 [employer] [place]. This information is not intended to replace advice given to you by your health care provider. Make sure you discuss any questions you have with your health care provider.  Sitz Bath A sitz bath is a warm water bath taken in the sitting position. The water covers only the hips and butt (buttocks). We  recommend using one that fits in the toilet, to help with ease of use and cleanliness. It may be used for either healing or cleaning purposes. Sitz baths are also used to relieve pain, itching, or muscle tightening (spasms). The water may contain medicine. Moist heat will help you heal and relax.  HOME CARE  Take 3 to 4 sitz baths a day. 17. Fill the bathtub half-full with warm water. 18. Sit in the water and open the drain a little. 19. Turn on the warm water to keep the tub half-full. Keep the water running constantly. 20. Soak in the water for 15 to 20 minutes. 21. After the sitz bath, pat the affected area dry. GET HELP RIGHT AWAY IF: You get worse instead of better. Stop the sitz baths if you get worse. MAKE SURE YOU:  Understand these instructions.  Will watch your condition.  Will get help right away if you are not doing well or get worse. Document Released: 07/16/2004 Document Revised: 03/02/2012 Document Reviewed: 10/06/2010 [facility] Patient Information 2015 [place] [place]. This information is not intended to replace advice given to you by your health care provider. Make sure you discuss any questions you have with your health care provider.

## 2016-05-13 NOTE — ED Triage Notes (Signed)
Pt arrived to the ED 1 day post  Natural partum stating "my water broke when I got home." Pt states that when she was released from the hospital today she had an urge to push and fluid came out of her vagina "just like if her water broke." Pt states that it can not be urine because she smelled it and it smelled sweet. Pt reports back pain and cramping. Pt is AOx4 in no apparent distress.

## 2016-05-14 LAB — RHOGAM INJECTION: UNIT DIVISION: 0

## 2016-09-24 ENCOUNTER — Encounter: Payer: Self-pay | Admitting: Emergency Medicine

## 2016-09-24 ENCOUNTER — Emergency Department
Admission: EM | Admit: 2016-09-24 | Discharge: 2016-09-24 | Disposition: A | Discharge status: Home / Self Care | Payer: Medicaid Other | Attending: Emergency Medicine | Admitting: Emergency Medicine

## 2016-09-24 DIAGNOSIS — Z79899 Other long term (current) drug therapy: Secondary | ICD-10-CM | POA: Diagnosis not present

## 2016-09-24 DIAGNOSIS — G43809 Other migraine, not intractable, without status migrainosus: Secondary | ICD-10-CM | POA: Diagnosis not present

## 2016-09-24 DIAGNOSIS — R51 Headache: Secondary | ICD-10-CM | POA: Diagnosis present

## 2016-09-24 LAB — CBC
HEMATOCRIT: 39.3 % (ref 35.0–47.0)
HEMOGLOBIN: 13.1 g/dL (ref 12.0–16.0)
MCH: 26.6 pg (ref 26.0–34.0)
MCHC: 33.2 g/dL (ref 32.0–36.0)
MCV: 80.2 fL (ref 80.0–100.0)
Platelets: 179 10*3/uL (ref 150–440)
RBC: 4.9 MIL/uL (ref 3.80–5.20)
RDW: 15 % — AB (ref 11.5–14.5)
WBC: 7.7 10*3/uL (ref 3.6–11.0)

## 2016-09-24 LAB — URINALYSIS, COMPLETE (UACMP) WITH MICROSCOPIC
BACTERIA UA: NONE SEEN
Bilirubin Urine: NEGATIVE
Glucose, UA: NEGATIVE mg/dL
HGB URINE DIPSTICK: NEGATIVE
Ketones, ur: NEGATIVE mg/dL
Leukocytes, UA: NEGATIVE
NITRITE: NEGATIVE
Protein, ur: NEGATIVE mg/dL
RBC / HPF: NONE SEEN RBC/hpf (ref 0–5)
SPECIFIC GRAVITY, URINE: 1.02 (ref 1.005–1.030)
pH: 5 (ref 5.0–8.0)

## 2016-09-24 LAB — BASIC METABOLIC PANEL
ANION GAP: 7 (ref 5–15)
BUN: 12 mg/dL (ref 6–20)
CHLORIDE: 106 mmol/L (ref 101–111)
CO2: 23 mmol/L (ref 22–32)
Calcium: 8.8 mg/dL — ABNORMAL LOW (ref 8.9–10.3)
Creatinine, Ser: 0.84 mg/dL (ref 0.44–1.00)
GFR calc non Af Amer: >60 mL/min (ref >60)
GLUCOSE: 112 mg/dL — AB (ref 65–99)
POTASSIUM: 3.6 mmol/L (ref 3.5–5.1)
Sodium: 136 mmol/L (ref 135–145)

## 2016-09-24 MED ORDER — BUTALBITAL-APAP-CAFFEINE 50-325-40 MG PO TABS: 1.0000 | ORAL_TABLET | Freq: Four times a day (QID) | ORAL | 0 refills | Status: DC | PRN

## 2016-09-24 MED ORDER — KETOROLAC TROMETHAMINE 30 MG/ML IJ SOLN
30.0000 mg | Freq: Once | INTRAMUSCULAR | Status: AC
  Administered 2016-09-24: 30 mg via INTRAMUSCULAR
  Filled 2016-09-24: qty 1

## 2016-09-24 NOTE — ED Triage Notes (Signed)
Pt comes into the ED via POV c/o headache that has been going on for 4 days.  Patient states she has light sensitivity and a h/o migraines in the past.  Patient also states she is having flank pain on the right side.  Denies any chest pain, dizziness, or shortness of breath.  Patient in NAD at this time with even and unlabored respirations and is neurologically intact.

## 2016-09-24 NOTE — ED Notes (Signed)
POC preg NEGATIVE 

## 2016-09-24 NOTE — ED Provider Notes (Signed)
[facility] Emergency Department Provider Note   ____________________________________________    I have reviewed the triage vital signs and the nursing notes.   HISTORY  Chief Complaint Headache and Flank Pain     HPI Vicki Adams is a 21 y.o. female who presents primarily with complaints of headache and photosensitive. Patient reports over the last several days she has had photosensitivity and a throbbing bifrontal headache. She reports she has had headaches like this in the past but typically they resolve after a day. She is taking ibuprofen without relief. She denies neuro deficits. No fevers or chills. No neck pain. No nausea or vomiting. She reports lack of sleep is likely contributing to her headache   Past Medical History:  Diagnosis Date  . Anemia   . 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 Syncope and collapse     Patient Active Problem List   Diagnosis Date Noted  . Postpartum care following vaginal delivery 05/12/2016  . Congenital heart disease, maternal, antepartum 01/23/2016  . History of percutaneous transcatheter closure of congenital ASD 01/24/2015  . History of esophageal hernia repair 01/24/2015    Past Surgical History:  Procedure Laterality Date  . CARDIAC SURGERY  11/2000  . HERNIA REPAIR    . HERNIA REPAIR  05-Mar-1996   esophageal hernia repair done at 24 weeks of age at [facility]    Prior to Admission medications   Medication Sig Start Date End Date Taking? Authorizing Provider  ferrous sulfate 325 (65 FE) MG tablet Take 1 tablet (325 mg total) by mouth daily with breakfast. 05/14/16  Yes Nadara Mustard, MD  norethindrone (ORTHO MICRONOR) 0.35 MG tablet Take 1 tablet (0.35 mg total) by mouth daily. 05/13/16  Yes Nadara Mustard, MD  butalbital-acetaminophen-caffeine (FIORICET, [facility]) [phone] MG tablet Take 1-2 tablets by mouth every 6 (six) hours as needed for headache. 09/24/16 09/24/17  Jene Every, MD      Allergies Erythromycin  Family History  Problem Relation Age of Onset  . Migraines Mother   . Kidney disease Father   . Obesity Father   . Heart failure Father   . Scoliosis Sister   . Emphysema Maternal Grandmother   . Heart failure Maternal Grandmother     Social History Social History  Substance Use Topics  . Smoking status: Never Smoker  . Smokeless tobacco: Never Used  . Alcohol use No     Comment: occ    Review of Systems  Constitutional: No fever/chills Eyes: Photophobia ENT: No Neck pain Cardiovascular: Denies chest pain. Respiratory: Denies shortness of breath. Gastrointestinal: No abdominal pain.  No nausea, no vomiting.    Musculoskeletal: Negative for back pain. No neck pain Skin: Negative for rash. Neurological: Negative for focal weakness  10-point ROS otherwise negative.  ____________________________________________   PHYSICAL EXAM:  VITAL SIGNS: ED Triage Vitals  Enc Vitals Group     BP 09/24/16 1120 123/64     Pulse Rate 09/24/16 1120 75     Resp 09/24/16 1120 18     Temp 09/24/16 1120 98.4 F (36.9 C)     Temp Source 09/24/16 1120 Oral     SpO2 09/24/16 1120 96 %     Weight 09/24/16 1121 178 lb (80.7 kg)     Height 09/24/16 1121  (1.702 m)     Head Circumference --      Peak Flow --      Pain Score 09/24/16 1122  10     Pain Loc --      Pain Edu? --      Excl. in GC? --     Constitutional: Alert and oriented. No acute distress. Wearing sunglasses Eyes: Conjunctivae are normal. PERRLA, some light sensitivity Head: Atraumatic. Nose: No congestion/rhinnorhea. Mouth/Throat: Mucous membranes are moist.   Neck:  Painless ROM Cardiovascular: Normal rate, regular rhythm.Peri [place] peripheral circulation. Respiratory: Normal respiratory effort.  No retractions. Gastrointestinal: Soft and nontender. No distention.   Genitourinary: deferred Musculoskeletal:  Warm and well perfused Neurologic:  Normal speech and language. No gross  focal neurologic deficits are appreciated. Cranial nerves II-12 are normal Skin:  Skin is warm, dry and intact. No rash noted. Psychiatric: Mood and affect are normal. Speech and behavior are normal.  ____________________________________________   LABS (all labs ordered are listed, but only abnormal results are displayed)  Labs Reviewed  URINALYSIS, COMPLETE (UACMP) WITH MICROSCOPIC - Abnormal; Notable for the following:       Result Value   Color, Urine YELLOW (*)    APPearance CLEAR (*)    Squamous Epithelial / LPF 0-5 (*)    All other components within normal limits  CBC - Abnormal; Notable for the following:    RDW 15.0 (*)    All other components within normal limits  BASIC METABOLIC PANEL - Abnormal; Notable for the following:    Glucose, Bld 112 (*)    Calcium 8.8 (*)    All other components within normal limits  POC URINE PREG, ED   ____________________________________________  EKG  None ____________________________________________  RADIOLOGY  None ____________________________________________   PROCEDURES  Procedure(s) performed: No    Critical Care performed: No ____________________________________________   INITIAL IMPRESSION / ASSESSMENT AND PLAN / ED COURSE  Pertinent labs & imaging results that were available during my care of the patient were reviewed by me and considered in my medical decision making (see chart for details).  Patient overall well-appearing and in no acute distress. She reports the time I saw her her headache is already improved somewhat from a "12" to a 6. Her neuro exam is normal. Do not feel there is need for imaging as this is typical headache for her. We will treat with IM Toradol.   After IM Toradol patient reports significant improvement. She is no longer wearing her sunglasses and would like to go home and sleep. I think this is a good idea all discharge her with fioricet. Return precautions discussed      ____________________________________________   FINAL CLINICAL IMPRESSION(S) / ED DIAGNOSES  Final diagnoses:  Other migraine without status migrainosus, not intractable      NEW MEDICATIONS STARTED DURING THIS VISIT:  Discharge Medication List as of 09/24/2016  3:03 PM    START taking these medications   Details  butalbital-acetaminophen-caffeine (FIORICET, ESGIC) 50-325-40 MG tablet Take 1-2 tablets by mouth every 6 (six) hours as needed for headache., Starting Thu 09/24/2016, Until Fri 09/24/2017, Print         Note:  This document was prepared using Dragon voice recognition software and may include unintentional dictation errors.    Jene Every, MD 09/24/16 [phone]

## 2016-09-26 LAB — POCT PREGNANCY, URINE: PREG TEST UR: NEGATIVE

## 2016-10-14 ENCOUNTER — Encounter: Payer: Self-pay | Admitting: Certified Nurse Midwife

## 2016-10-14 ENCOUNTER — Ambulatory Visit (INDEPENDENT_AMBULATORY_CARE_PROVIDER_SITE_OTHER): Payer: Medicaid Other | Admitting: Certified Nurse Midwife

## 2016-10-14 VITALS — BP 102/62 | HR 93 | Ht 67.0 in | Wt 189.0 lb

## 2016-10-14 DIAGNOSIS — Z3041 Encounter for surveillance of contraceptive pills: Secondary | ICD-10-CM

## 2016-10-14 MED ORDER — LEVONORGESTREL-ETHINYL ESTRAD 0.15-30 MG-MCG PO TABS: 1.0000 | ORAL_TABLET | Freq: Every day | ORAL | 11 refills | Status: DC

## 2016-10-14 NOTE — Progress Notes (Signed)
  History of Present Illness:  Vicki Adams is a 21 y.o. G2P2 who was started on Sprintec after her 6 week postpartum check up. She has 16 month and  27 month old babies. She reports having lower abdominal cramping about an hour after she takes a birth control pill and she is bleeding other than when she is on the placebo pills. She has missed pills. Is thinking about having another baby and then having her tubes tied. Discussed the high regret rate with getting a permanent procedure done like a tubal and that young women are encouraged to get LARCs until they are older.     PMHx: She  has a past medical history of Anemia; Secundum ASD; and Syncope and collapse. Also,  has a past surgical history that includes Cardiac surgery (11/2000) and Hernia repair (1995/10/10)., family history includes Emphysema in her maternal grandmother; Heart failure in her father and maternal grandmother; Kidney disease in her father; Migraines in her mother; Obesity in her father; Scoliosis in her sister.,  reports that she has never smoked. She has never used smokeless tobacco. She reports that she does not drink alcohol or use drugs.  She has a current medication list which includes the following prescription(s): sprintec 28. Also, is allergic to erythromycin.  ROS  Physical Exam:  BP 102/62   Pulse 93   Ht  (1.702 m)   Wt 189 lb (85.7 kg)   LMP 09/14/2016 (Exact Date)   BMI 29.60 kg/m  Body mass index is 29.6 kg/m. Constitutional: Well nourished, well developed female in no acute distress. Presents with her active 41 mos old child Neuro: Grossly intact Psych:  Normal mood and affect.    Assessment: Lower abdominal cramping? And breakthrough bleeding on Sprintec  Plan: Will switch patient to [employer] birth control pills. Encouraged her to take the pill at the same time daily to decrease BTB.  She was amenable to this plan and we will see her back for annual/PRN.  Farrel Conners, CNM [facility], [facility] 10/14/2016  12:05 PM Addendum: 1:15 PM Called patient and advised to take pregnancy test and discussed when to start new prescription if her pregnancy test is negative. TO call me if her pregnancy test is positive,  Farrel Conners, CNM

## 2017-03-28 ENCOUNTER — Emergency Department
Admission: EM | Admit: 2017-03-28 | Discharge: 2017-03-28 | Disposition: A | Discharge status: Home / Self Care | Payer: Medicaid Other | Attending: Emergency Medicine | Admitting: Emergency Medicine

## 2017-03-28 DIAGNOSIS — Z79899 Other long term (current) drug therapy: Secondary | ICD-10-CM | POA: Insufficient documentation

## 2017-03-28 DIAGNOSIS — R070 Pain in throat: Secondary | ICD-10-CM | POA: Insufficient documentation

## 2017-03-28 DIAGNOSIS — R05 Cough: Secondary | ICD-10-CM | POA: Insufficient documentation

## 2017-03-28 DIAGNOSIS — B349 Viral infection, unspecified: Secondary | ICD-10-CM | POA: Insufficient documentation

## 2017-03-28 DIAGNOSIS — R0981 Nasal congestion: Secondary | ICD-10-CM | POA: Diagnosis present

## 2017-03-28 MED ORDER — PSEUDOEPH-BROMPHEN-DM 30-2-10 MG/5ML PO SYRP: 10.0000 mL | ORAL_SOLUTION | Freq: Four times a day (QID) | ORAL | 0 refills | Status: DC | PRN

## 2017-03-28 NOTE — ED Notes (Signed)
Pt states no pain at this time.  Pt states only having congestion.

## 2017-03-28 NOTE — ED Provider Notes (Signed)
[facility] Emergency Department Provider Note  ____________________________________________  Time seen: Approximately 7:13 PM  I have reviewed the triage vital signs and the nursing notes.   HISTORY  Chief Complaint Influenza    HPI Vicki Adams is a 21 y.o. female who presents emergency department for multiple complaints. Patient reports that she has had nasal congestion, sore throat, cough, ear pressure, fevers and chills, body aches 3 days. Symptoms began insidiously. Symptoms began with nasal congestion and progressed to include sore throat, cough, fevers and chills, body aches. Patient reports intermittent mild headache, but no visual changes, neck pain or stiffness, chest pain, shortness of breath, abdominal pain, nausea vomiting, diarrhea or constipation.Patient has tried Tylenol, Mucinex," and flu medication with only mild relief. No other medications prior to arrival. No other complaints at this time.   Past Medical History:  Diagnosis Date  . Anemia   . 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 Syncope and collapse     Patient Active Problem List   Diagnosis Date Noted  . Congenital heart disease, maternal, antepartum 01/23/2016  . History of percutaneous transcatheter closure of congenital ASD 01/24/2015  . History of esophageal hernia repair 01/24/2015    Past Surgical History:  Procedure Laterality Date  . CARDIAC SURGERY  11/2000  . HERNIA REPAIR  09/25/95   congential diaphragmatic hernia repair done at 48 weeks of age at [facility]    Prior to Admission medications   Medication Sig Start Date End Date Taking? Authorizing Provider  brompheniramine-pseudoephedrine-DM 30-2-10 MG/5ML syrup Take 10 mLs by mouth 4 (four) times daily as needed. 03/28/17   Cuthriell, Delorise Royals, PA-C  levonorgestrel-ethinyl estradiol (NORDETTE) 0.15-30 MG-MCG tablet Take 1 tablet by mouth daily. 10/14/16   Farrel Conners, CNM     Allergies Erythromycin  Family History  Problem Relation Age of Onset  . Migraines Mother   . Kidney disease Father   . Obesity Father   . Heart failure Father   . Scoliosis Sister   . Emphysema Maternal Grandmother   . Heart failure Maternal Grandmother     Social History Social History  Substance Use Topics  . Smoking status: Never Smoker  . Smokeless tobacco: Never Used  . Alcohol use No     Comment: occ     Review of Systems  Constitutional: positive fever/chills. As of the body aches. Eyes: No visual changes. No discharge ENT: positive for nasal congestion, ear pressure, sore throat. Cardiovascular: no chest pain. Respiratory: positive cough. No SOB. Gastrointestinal: No abdominal pain.  No nausea, no vomiting.  No diarrhea.  No constipation. Musculoskeletal: Negative for musculoskeletal pain. Skin: Negative for rash, abrasions, lacerations, ecchymosis. Neurological: intermittent mild headache but denies focal weakness or numbness. 10-point ROS otherwise negative.  ____________________________________________   PHYSICAL EXAM:  VITAL SIGNS: ED Triage Vitals  Enc Vitals Group     BP 03/28/17 1803 112/69     Pulse Rate 03/28/17 1803 96     Resp 03/28/17 1803 19     Temp 03/28/17 1803 99.6 F (37.6 C)     Temp Source 03/28/17 1803 Oral     SpO2 03/28/17 1803 97 %     Weight 03/28/17 1804 180 lb (81.6 kg)     Height 03/28/17 1804  (1.778 m)     Head Circumference --      Peak Flow --      Pain Score --      Pain Loc --  Pain Edu? --      Excl. in GC? --      Constitutional: Alert and oriented. Well appearing and in no acute distress. Eyes: Conjunctivae are normal. PERRL. EOMI. Head: Atraumatic. ENT:      Ears: EACs and TMs are unremarkable bilaterally.      Nose: moderate congestion/rhinnorhea.      Mouth/Throat: Mucous membranes are moist. Oropharynx is mildly erythematous but not edematous. Uvula is midline. Tonsils are mildly  erythematous but nonedematous. No exudates. Neck: No stridor. Neck is supple with full range of motion Hematological/Lymphatic/Immunilogical: scattered, mobile, nontender anterior cervical lymphadenopathy. Cardiovascular: Normal rate, regular rhythm. Normal S1 and S2.  Good peripheral circulation. Respiratory: Normal respiratory effort without tachypnea or retractions. Lungs CTAB. Good air entry to the bases with no decreased or absent breath sounds. Musculoskeletal: Full range of motion to all extremities. No gross deformities appreciated. Neurologic:  Normal speech and language. No gross focal neurologic deficits are appreciated.  Skin:  Skin is warm, dry and intact. No rash noted. Psychiatric: Mood and affect are normal. Speech and behavior are normal. Patient exhibits appropriate insight and judgement.   ____________________________________________   LABS (all labs ordered are listed, but only abnormal results are displayed)  Labs Reviewed - No data to display ____________________________________________  EKG   ____________________________________________  RADIOLOGY   No results found.  ____________________________________________    PROCEDURES  Procedure(s) performed:    Procedures    Medications - No data to display   ____________________________________________   INITIAL IMPRESSION / ASSESSMENT AND PLAN / ED COURSE  Pertinent labs & imaging results that were available during my care of the patient were reviewed by me and considered in my medical decision making (see chart for details).  Review of the [place] CSRS was performed in accordance of the [facility] prior to dispensing any controlled drugs.     Patient's diagnosis is consistent with viral illness. Negative strep test. Patient's exam was reassuring. At this time, no indication for labs or imaging. Differential includes viral URI, viral pharyngitis, influenza. Patient will be discharged home with  prescriptions for Bromfed cough syrup for symptom control. Patient is to follow up with primary care as needed or otherwise directed. Patient is given ED precautions to return to the ED for any worsening or new symptoms.     ____________________________________________  FINAL CLINICAL IMPRESSION(S) / ED DIAGNOSES  Final diagnoses:  Viral illness      NEW MEDICATIONS STARTED DURING THIS VISIT:  New Prescriptions   BROMPHENIRAMINE-PSEUDOEPHEDRINE-DM 30-2-10 MG/5ML SYRUP    Take 10 mLs by mouth 4 (four) times daily as needed.        This chart was dictated using voice recognition software/Dragon. Despite best efforts to proofread, errors can occur which can change the meaning. Any change was purely unintentional.    Racheal Patches, PA-C 03/28/17 1917    Phineas Semen, MD 03/28/17 [phone]

## 2017-03-28 NOTE — ED Triage Notes (Signed)
Pt reports flu like symptoms since Friday. Dry cough, body aches, fevers. Pt also c/o sore throat.

## 2017-06-22 NOTE — L&D Delivery Note (Signed)
Delivery Note Primary OB: [facility] Provider: Marcelyn BruinsJacelyn Taffany Heiser, CNM Gestational Age: Full term Antepartum complications: anemia Intrapartum complications: None  A viable Female was delivered via vertex presentation, OA. No nuchal cord was present. Delivery of the shoulders and body followed without difficulty. The infant was placed on the maternal abdomen. The umbilical cord was doubly clamped and cut following delayed cord clamping. Cord blood was collected. The placenta was delivered spontaneously and was inspected and found to be intact with a three vessel cord. The cervix and vagina were inspected. There were no lacerations. The fundus was firm. Patient and infant were bonding in stable condition. All counts were correct.  Apgars: 8, 9  Weight:  7 lb, 1 oz.   Placenta status: spontaneous and Intact.  Cord: 3 vessels.  Anesthesia:  epidural Episiotomy:  none Lacerations:  none Suture Repair: none Est. Blood Loss (mL):  QBL=315  Mom to postpartum.  Baby to Couplet care / Skin to Skin.  Marcelyn BruinsJacelyn Yamile Roedl, CNM [facility], [facility] 05/08/2018  3:41 AM

## 2017-09-07 ENCOUNTER — Other Ambulatory Visit: Payer: Self-pay | Admitting: Maternal Newborn

## 2017-09-07 ENCOUNTER — Ambulatory Visit (INDEPENDENT_AMBULATORY_CARE_PROVIDER_SITE_OTHER): Payer: Medicaid Other | Admitting: Maternal Newborn

## 2017-09-07 ENCOUNTER — Encounter: Payer: Self-pay | Admitting: Maternal Newborn

## 2017-09-07 ENCOUNTER — Ambulatory Visit (INDEPENDENT_AMBULATORY_CARE_PROVIDER_SITE_OTHER): Payer: Medicaid Other

## 2017-09-07 VITALS — BP 120/70 | Wt 184.0 lb

## 2017-09-07 DIAGNOSIS — Z0189 Encounter for other specified special examinations: Secondary | ICD-10-CM | POA: Diagnosis not present

## 2017-09-07 DIAGNOSIS — Z6791 Unspecified blood type, Rh negative: Secondary | ICD-10-CM

## 2017-09-07 DIAGNOSIS — Z711 Person with feared health complaint in whom no diagnosis is made: Secondary | ICD-10-CM

## 2017-09-07 DIAGNOSIS — Z124 Encounter for screening for malignant neoplasm of cervix: Secondary | ICD-10-CM

## 2017-09-07 DIAGNOSIS — O26899 Other specified pregnancy related conditions, unspecified trimester: Secondary | ICD-10-CM

## 2017-09-07 DIAGNOSIS — O09899 Supervision of other high risk pregnancies, unspecified trimester: Secondary | ICD-10-CM

## 2017-09-07 DIAGNOSIS — Z3687 Encounter for antenatal screening for uncertain dates: Secondary | ICD-10-CM

## 2017-09-07 DIAGNOSIS — Z113 Encounter for screening for infections with a predominantly sexual mode of transmission: Secondary | ICD-10-CM

## 2017-09-07 DIAGNOSIS — Z3480 Encounter for supervision of other normal pregnancy, unspecified trimester: Secondary | ICD-10-CM

## 2017-09-07 LAB — OB RESULTS CONSOLE VARICELLA ZOSTER ANTIBODY, IGG: Varicella: NON-IMMUNE/NOT IMMUNE

## 2017-09-07 NOTE — Progress Notes (Signed)
09/14/2017   Chief Complaint: Amenorrhea, positive home pregnancy test, desires prenatal care.  Transfer of Care Patient: no  History of Present Illness: Ms. Vicki Adams is a 22 y.o. G2P2002 at 5w 3d based on Patient's last menstrual period on 07/31/2017 with an Estimated Date of Delivery: 05/07/2018, with the above CC.   Her periods were: Irregular and her last two periods lasted 2-3 days. She was using no method when she conceived.  She has Positive signs or symptoms of nausea/vomiting of pregnancy. She has Negative signs or symptoms of miscarriage or preterm labor She identifies Negative Zika risk factors for her and her partner On any different medications around the time she conceived/early pregnancy: Yes, patient states that she used acid before she found out she was pregnant, has not done so since learning of pregnancy. Last use 08/14/2017. No Rx drugs. History of varicella: No   ROS: A 12-point review of systems was performed and negative, except as stated in the above HPI.  OBGYN History: As per HPI. OB History  Gravida Para Term Preterm AB Living  3 2 2  0 0 2  SAB TAB Ectopic Multiple Live Births  0 0 0 0 2    # Outcome Date GA Lbr Len/2nd Weight Sex Delivery Anes PTL Lv  3 Current           2 Term 05/12/16 [redacted]w[redacted]d / 00:08 7 lb 11.8 oz (3.51 kg) M Vag-Spont None  LIV  1 Term 06/22/15 [redacted]w[redacted]d  7 lb 0.2 oz (3.18 kg) F Vag-Spont None  LIV    Any issues with any prior pregnancies: no Any prior children are healthy, doing well, without any problems or issues: yes History of pap smears: No. History of STIs: Yes, chlamydia in 2016.   Past Medical History: Past Medical History:  Diagnosis Date  . Anemia   . 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 Syncope and collapse     Past Surgical History: Past Surgical History:  Procedure Laterality Date  . CARDIAC SURGERY  11/2000  . HERNIA REPAIR  1995-09-02   congential diaphragmatic hernia repair done at 92 weeks of age at [facility] [place]     Family History:  Family History  Problem Relation Age of Onset  . Migraines Mother   . Kidney disease Father   . Obesity Father   . Heart failure Father   . Scoliosis Sister   . Emphysema Maternal Grandmother   . Heart failure Maternal Grandmother    She denies any female cancers, bleeding or blood clotting disorders.  She has a history of a congenital cardiac defect and hiatal hernia, both repaired. Denies any other history of intellectual disability, birth defects or genetic disorders in her or the FOB's history  Social History:  Social History   Socioeconomic History  . Marital status: Single    Spouse name: Not on file  . Number of children: 2  . Years of education: 48  . Highest education level: Not on file  Occupational History  . 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 Financial resource strain: Not on file  . Food insecurity:    Worry: Not on file    Inability: Not on file  . Transportation needs:    Medical: Not on file    Non-medical: Not on file  Tobacco Use  . Smoking status: Never Smoker  . Smokeless tobacco: Never Used  Substance and Sexual Activity  . Alcohol use: No    Comment: occ  . Drug use: No    Comment: ACID  . Sexual activity: Yes    Birth control/protection: None  Lifestyle  . Physical activity:    Days per week: Not on file    Minutes per session: Not on file  . Stress: Not on file  Relationships  . Social connections:    Talks on phone: Not on file    Gets together: Not on file    Attends religious service: Not on file    Active member of club or organization: Not on file    Attends meetings of clubs or organizations: Not on file    Relationship status: Not on file  . Intimate partner violence:    Fear of current or ex partner: Not on file    Emotionally abused: Not on file    Physically abused: Not on file    Forced sexual activity: Not on file  Other  Topics Concern  . Not on file  Social History Narrative  . Not on file   Any cats in the household: no Denies history of and current domestic violence.  Allergy: Allergies  Allergen Reactions  . Erythromycin Anaphylaxis, Swelling and Nausea And Vomiting    Current Outpatient Medications:  Current Outpatient Medications:  .  acetaminophen (TYLENOL) 500 MG tablet, Take 2 tablets by mouth as needed., Disp: , Rfl:    Physical Exam:   BP 120/70   Wt 184 lb (83.5 kg)   LMP 07/31/2017   BMI 26.40 kg/m  Body mass index is 26.4 kg/m. Constitutional: Well nourished, well developed female in no acute distress.  Neck:  Supple, normal appearance, and no thyromegaly  Cardiovascular: S1, S2 normal, no murmur, rub or gallop, regular rate and rhythm Respiratory:  Clear to auscultation bilaterally. Normal respiratory effort Abdomen: no masses, hernias; diffusely non tender to palpation, non distended Breasts: breasts appear normal, no suspicious masses, no skin or nipple changes or axillary nodes. Neuro/Psych:  Normal mood and affect.  Skin:  Warm and dry.  Lymphatic:  No inguinal lymphadenopathy.   Pelvic exam: is not limited by body habitus External genitalia, Bartholin's glands, Urethra, Skene's glands: within normal limits Vagina: within normal limits and with no blood in the vault  Cervix: normal appearing cervix without discharge or lesions, closed/long/high Uterus:  nonenlarged Adnexa:  normal adnexa and no mass, fullness, tenderness  Assessment: Ms. Vicki Bogusorter is a 22 y.o. G2P2002 at 5w 3d based on Patient's last menstrual period on 07/31/2017, with an Estimated Date of Delivery: 05/07/2018, presenting for prenatal care.  Plan:  1) Avoid alcoholic beverages. 2) Patient encouraged not to smoke.  3) Discontinue the use of all non-medicinal drugs and chemicals.  4) Take prenatal vitamins daily.  5) Seatbelt use advised 6) Nutrition, food safety (fish, cheese advisories, and high  nitrite foods) and exercise discussed. 7) Hospital and practice style delivering at [facility] discussed  8) Patient is asked about travel to areas at risk for the Zika virus, and counseled to avoid travel and exposure to mosquitoes or sexual partners who may have themselves been exposed to the virus. Testing is discussed, and will be ordered as appropriate.  9) Genetic Screening, such as with 1st Trimester Screening, cell free fetal DNA, AFP testing, and Ultrasound, as well as with amniocentesis and CVS as appropriate, is discussed with patient. She plans to have genetic testing this pregnancy ([id number]). 10) Dating and viability scan done  today shows possible gestational sac. Recommended follow-up in 4 weeks. Will trend beta HCG as patient is very concerned about miscarriage.  Problem list reviewed and updated.  Marcelyn Bruins, CNM [facility], [facility]

## 2017-09-07 NOTE — Progress Notes (Signed)
C/O Did acid on a Wednesday and found out she was preg on Fri - concerned about baby, dizzy.  rj

## 2017-09-07 NOTE — Patient Instructions (Signed)
First Trimester of Pregnancy The first trimester of pregnancy is from week 1 until the end of week 13 (months 1 through 3). A week after a sperm fertilizes an egg, the egg will implant on the wall of the uterus. This embryo will begin to develop into a baby. Genes from you and your partner will form the baby. The female genes will determine whether the baby will be a boy or a girl. At 6-8 weeks, the eyes and face will be formed, and the heartbeat can be seen on ultrasound. At the end of 12 weeks, all the baby's organs will be formed. Now that you are pregnant, you will want to do everything you can to have a healthy baby. Two of the most important things are to get good prenatal care and to follow your health care provider's instructions. Prenatal care is all the medical care you receive before the baby's birth. This care will help prevent, find, and treat any problems during the pregnancy and childbirth. Body changes during your first trimester Your body goes through many changes during pregnancy. The changes vary from woman to woman.  You may gain or lose a couple of pounds at first.  You may feel sick to your stomach (nauseous) and you may throw up (vomit). If the vomiting is uncontrollable, call your health care provider.  You may tire easily.  You may develop headaches that can be relieved by medicines. All medicines should be approved by your health care provider.  You may urinate more often. Painful urination may mean you have a bladder infection.  You may develop heartburn as a result of your pregnancy.  You may develop constipation because certain hormones are causing the muscles that push stool through your intestines to slow down.  You may develop hemorrhoids or swollen veins (varicose veins).  Your breasts may begin to grow larger and become tender. Your nipples may stick out more, and the tissue that surrounds them (areola) may become darker.  Your gums may bleed and may be  sensitive to brushing and flossing.  Dark spots or blotches (chloasma, mask of pregnancy) may develop on your face. This will likely fade after the baby is born.  Your menstrual periods will stop.  You may have a loss of appetite.  You may develop cravings for certain kinds of food.  You may have changes in your emotions from day to day, such as being excited to be pregnant or being concerned that something may go wrong with the pregnancy and baby.  You may have more vivid and strange dreams.  You may have changes in your hair. These can include thickening of your hair, rapid growth, and changes in texture. Some women also have hair loss during or after pregnancy, or hair that feels dry or thin. Your hair will most likely return to normal after your baby is born.  What to expect at prenatal visits During a routine prenatal visit:  You will be weighed to make sure you and the baby are growing normally.  Your blood pressure will be taken.  Your abdomen will be measured to track your baby's growth.  The fetal heartbeat will be listened to between weeks 10 and 14 of your pregnancy.  Test results from any previous visits will be discussed.  Your health care provider may ask you:  How you are feeling.  If you are feeling the baby move.  If you have had any abnormal symptoms, such as leaking fluid, bleeding, severe headaches,   or abdominal cramping.  If you are using any tobacco products, including cigarettes, chewing tobacco, and electronic cigarettes.  If you have any questions.  Other tests that may be performed during your first trimester include:  Blood tests to find your blood type and to check for the presence of any previous infections. The tests will also be used to check for low iron levels (anemia) and protein on red blood cells (Rh antibodies). Depending on your risk factors, or if you previously had diabetes during pregnancy, you may have tests to check for high blood  sugar that affects pregnant women (gestational diabetes).  Urine tests to check for infections, diabetes, or protein in the urine.  An ultrasound to confirm the proper growth and development of the baby.  Fetal screens for spinal cord problems (spina bifida) and Down syndrome.  HIV (human immunodeficiency virus) testing. Routine prenatal testing includes screening for HIV, unless you choose not to have this test.  You may need other tests to make sure you and the baby are doing well.  Follow these instructions at home: Medicines  Follow your health care provider's instructions regarding medicine use. Specific medicines may be either safe or unsafe to take during pregnancy.  Take a prenatal vitamin that contains at least 600 micrograms (mcg) of folic acid.  If you develop constipation, try taking a stool softener if your health care provider approves. Eating and drinking  Eat a balanced diet that includes fresh fruits and vegetables, whole grains, good sources of protein such as meat, eggs, or tofu, and low-fat dairy. Your health care provider will help you determine the amount of weight gain that is right for you.  Avoid raw meat and uncooked cheese. These carry germs that can cause birth defects in the baby.  Eating four or five small meals rather than three large meals a day may help relieve nausea and vomiting. If you start to feel nauseous, eating a few soda crackers can be helpful. Drinking liquids between meals, instead of during meals, also seems to help ease nausea and vomiting.  Limit foods that are high in fat and processed sugars, such as fried and sweet foods.  To prevent constipation: ? Eat foods that are high in fiber, such as fresh fruits and vegetables, whole grains, and beans. ? Drink enough fluid to keep your urine clear or pale yellow. Activity  Exercise only as directed by your health care provider. Most women can continue their usual exercise routine during  pregnancy. Try to exercise for 30 minutes at least 5 days a week. Exercising will help you: ? Control your weight. ? Stay in shape. ? Be prepared for labor and delivery.  Experiencing pain or cramping in the lower abdomen or lower back is a good sign that you should stop exercising. Check with your health care provider before continuing with normal exercises.  Try to avoid standing for long periods of time. Move your legs often if you must stand in one place for a long time.  Avoid heavy lifting.  Wear low-heeled shoes and practice good posture.  You may continue to have sex unless your health care provider tells you not to. Relieving pain and discomfort  Wear a good support bra to relieve breast tenderness.  Take warm sitz baths to soothe any pain or discomfort caused by hemorrhoids. Use hemorrhoid cream if your health care provider approves.  Rest with your legs elevated if you have leg cramps or low back pain.  If you develop   varicose veins in your legs, wear support hose. Elevate your feet for 15 minutes, 3-4 times a day. Limit salt in your diet. Prenatal care  Schedule your prenatal visits by the twelfth week of pregnancy. They are usually scheduled monthly at first, then more often in the last 2 months before delivery.  Write down your questions. Take them to your prenatal visits.  Keep all your prenatal visits as told by your health care provider. This is important. Safety  Wear your seat belt at all times when driving.  Make a list of emergency phone numbers, including numbers for family, friends, the hospital, and police and fire departments. General instructions  Ask your health care provider for a referral to a local prenatal education class. Begin classes no later than the beginning of month 6 of your pregnancy.  Ask for help if you have counseling or nutritional needs during pregnancy. Your health care provider can offer advice or refer you to specialists for help  with various needs.  Do not use hot tubs, steam rooms, or saunas.  Do not douche or use tampons or scented sanitary pads.  Do not cross your legs for long periods of time.  Avoid cat litter boxes and soil used by cats. These carry germs that can cause birth defects in the baby and possibly loss of the fetus by miscarriage or stillbirth.  Avoid all smoking, herbs, alcohol, and medicines not prescribed by your health care provider. Chemicals in these products affect the formation and growth of the baby.  Do not use any products that contain nicotine or tobacco, such as cigarettes and e-cigarettes. If you need help quitting, ask your health care provider. You may receive counseling support and other resources to help you quit.  Schedule a dentist appointment. At home, brush your teeth with a soft toothbrush and be gentle when you floss. Contact a health care provider if:  You have dizziness.  You have mild pelvic cramps, pelvic pressure, or nagging pain in the abdominal area.  You have persistent nausea, vomiting, or diarrhea.  You have a bad smelling vaginal discharge.  You have pain when you urinate.  You notice increased swelling in your face, hands, legs, or ankles.  You are exposed to fifth disease or chickenpox.  You are exposed to German measles (rubella) and have never had it. Get help right away if:  You have a fever.  You are leaking fluid from your vagina.  You have spotting or bleeding from your vagina.  You have severe abdominal cramping or pain.  You have rapid weight gain or loss.  You vomit blood or material that looks like coffee grounds.  You develop a severe headache.  You have shortness of breath.  You have any kind of trauma, such as from a fall or a car accident. Summary  The first trimester of pregnancy is from week 1 until the end of week 13 (months 1 through 3).  Your body goes through many changes during pregnancy. The changes vary from  woman to woman.  You will have routine prenatal visits. During those visits, your health care provider will examine you, discuss any test results you may have, and talk with you about how you are feeling. This information is not intended to replace advice given to you by your health care provider. Make sure you discuss any questions you have with your health care provider. Document Released: 06/02/2001 Document Revised: 05/20/2016 Document Reviewed: 05/20/2016 Elsevier Interactive Patient Education  2018 Elsevier   Inc.  

## 2017-09-08 LAB — RPR+RH+ABO+RUB AB+AB SCR+CB...
ANTIBODY SCREEN: NEGATIVE
HIV Screen 4th Generation wRfx: NONREACTIVE
Hematocrit: 34 % (ref 34.0–46.6)
Hemoglobin: 11.3 g/dL (ref 11.1–15.9)
Hepatitis B Surface Ag: NEGATIVE
MCH: 25.3 pg — AB (ref 26.6–33.0)
MCHC: 33.2 g/dL (ref 31.5–35.7)
MCV: 76 fL — AB (ref 79–97)
Platelets: 223 10*3/uL (ref 150–379)
RBC: 4.46 x10E6/uL (ref 3.77–5.28)
RDW: 16 % — ABNORMAL HIGH (ref 12.3–15.4)
RPR Ser Ql: NONREACTIVE
RUBELLA: 1.65 {index} (ref >0.99)
Rh Factor: NEGATIVE
WBC: 8.6 10*3/uL (ref 3.4–10.8)

## 2017-09-08 LAB — URINE DRUG PANEL 7
Amphetamines, Urine: NEGATIVE ng/mL
BARBITURATE QUANT UR: NEGATIVE ng/mL
Benzodiazepine Quant, Ur: NEGATIVE ng/mL
Cannabinoid Quant, Ur: NEGATIVE ng/mL
Cocaine (Metab.): NEGATIVE ng/mL
Opiate Quant, Ur: NEGATIVE ng/mL
PCP QUANT UR: NEGATIVE ng/mL

## 2017-09-09 ENCOUNTER — Telehealth: Payer: Self-pay | Admitting: Maternal Newborn

## 2017-09-09 ENCOUNTER — Other Ambulatory Visit: Payer: Self-pay | Admitting: Maternal Newborn

## 2017-09-09 DIAGNOSIS — Z711 Person with feared health complaint in whom no diagnosis is made: Secondary | ICD-10-CM

## 2017-09-09 LAB — BETA HCG QUANT (REF LAB): HCG QUANT: 262 m[IU]/mL

## 2017-09-09 LAB — URINE CULTURE: ORGANISM ID, BACTERIA: NO GROWTH

## 2017-09-09 LAB — SPECIMEN STATUS REPORT

## 2017-09-09 NOTE — Telephone Encounter (Signed)
Patient is calling to follow up on lab result

## 2017-09-09 NOTE — Telephone Encounter (Signed)
Spoke with patient about lab results received per verbal report, scheduled follow-up lab for tomorrow.

## 2017-09-09 NOTE — Telephone Encounter (Signed)
Patient is calling for follow up on Labs results. I spoke with Lab tech Vicki Adams to find out if the results when in and they are still pending. I reassured patient Vicki Adams would call as soon as the labs come in .

## 2017-09-10 ENCOUNTER — Other Ambulatory Visit: Payer: Medicaid Other

## 2017-09-10 ENCOUNTER — Encounter: Payer: Self-pay | Admitting: Maternal Newborn

## 2017-09-10 DIAGNOSIS — Z711 Person with feared health complaint in whom no diagnosis is made: Secondary | ICD-10-CM

## 2017-09-11 LAB — PAP LB, CT-NG TV RFX HPV ASCU
CHLAMYDIA, NUC. ACID AMP: NEGATIVE
GONOCOCCUS, NUC. ACID AMP: NEGATIVE
PAP Smear Comment: 0
TRICH VAG BY NAA: NEGATIVE

## 2017-09-11 LAB — BETA HCG QUANT (REF LAB): hCG Quant: 849 m[IU]/mL

## 2017-09-14 ENCOUNTER — Encounter: Payer: Self-pay | Admitting: Maternal Newborn

## 2017-09-14 ENCOUNTER — Other Ambulatory Visit: Payer: Self-pay | Admitting: Maternal Newborn

## 2017-09-14 DIAGNOSIS — Z711 Person with feared health complaint in whom no diagnosis is made: Secondary | ICD-10-CM

## 2017-09-14 DIAGNOSIS — Z3480 Encounter for supervision of other normal pregnancy, unspecified trimester: Secondary | ICD-10-CM

## 2017-09-21 ENCOUNTER — Ambulatory Visit (INDEPENDENT_AMBULATORY_CARE_PROVIDER_SITE_OTHER): Payer: Medicaid Other

## 2017-09-21 ENCOUNTER — Ambulatory Visit (INDEPENDENT_AMBULATORY_CARE_PROVIDER_SITE_OTHER): Payer: Medicaid Other | Admitting: Obstetrics and Gynecology

## 2017-09-21 VITALS — BP 110/66 | Wt 190.0 lb

## 2017-09-21 DIAGNOSIS — O26899 Other specified pregnancy related conditions, unspecified trimester: Secondary | ICD-10-CM

## 2017-09-21 DIAGNOSIS — Z3A01 Less than 8 weeks gestation of pregnancy: Secondary | ICD-10-CM

## 2017-09-21 DIAGNOSIS — Z3480 Encounter for supervision of other normal pregnancy, unspecified trimester: Secondary | ICD-10-CM

## 2017-09-21 DIAGNOSIS — Z711 Person with feared health complaint in whom no diagnosis is made: Secondary | ICD-10-CM

## 2017-09-21 DIAGNOSIS — O99419 Diseases of the circulatory system complicating pregnancy, unspecified trimester: Secondary | ICD-10-CM

## 2017-09-21 DIAGNOSIS — Q249 Congenital malformation of heart, unspecified: Secondary | ICD-10-CM

## 2017-09-21 DIAGNOSIS — Z6791 Unspecified blood type, Rh negative: Secondary | ICD-10-CM

## 2017-09-21 NOTE — Progress Notes (Signed)
Routine Prenatal Care Visit  Subjective  Vicki Adams is a 22 y.o. G3P2002 at 2474w3d being seen today for ongoing prenatal care.  She is currently monitorMolli Adams for the following issues for this high-risk pregnancy and has History of percutaneous transcatheter closure of congenital ASD; History of esophageal hernia repair; Congenital heart disease, maternal, antepartum; Rh negative state in antepartum period; and Supervision of other normal pregnancy, antepartum on their problem list.  ----------------------------------------------------------------------------------- Patient reports no complaints.    . Vag. Bleeding: None.  Movement: Absent. Denies leaking of fluid.  ----------------------------------------------------------------------------------- The following portions of the patient's history were reviewed and updated as appropriate: allergies, current medications, past family history, past medical history, past social history, past surgical history and problem list. Problem list updated.   Objective  Blood pressure 110/66, weight 190 lb (86.2 kg), last menstrual period 07/31/2017, unknown if currently breastfeeding. Pregravid weight 180 lb (81.6 kg) Total Weight Gain 10 lb (4.536 kg) Urinalysis: Urine Protein: Negative Urine Glucose: Negative  Fetal Status:     Movement: Absent     General:  Alert, oriented and cooperative. Patient is in no acute distress.  Skin: Skin is warm and dry. No rash noted.   Cardiovascular: Normal heart rate noted  Respiratory: Normal respiratory effort, no problems with respiration noted  Abdomen: Soft, gravid, appropriate for gestational age.       Pelvic:  Cervical exam deferred        Extremities: Normal range of motion.     ental Status: Normal mood and affect. Normal behavior. Normal judgment and thought content.   Vicki Adams Ob Comp Less 14 Wks  Result Date: 09/21/2017 ULTRASOUND REPORT Location: [facility] Date of Service: 09/21/2017  Indications:dating Findings: Vicki Adams intrauterine pregnancy is visualized with a CRL consistent with 11074w3d gestation, giving an (U/S) EDD of 05/14/2018. The (U/S) EDD is not consistent with the clinically established EDD of 05/07/2018. FHR: 120 bpm CRL measurement: 6.4 mm Yolk sac is not visualized and early anatomy is normal. Amnion: not visualized Right Ovary is normal in appearance. Left Ovary is normal appearance. Corpus luteal cyst:  is not visualized Survey of the adnexa demonstrates no adnexal masses. There is no free peritoneal fluid in the cul de sac. Impression: 1. 8174w3d Viable Singleton Intrauterine pregnancy by U/S. 2. (U/S) EDD is not consistent with Clinically established EDD of 05/07/2018 and is no 05/14/2018 based on today's ultrasound. Recommendations: 1.Clinical correlation with the patient's History and Physical Exam. [name], RDMS, RVT Based on ultrasound examination today, EDC changed to 05/14/2018 Vicki Adams Criteria for Re-dating Pregnancy Dating Dating Discrepancy 6031w6d >5 days 1898w0d - 4054w6d >7 days 4141w0d - 6043w6d >7 days 3781w0d - 8666w6d >10 days 6629w0d - 8092w6d >14 days 4857w0d and above >21 days ACOG Committee Opinion 700 May 2017 "Methods for Estimating the Due Date" There is a viable singleton gestation.  The fetal biometry does not correlate with established dating. Detailed evaluation of the fetal anatomy is precluded by early gestational age.  It must be noted that a normal ultrasound particular at this early gestational age is unable to rule out fetal aneuploidy, risk of first trimester miscarriage, or anatomic birth defects. Vicki AustriaAndreas Serjio Deupree, MD, Vicki FrederickFACOG [facility], [facility] 09/21/2017, 2:55 PM   Vicki Adams Ob Transvaginal  Result Date: 09/09/2017 ULTRASOUND REPORT Location: [facility] Date of Service: 09/07/2017 Indications:Unsure LMP, DATING Findings: Yolk sac and fetal pole is not seen. Small cystic area in EM cavity possible gestational sac. Right Ovary is normal in  appearance. Left Ovary is normal appearance. Corpus luteal cyst:  Right ovary Survey of the adnexa demonstrates no adnexal masses. There is no free peritoneal fluid in the cul de sac. Impression: 1. Small cystic area in EM cavity possible gestational sac. 2. Possible early stage of pregnancy. Recommendations: 1.Clinical correlation with the patient's History and Physical Exam. 2. Need to follow up for viability in 4-5 weeks Vicki Adams, RDMS I have reviewed this ultrasound and the report. I agree with the above assessment and plan. Vicki Idler MD [facility] 09/09/17 6:29 PM     Assessment   22 y.o. [id number] at [redacted]w[redacted]d by  05/14/2018, by Ultrasound presenting for routine prenatal visit  Plan   THIRD Problems (from 09/07/17 to present)    Problem Noted Resolved   Rh negative state in antepartum period 09/07/2017 by Vicki Adams, CNM No   Overview Signed 09/21/2017  4:27 PM by Vicki Austria, MD    [ ]  rhogam 28 weeks      Supervision of other normal pregnancy, antepartum 09/07/2017 by Vicki Adams, CNM No   Overview Addendum 09/21/2017  4:30 PM by Vicki Austria, MD    Clinic [facility] Prenatal Labs  Dating 6 week [place] Blood type: O/Negative/-- (03/19 1511)   Genetic Screen NIPS:    AFP:      Carrier testing Fragile X, SMA, CF: Antibody:Negative (03/19 1511)  Anatomic [place]  Rubella: 1.65 (03/19 1511) Varicella: Non-immune  GTT Early:               Third trimester:  RPR: Non Reactive (03/19 1511)   Rhogam [ ]  28 weeks HBsAg: Negative (03/19 1511)   TDaP vaccine                       HIV: Non Reactive (03/19 1511)   Baby Food                                GBS:   Contraception  Pap: 09/07/2017 NILM  CBB   Maternal history of ASD [ ]  fetal echo at 20-24 weeks  CS/VBAC    Support Person              Congenital heart disease, maternal, antepartum 01/23/2016 by Vicki, [place], MD No   Overview Signed 09/21/2017  4:26 PM by Vicki Austria, MD     [ ]  Fetal Echo at 20-[redacted] weeks          Gestational age appropriate obstetric precautions including but not limited to vaginal bleeding, contractions, leaking of fluid and fetal movement were reviewed in detail with the patient.   - viable IUP noted on ultrasound today, EDC adjusted - NIPT and carrier testing next visit  Return in about 1 month (around 10/19/2017) for ROB.  Vicki Austria, MD, Evern Core [facility], [facility] 09/21/2017, 4:31 PM

## 2017-09-21 NOTE — Progress Notes (Signed)
ROB Dating scan 

## 2017-09-23 ENCOUNTER — Other Ambulatory Visit: Payer: Self-pay | Admitting: Advanced Practice Midwife

## 2017-09-23 ENCOUNTER — Encounter: Payer: Self-pay | Admitting: Obstetrics and Gynecology

## 2017-09-23 DIAGNOSIS — Z3480 Encounter for supervision of other normal pregnancy, unspecified trimester: Secondary | ICD-10-CM

## 2017-09-23 MED ORDER — CITRANATAL DHA 27-1 & 250 MG PO MISC: 1.0000 | Freq: Every day | ORAL | 11 refills | Status: DC

## 2017-09-23 NOTE — Progress Notes (Signed)
Rx PNV sent.

## 2017-09-25 ENCOUNTER — Encounter: Payer: Self-pay | Admitting: Obstetrics and Gynecology

## 2017-09-26 ENCOUNTER — Encounter: Payer: Self-pay | Admitting: Medical Oncology

## 2017-09-26 ENCOUNTER — Emergency Department
Admission: EM | Admit: 2017-09-26 | Discharge: 2017-09-26 | Disposition: A | Discharge status: Home / Self Care | Payer: Medicaid Other | Attending: Emergency Medicine | Admitting: Emergency Medicine

## 2017-09-26 ENCOUNTER — Encounter: Payer: Self-pay | Admitting: Maternal Newborn

## 2017-09-26 DIAGNOSIS — O9989 Other specified diseases and conditions complicating pregnancy, childbirth and the puerperium: Secondary | ICD-10-CM | POA: Insufficient documentation

## 2017-09-26 DIAGNOSIS — O219 Vomiting of pregnancy, unspecified: Secondary | ICD-10-CM | POA: Insufficient documentation

## 2017-09-26 DIAGNOSIS — Z3A00 Weeks of gestation of pregnancy not specified: Secondary | ICD-10-CM | POA: Insufficient documentation

## 2017-09-26 DIAGNOSIS — R51 Headache: Secondary | ICD-10-CM | POA: Insufficient documentation

## 2017-09-26 DIAGNOSIS — R112 Nausea with vomiting, unspecified: Secondary | ICD-10-CM

## 2017-09-26 DIAGNOSIS — R519 Headache, unspecified: Secondary | ICD-10-CM

## 2017-09-26 LAB — URINALYSIS, COMPLETE (UACMP) WITH MICROSCOPIC
Bilirubin Urine: NEGATIVE
GLUCOSE, UA: NEGATIVE mg/dL
HGB URINE DIPSTICK: NEGATIVE
Ketones, ur: NEGATIVE mg/dL
Nitrite: NEGATIVE
PH: 6 (ref 5.0–8.0)
Protein, ur: NEGATIVE mg/dL
SPECIFIC GRAVITY, URINE: 1.014 (ref 1.005–1.030)

## 2017-09-26 LAB — COMPREHENSIVE METABOLIC PANEL
ALK PHOS: 48 U/L (ref 38–126)
ALT: 16 U/L (ref 14–54)
ANION GAP: 5 (ref 5–15)
AST: 18 U/L (ref 15–41)
Albumin: 3.9 g/dL (ref 3.5–5.0)
BILIRUBIN TOTAL: 0.5 mg/dL (ref 0.3–1.2)
BUN: 7 mg/dL (ref 6–20)
CALCIUM: 9 mg/dL (ref 8.9–10.3)
CO2: 27 mmol/L (ref 22–32)
Chloride: 105 mmol/L (ref 101–111)
Creatinine, Ser: 0.71 mg/dL (ref 0.44–1.00)
GFR calc Af Amer: >60 mL/min (ref >60)
GLUCOSE: 101 mg/dL — AB (ref 65–99)
Potassium: 3.4 mmol/L — ABNORMAL LOW (ref 3.5–5.1)
Sodium: 137 mmol/L (ref 135–145)
TOTAL PROTEIN: 7.3 g/dL (ref 6.5–8.1)

## 2017-09-26 LAB — CBC
HCT: 35.4 % (ref 35.0–47.0)
Hemoglobin: 11.7 g/dL — ABNORMAL LOW (ref 12.0–16.0)
MCH: 25.5 pg — ABNORMAL LOW (ref 26.0–34.0)
MCHC: 33 g/dL (ref 32.0–36.0)
MCV: 77.2 fL — ABNORMAL LOW (ref 80.0–100.0)
Platelets: 169 10*3/uL (ref 150–440)
RBC: 4.58 MIL/uL (ref 3.80–5.20)
RDW: 18 % — AB (ref 11.5–14.5)
WBC: 7.4 10*3/uL (ref 3.6–11.0)

## 2017-09-26 LAB — LIPASE, BLOOD: Lipase: 29 U/L (ref 11–51)

## 2017-09-26 MED ORDER — SODIUM CHLORIDE 0.9 % IV BOLUS
1000.0000 mL | Freq: Once | INTRAVENOUS | Status: AC
  Administered 2017-09-26: 1000 mL via INTRAVENOUS

## 2017-09-26 MED ORDER — METOCLOPRAMIDE HCL 5 MG/ML IJ SOLN
10.0000 mg | Freq: Once | INTRAMUSCULAR | Status: AC
  Administered 2017-09-26: 10 mg via INTRAVENOUS

## 2017-09-26 MED ORDER — METOCLOPRAMIDE HCL 5 MG/ML IJ SOLN
INTRAMUSCULAR | Status: AC
  Administered 2017-09-26: 10 mg via INTRAVENOUS
  Filled 2017-09-26: qty 2

## 2017-09-26 MED ORDER — BUTALBITAL-APAP-CAFFEINE 50-325-40 MG PO TABS
ORAL_TABLET | ORAL | Status: AC
  Filled 2017-09-26: qty 1

## 2017-09-26 MED ORDER — BUTALBITAL-APAP-CAFFEINE 50-325-40 MG PO TABS
1.0000 | ORAL_TABLET | Freq: Once | ORAL | Status: AC
  Administered 2017-09-26: 1 via ORAL

## 2017-09-26 NOTE — ED Triage Notes (Signed)
Pt reports that she is 7 weeks preg and has been having headache and vomiting x 2 days. Pt denies fever, denies diarrhea. EDD 05/14/18. G3 P2.

## 2017-09-26 NOTE — ED Provider Notes (Signed)
[facility] Emergency Department Provider Note   ____________________________________________   I have reviewed the triage vital signs and the nursing notes.   HISTORY  Chief Complaint Headache  History limited by: Not Limited   HPI Vicki Adams is a 22 y.o. female, in first trimester of pregnancy, who presents to the emergency department today because of concern for headache. Has been present for the past two days.  Located primarily on the left side.  Patient states it is severe.  It is constant.  She does have photophobia and nausea and vomiting.  She has a history of migraines but states they tend to not last this long.  She did try Tylenol without any significant relief.  She denies any recent trauma, denies any fevers.    Per medical record review patient has a history of ER visit for headache in the past.   Past Medical History:  Diagnosis Date  . Anemia   . Secundum ASD    [facility]  . Syncope and collapse     Patient Active Problem List   Diagnosis Date Noted  . Rh negative state in antepartum period 09/07/2017  . Supervision of other normal pregnancy, antepartum 09/07/2017  . Congenital heart disease, maternal, antepartum 01/23/2016  . History of percutaneous transcatheter closure of congenital ASD 01/24/2015  . History of esophageal hernia repair 01/24/2015    Past Surgical History:  Procedure Laterality Date  . CARDIAC SURGERY  11/2000  . HERNIA REPAIR  06/1995   congential diaphragmatic hernia repair [facility] at 692 [facility] age [facility]    Prior to Admission medications   Medication Sig Start Date End Date Taking? Authorizing Provider  acetaminophen (TYLENOL) 500 MG tablet Take 2 tablets by mouth as needed.    [provider]  Prenat w/o A-[id number]-[id number]-FA-DHA (CITRANATAL DHA) 27-1 & 250 MG tablet Take 1 tablet by mouth daily. 09/23/17   Tresea MallGledhill, Jane, CNM    Allergies Erythromycin  Family History  Problem  Relation Age of Onset  . Migraines Mother   . Kidney disease Father   . Obesity Father   . Heart failure Father   . Scoliosis Sister   . Emphysema Maternal Grandmother   . Heart failure Maternal Grandmother     Social History Social History   Tobacco Use  . Smoking status: Never Smoker  . Smokeless tobacco: Never Used  Substance Use Topics  . Alcohol use: No    Comment: occ  . Drug use: No    Comment: ACID    Review of Systems Constitutional: No fever/chills Eyes: No visual changes. ENT: No sore throat. Cardiovascular: Denies chest pain. Respiratory: Denies shortness of breath. Gastrointestinal: No abdominal pain.  Positive for nausea and vomiting. Genitourinary: Negative for dysuria. Musculoskeletal: Negative for back pain. Skin: Negative for rash. Neurological: Positive for headache. ____________________________________________   PHYSICAL EXAM:  VITAL SIGNS: ED Triage Vitals  Enc Vitals Group     BP 09/26/17 1606 (!) 119/53     Pulse Rate 09/26/17 1606 69     Resp 09/26/17 1606 18     Temp 09/26/17 1606 98.5 F (36.9 C)     Temp Source 09/26/17 1606 Oral     SpO2 09/26/17 1606 100 %     Weight 09/26/17 1620 186 lb (84.4 kg)     Height 09/26/17 1620 5\' 7"  (1.702 m)     Head Circumference --      Peak Flow --  Pain Score 09/26/17 1619 4   Constitutional: Alert and oriented.  Eyes: Conjunctivae are normal.  ENT   Head: Normocephalic and atraumatic.   Nose: No congestion/rhinnorhea.   Mouth/Throat: Mucous membranes are moist.   Neck: No stridor. Hematological/Lymphatic/Immunilogical: No cervical lymphadenopathy. Cardiovascular: Normal rate, regular rhythm.  No murmurs, rubs, or gallops.  Respiratory: Normal respiratory effort without tachypnea nor retractions. Breath sounds are clear and equal bilaterally. No wheezes/rales/rhonchi. Gastrointestinal: Soft and non tender. No rebound. No guarding.  Genitourinary: Deferred Musculoskeletal:  Normal range of motion in all extremities. No lower extremity edema. Neurologic:  Normal speech and language. No gross focal neurologic deficits are appreciated.  Skin:  Skin is warm, dry and intact. No rash noted. Psychiatric: Mood and affect are normal. Speech and behavior are normal. Patient exhibits appropriate insight and judgment.  ____________________________________________    LABS (pertinent positives/negatives)  Lipase 29 CMP na 137, k 3.4, glu 101 CBC wbc 7.4, hgb 11.7, plt 169 UA cloudy, moderate leukocytes, rbc and wbc 6-30, rare bacteria 6-30 squamous  ____________________________________________   EKG  None  ____________________________________________    RADIOLOGY  None  ____________________________________________   PROCEDURES  Procedures  ____________________________________________   INITIAL IMPRESSION / ASSESSMENT AND PLAN / ED COURSE  Pertinent labs & imaging results that were available during my care of the patient were reviewed by me and considered in my medical decision making (see chart for details).  Patient presented to the emergency department today because of concerns for headache, nausea and vomiting.  She is in the first trimester for pregnancy.  Blood work showed just very mild hypokalemia.  Patient was given IV fluids and Reglan and did feel better.  She declined prescription stating that her primary OB/GYN doctor has ordered something for her.  She felt comfortable going home at time of discharge.   ____________________________________________   FINAL CLINICAL IMPRESSION(S) / ED DIAGNOSES  Final diagnoses:  Nausea and vomiting, intractability of vomiting not specified, unspecified vomiting type  Nonintractable headache, unspecified chronicity pattern, unspecified headache type     Note: This dictation was prepared with Dragon dictation. Any transcriptional errors that result from this process are unintentional      Phineas Semen, MD 09/26/17 [id number]

## 2017-09-26 NOTE — Discharge Instructions (Addendum)
Please seek medical attention for any high fevers, chest pain, shortness of breath, change in behavior, persistent vomiting, bloody stool or any other new or concerning symptoms.  

## 2017-10-19 ENCOUNTER — Encounter: Payer: Self-pay | Admitting: Maternal Newborn

## 2017-10-19 ENCOUNTER — Ambulatory Visit (INDEPENDENT_AMBULATORY_CARE_PROVIDER_SITE_OTHER): Payer: Medicaid Other | Admitting: Maternal Newborn

## 2017-10-19 VITALS — BP 120/70 | Wt 187.0 lb

## 2017-10-19 DIAGNOSIS — G43709 Chronic migraine without aura, not intractable, without status migrainosus: Secondary | ICD-10-CM

## 2017-10-19 DIAGNOSIS — Z3A10 10 weeks gestation of pregnancy: Secondary | ICD-10-CM

## 2017-10-19 DIAGNOSIS — Z3480 Encounter for supervision of other normal pregnancy, unspecified trimester: Secondary | ICD-10-CM

## 2017-10-19 DIAGNOSIS — Z368A Encounter for antenatal screening for other genetic defects: Secondary | ICD-10-CM

## 2017-10-19 DIAGNOSIS — O9989 Other specified diseases and conditions complicating pregnancy, childbirth and the puerperium: Secondary | ICD-10-CM

## 2017-10-19 DIAGNOSIS — Z8659 Personal history of other mental and behavioral disorders: Secondary | ICD-10-CM

## 2017-10-19 DIAGNOSIS — O99891 Other specified diseases and conditions complicating pregnancy: Secondary | ICD-10-CM

## 2017-10-19 DIAGNOSIS — Z1379 Encounter for other screening for genetic and chromosomal anomalies: Secondary | ICD-10-CM

## 2017-10-19 MED ORDER — BUTALBITAL-APAP-CAFFEINE 50-325-40 MG PO CAPS: 1.0000 | ORAL_CAPSULE | Freq: Four times a day (QID) | ORAL | 3 refills | Status: DC | PRN

## 2017-10-19 NOTE — Progress Notes (Signed)
Routine Prenatal Care Visit  Subjective  Vicki Adams is a 22 y.o. G3P2002 at [redacted]w[redacted]d being seen today for ongoing prenatal care.  She is currently monitored for the following issues for this low-risk pregnancy and has History of percutaneous transcatheter closure of congenital ASD; History of esophageal hernia repair; Congenital heart disease, maternal, antepartum; Rh negative state in antepartum period; Supervision of other normal pregnancy, antepartum; and History of postpartum depression, currently pregnant on their problem list.  ----------------------------------------------------------------------------------- Patient reports seasonal allergy symptoms, nausea/vomiting, occasional cramps, and migraine headaches. She was seen in the ED on 4/7 for headache.   Vag. Bleeding: None. No leaking of fluid.  ----------------------------------------------------------------------------------- The following portions of the patient's history were reviewed and updated as appropriate: allergies, current medications, past family history, past medical history, past social history, past surgical history and problem list. Problem list updated.   Objective  Blood pressure 120/70, weight 187 lb (84.8 kg), last menstrual period 07/31/2017. Pregravid weight 180 lb (81.6 kg) Total Weight Gain 7 lb (3.175 kg) Urinalysis: Urine Protein: Negative Urine Glucose: Negative   General:  Alert, oriented and cooperative. Patient is in no acute distress.  Skin: Skin is warm and dry. No rash noted.   Cardiovascular: Normal heart rate noted  Respiratory: Normal respiratory effort, no problems with respiration noted  Abdomen: Soft, gravid, appropriate for gestational age.       Pelvic:  Cervical exam deferred        Extremities: Normal range of motion.     Mental Status: Normal mood and affect. Normal behavior. Normal judgment and thought content.     Assessment   22 y.o. [id number] at [redacted]w[redacted]d, EDD 05/14/2018  by Ultrasound presenting for routine prenatal visit.  Plan   THIRD Problems (from 09/07/17 to present)    Problem Noted Resolved   Rh negative state in antepartum period 09/07/2017 by Oswaldo Conroy, CNM No   Overview Signed 09/21/2017  4:27 PM by Vena Austria, MD     rhogam 28 weeks      Supervision of other normal pregnancy, antepartum 09/07/2017 by Oswaldo Conroy, CNM No   Overview Addendum 09/21/2017  4:30 PM by Vena Austria, MD    [facility]inic [facility] Prenatal Labs  Dating 6 week [place] Blood type: O/Negative/-- (03/19 1511)   Genetic Screen NIPS:    AFP:      Carrier testing Fragile X, SMA, CF: Antibody:Negative (03/19 1511)  Anatomic [place]  Rubella: 1.65 (03/19 1511) Varicella: Non-immune  GTT Early:               Third trimester:  RPR: Non Reactive (03/19 1511)   Rhogam  28 weeks HBsAg: Negative (03/19 1511)   TDaP vaccine                       HIV: Non Reactive (03/19 1511)   Baby Food                                GBS:   Contraception  Pap: 09/07/2017 NILM  CBB   Maternal history of ASD  fetal echo at 20-24 weeks  CS/VBAC    Support Person              Congenital heart disease, maternal, antepartum 01/23/2016 by Grotegut, [place], MD No   Overview Signed 09/21/2017  4:26 PM by Vena Austria, MD      Fetal Echo at 20-24 weeks       Sample of Bonjesta given today for nausea. Advised Claritin or similar OTC medication to help with allergies. Rx given for Fioricet to see if she can get some relief from migraines.  MaterniTi 21 and Inheritest done today.  Return in about 1 month (around 11/16/2017) for ROB.  Marcelyn Bruins, CNM 10/19/2017  12:01 PM

## 2017-10-19 NOTE — Progress Notes (Signed)
C/o vomiting; cramping.rj

## 2017-10-22 ENCOUNTER — Encounter: Payer: Self-pay | Admitting: Maternal Newborn

## 2017-10-26 ENCOUNTER — Telehealth: Payer: Self-pay | Admitting: Maternal Newborn

## 2017-10-26 NOTE — Telephone Encounter (Signed)
Patient is calling for labs results. Please advise. 

## 2017-10-27 NOTE — Telephone Encounter (Signed)
Patient calling again to speak with Jaci, same cb

## 2017-10-27 NOTE — Telephone Encounter (Signed)
Patient is calling to speak with JC. Please call patient

## 2017-10-28 ENCOUNTER — Encounter: Payer: Self-pay | Admitting: Maternal Newborn

## 2017-10-28 LAB — MATERNIT 21 PLUS CORE, BLOOD
CHROMOSOME 18: NEGATIVE
Chromosome 13: NEGATIVE
Chromosome 21: NEGATIVE
Y Chromosome: NOT DETECTED

## 2017-10-28 LAB — INHERITEST CORE(CF97,SMA,FRAX)

## 2017-11-08 ENCOUNTER — Encounter: Payer: Self-pay | Admitting: Maternal Newborn

## 2017-11-09 ENCOUNTER — Encounter: Payer: Self-pay | Admitting: Maternal Newborn

## 2017-11-15 ENCOUNTER — Emergency Department: Payer: Medicaid Other

## 2017-11-15 ENCOUNTER — Other Ambulatory Visit: Payer: Self-pay

## 2017-11-15 ENCOUNTER — Emergency Department
Admission: EM | Admit: 2017-11-15 | Discharge: 2017-11-15 | Disposition: A | Discharge status: Home / Self Care | Payer: Medicaid Other | Attending: Emergency Medicine | Admitting: Emergency Medicine

## 2017-11-15 DIAGNOSIS — Z79899 Other long term (current) drug therapy: Secondary | ICD-10-CM | POA: Insufficient documentation

## 2017-11-15 DIAGNOSIS — O368121 Decreased fetal movements, second trimester, fetus 1: Secondary | ICD-10-CM | POA: Insufficient documentation

## 2017-11-15 DIAGNOSIS — Z3A14 14 weeks gestation of pregnancy: Secondary | ICD-10-CM | POA: Insufficient documentation

## 2017-11-15 DIAGNOSIS — O368120 Decreased fetal movements, second trimester, not applicable or unspecified: Secondary | ICD-10-CM

## 2017-11-15 NOTE — Discharge Instructions (Addendum)
Your gestation is still early so it is uncommon to feel regular fetal movement at this time.  Please follow-up with your OB/GYN for further evaluation.  Please return with any bleeding any pain or any other concerns.

## 2017-11-15 NOTE — ED Notes (Signed)
Reviewed discharge instructions and follow-up care with patient. Patient verbalized understanding of all information reviewed. Patient stable, with no distress noted at this time.    

## 2017-11-15 NOTE — ED Notes (Signed)
Patient's last OB visit was at 11 weeks.
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
## 2017-11-16 ENCOUNTER — Encounter: Payer: Self-pay | Admitting: Obstetrics and Gynecology

## 2017-11-16 ENCOUNTER — Ambulatory Visit (INDEPENDENT_AMBULATORY_CARE_PROVIDER_SITE_OTHER): Payer: Medicaid Other | Admitting: Obstetrics and Gynecology

## 2017-11-16 VITALS — BP 110/60 | Wt 186.0 lb

## 2017-11-16 DIAGNOSIS — O9989 Other specified diseases and conditions complicating pregnancy, childbirth and the puerperium: Secondary | ICD-10-CM

## 2017-11-16 DIAGNOSIS — Z8659 Personal history of other mental and behavioral disorders: Secondary | ICD-10-CM

## 2017-11-16 DIAGNOSIS — Z3480 Encounter for supervision of other normal pregnancy, unspecified trimester: Secondary | ICD-10-CM

## 2017-11-16 DIAGNOSIS — Z3A14 14 weeks gestation of pregnancy: Secondary | ICD-10-CM

## 2017-11-16 DIAGNOSIS — Z6791 Unspecified blood type, Rh negative: Secondary | ICD-10-CM

## 2017-11-16 DIAGNOSIS — O26899 Other specified pregnancy related conditions, unspecified trimester: Secondary | ICD-10-CM

## 2017-11-16 NOTE — Progress Notes (Signed)
Routine Prenatal Care Visit  Subjective  Vicki Adams is a 22 y.o. G3P2002 at [redacted]w[redacted]d being seen today for ongoing prenatal care.  She is currently monitored for the following issues for this high-risk pregnancy and has History of percutaneous transcatheter closure of congenital ASD; History of esophageal hernia repair; Congenital heart disease, maternal, antepartum; Rh negative state in antepartum period; Supervision of other normal pregnancy, antepartum; and History of postpartum depression, currently pregnant on their problem list.  ----------------------------------------------------------------------------------- Patient reports continued headaches, almost daily, worse than outside of pregnancy. Tylenol does not improve them. Nausea and vomiting is improving. Contractions: Not present. Vag. Bleeding: None.  Movement: Absent. Denies leaking of fluid.  ----------------------------------------------------------------------------------- The following portions of the patient's history were reviewed and updated as appropriate: allergies, current medications, past family history, past medical history, past social history, past surgical history and problem list. Problem list updated.   Objective  Blood pressure 110/60, weight 186 lb (84.4 kg), last menstrual period 07/31/2017, unknown if currently breastfeeding. Pregravid weight 180 lb (81.6 kg) Total Weight Gain 6 lb (2.722 kg) Urinalysis: Urine Protein: Negative Urine Glucose: Negative  Fetal Status: Fetal Heart Rate (bpm): 158   Movement: Absent     General:  Alert, oriented and cooperative. Patient is in no acute distress.  Skin: Skin is warm and dry. No rash noted.   Cardiovascular: Normal heart rate noted  Respiratory: Normal respiratory effort, no problems with respiration noted  Abdomen: Soft, gravid, appropriate for gestational age. Pain/Pressure: Present     Pelvic:  Cervical exam deferred        Extremities: Normal range  of motion.  Edema: None  ental Status: Normal mood and affect. Normal behavior. Normal judgment and thought content.     Assessment   22 y.o. [id number] at [redacted]w[redacted]d by  05/14/2018, by Ultrasound presenting for routine prenatal visit  Plan   THIRD Problems (from 09/07/17 to present)    Problem Noted Resolved   Rh negative state in antepartum period 09/07/2017 by Oswaldo Conroy, CNM No   Overview Signed 09/21/2017  4:27 PM by Vena Austria, MD     rhogam 28 weeks      Supervision of other normal pregnancy, antepartum 09/07/2017 by Oswaldo Conroy, CNM No   Overview Addendum 09/21/2017  4:30 PM by Vena Austria, MD    [facility]inic [facility] Prenatal Labs  Dating 6 week [place] Blood type: O/Negative/-- (03/19 1511)   Genetic Screen NIPS:    AFP:      Carrier testing Fragile X, SMA, CF: Antibody:Negative (03/19 1511)  Anatomic [place]  Rubella: 1.65 (03/19 1511) Varicella: Non-immune  GTT Early:               Third trimester:  RPR: Non Reactive (03/19 1511)   Rhogam  28 weeks HBsAg: Negative (03/19 1511)   TDaP vaccine                       HIV: Non Reactive (03/19 1511)   Baby Food                                GBS:   Contraception  Pap: 09/07/2017 NILM  CBB   Maternal history of ASD  fetal echo at 20-24 weeks  CS/VBAC    Support Person              Congenital heart disease, maternal,  antepartum 01/23/2016 by Grotegut, [place], MD No   Overview Signed 09/21/2017  4:26 PM by Vena Austria, MD     Fetal Echo at 20-[redacted] weeks          Gestational age appropriate obstetric precautions including but not limited to vaginal bleeding, contractions, leaking of fluid and fetal movement were reviewed in detail with the patient.    Patient having headaches, she will try fiorciet, she has not picked this prescription up yet.  Follow up in 2 weeks if headaches are not improved.  Return in about 1 month (around 12/14/2017) for ROB and anatomy US.   [name] MD [facility], [facility] 11/16/17 9:24 AM

## 2017-11-21 ENCOUNTER — Encounter: Payer: Self-pay | Admitting: Maternal Newborn

## 2017-11-22 ENCOUNTER — Emergency Department: Payer: Self-pay

## 2017-11-22 ENCOUNTER — Ambulatory Visit (INDEPENDENT_AMBULATORY_CARE_PROVIDER_SITE_OTHER): Payer: Medicaid Other | Admitting: Maternal Newborn

## 2017-11-22 ENCOUNTER — Other Ambulatory Visit: Payer: Self-pay

## 2017-11-22 ENCOUNTER — Encounter: Payer: Self-pay | Admitting: Maternal Newborn

## 2017-11-22 ENCOUNTER — Emergency Department
Admission: EM | Admit: 2017-11-22 | Discharge: 2017-11-22 | Disposition: A | Discharge status: Home / Self Care | Payer: Self-pay | Attending: Emergency Medicine | Admitting: Emergency Medicine

## 2017-11-22 VITALS — BP 120/60 | Wt 181.0 lb

## 2017-11-22 DIAGNOSIS — N898 Other specified noninflammatory disorders of vagina: Secondary | ICD-10-CM | POA: Diagnosis not present

## 2017-11-22 DIAGNOSIS — B3731 Acute candidiasis of vulva and vagina: Secondary | ICD-10-CM

## 2017-11-22 DIAGNOSIS — Z3A00 Weeks of gestation of pregnancy not specified: Secondary | ICD-10-CM | POA: Insufficient documentation

## 2017-11-22 DIAGNOSIS — O208 Other hemorrhage in early pregnancy: Secondary | ICD-10-CM

## 2017-11-22 DIAGNOSIS — Z3480 Encounter for supervision of other normal pregnancy, unspecified trimester: Secondary | ICD-10-CM

## 2017-11-22 DIAGNOSIS — Z5321 Procedure and treatment not carried out due to patient leaving prior to being seen by health care provider: Secondary | ICD-10-CM | POA: Insufficient documentation

## 2017-11-22 DIAGNOSIS — O209 Hemorrhage in early pregnancy, unspecified: Secondary | ICD-10-CM | POA: Insufficient documentation

## 2017-11-22 DIAGNOSIS — B373 Candidiasis of vulva and vagina: Secondary | ICD-10-CM

## 2017-11-22 LAB — POCT WET PREP (WET MOUNT)
Clue Cells Wet Prep Whiff POC: NEGATIVE
TRICHOMONAS WET PREP HPF POC: ABSENT

## 2017-11-22 LAB — ABO/RH: ABO/RH(D): O NEG

## 2017-11-22 LAB — HCG, QUANTITATIVE, PREGNANCY: hCG, Beta Chain, Quant, S: 102454 m[IU]/mL — ABNORMAL HIGH (ref <5)

## 2017-11-22 MED ORDER — TERCONAZOLE 0.4 % VA CREA: 1.0000 | TOPICAL_CREAM | Freq: Every day | VAGINAL | 0 refills | Status: AC

## 2017-11-22 NOTE — ED Triage Notes (Signed)
Patient reports vaginal bleeding, got heavier around 9 pm.  Discomfort to abdomen.

## 2017-11-22 NOTE — ED Notes (Signed)
Rounding in waiting room; pt is no longer present;

## 2017-11-22 NOTE — ED Notes (Signed)
Rounding in waiting room; pt is not present

## 2017-11-22 NOTE — Progress Notes (Signed)
Prenatal Problem Visit  Subjective  Vicki Adams is a 22 y.o. G3P2002 at [redacted]w[redacted]d being seen today for ongoing prenatal care.  She is currently monitored for the following issues for this low-risk pregnancy and has History of percutaneous transcatheter closure of congenital ASD; History of esophageal hernia repair; Congenital heart disease, maternal, antepartum; Rh negative state in antepartum period; Supervision of other normal pregnancy, antepartum; History of postpartum depression, currently pregnant; and Vaginal irritation on their problem list.  ----------------------------------------------------------------------------------- Patient reports vaginal irritation and vaginal itching. Some spotting and vaginal discharge when wiping. Has applied Desitin ointment without relief. ----------------------------------------------------------------------------------- The following portions of the patient's history were reviewed and updated as appropriate: allergies, current medications, past family history, past medical history, past social history, past surgical history and problem list. Problem list updated.   Objective   Vitals:   11/22/17 1534  BP: 120/60    Pregravid weight 180 lb (81.6 kg) Total Weight Gain 1 lb (0.454 kg) Urinalysis: No sample Fetal Status: Fetal Heart Rate (bpm): 160       General:  Alert, oriented and cooperative. Patient is in no acute distress.  Skin: Skin is warm and dry. No rash noted.   Cardiovascular: Normal heart rate noted  Respiratory: Normal respiratory effort, no problems with respiration noted  Abdomen: Soft, gravid, appropriate for gestational age. Pain/Pressure: Absent     Pelvic:  Cervical exam deferred        Extremities: Normal range of motion.  Edema: None  Mental Status: Normal mood and affect. Normal behavior. Normal judgment and thought content.   Vulvar area and vestibule are irritated with mild excoriation on left labia. White  discharge present. Wet prep positive for yeast, negative clue cells, negative whiff, pH <4, no trichomonads.  Assessment   22 y.o. [id number] at [redacted]w[redacted]d, EDD 05/14/2018 by Ultrasound presenting for work-in prenatal visit.  Plan   THIRD Problems (from 09/07/17 to present)    Problem Noted Resolved   Rh negative state in antepartum period 09/07/2017 by Oswaldo Conroy, CNM No   Overview Signed 09/21/2017  4:27 PM by Vena Austria, MD    [ ]  rhogam 28 weeks      Supervision of other normal pregnancy, antepartum 09/07/2017 by Oswaldo Conroy, CNM No   Overview Addendum 09/21/2017  4:30 PM by Vena Austria, MD    [facility]inic [facility] Prenatal Labs  Dating 6 week [place] Blood type: O/Negative/-- (03/19 1511)   Genetic Screen NIPS:    AFP:      Carrier testing Fragile X, SMA, CF: Antibody:Negative (03/19 1511)  Anatomic [place]  Rubella: 1.65 (03/19 1511) Varicella: Non-immune  GTT Early:               Third trimester:  RPR: Non Reactive (03/19 1511)   Rhogam [ ]  28 weeks HBsAg: Negative (03/19 1511)   TDaP vaccine                       HIV: Non Reactive (03/19 1511)   Baby Food                                GBS:   Contraception  Pap: 09/07/2017 NILM  CBB   Maternal history of ASD [ ]  fetal echo at 20-24 weeks  CS/VBAC    Support Person              Congenital heart  disease, maternal, antepartum 01/23/2016 by Grotegut, [place], MD No   Overview Signed 09/21/2017  4:26 PM by Vena AustriaStaebler, Andreas, MD    [ ]  Fetal Echo at 20-24 weeks       Rx sent for terconazole 7 day treatment and may use OTC topical cream for external irritation. NuSwab sent to rule out other co-infection.  Keep scheduled ROB.  Marcelyn BruinsJacelyn Carnita Golob, CNM 11/22/2017  4:29 PM

## 2017-11-22 NOTE — Telephone Encounter (Signed)
Pt scheduled working appt which she kept.

## 2017-11-22 NOTE — Progress Notes (Signed)
C/o vaginal itching, stinging pain, doesn't burn c voiding, swollen, bleeding.

## 2017-11-24 LAB — NUSWAB VAGINITIS PLUS (VG+)
CHLAMYDIA TRACHOMATIS, NAA: NEGATIVE
Candida albicans, NAA: NEGATIVE
Candida glabrata, NAA: NEGATIVE
NEISSERIA GONORRHOEAE, NAA: NEGATIVE
Trich vag by NAA: NEGATIVE

## 2017-12-09 ENCOUNTER — Encounter: Payer: Self-pay | Admitting: Maternal Newborn

## 2017-12-14 ENCOUNTER — Ambulatory Visit (INDEPENDENT_AMBULATORY_CARE_PROVIDER_SITE_OTHER): Payer: Self-pay

## 2017-12-14 ENCOUNTER — Ambulatory Visit (INDEPENDENT_AMBULATORY_CARE_PROVIDER_SITE_OTHER): Payer: Self-pay | Admitting: Certified Nurse Midwife

## 2017-12-14 VITALS — BP 100/60 | Wt 183.0 lb

## 2017-12-14 DIAGNOSIS — Z3A18 18 weeks gestation of pregnancy: Secondary | ICD-10-CM

## 2017-12-14 DIAGNOSIS — Z3480 Encounter for supervision of other normal pregnancy, unspecified trimester: Secondary | ICD-10-CM

## 2017-12-14 DIAGNOSIS — O99419 Diseases of the circulatory system complicating pregnancy, unspecified trimester: Secondary | ICD-10-CM

## 2017-12-14 DIAGNOSIS — Q249 Congenital malformation of heart, unspecified: Principal | ICD-10-CM

## 2017-12-14 DIAGNOSIS — Z3482 Encounter for supervision of other normal pregnancy, second trimester: Secondary | ICD-10-CM

## 2017-12-14 DIAGNOSIS — Z363 Encounter for antenatal screening for malformations: Secondary | ICD-10-CM

## 2017-12-14 DIAGNOSIS — Z8774 Personal history of (corrected) congenital malformations of heart and circulatory system: Secondary | ICD-10-CM

## 2017-12-14 NOTE — Progress Notes (Signed)
Pt reports no problems. Anatomy scan today.

## 2017-12-16 NOTE — Progress Notes (Signed)
ROB at 18wk3days:Anatomy scan today CGA 18wk5d with 154 FCA, breech, posterior placenta. Normal but incomplete antomy scan: need RVOT, LVOT, diaphragm, N/L, bladder, sagital spine. Mother with history of ASD repair at age 405 Fetal echo to be scheduled Dr Bonney AidStaebler would also like to see if patient needs another echo Her last echo was 2017 when she was pregnant with her last baby. RTO 4 weeks for FU anatomy scan Vicki Adams, Vicki Adams

## 2017-12-27 ENCOUNTER — Encounter: Payer: Self-pay | Admitting: Maternal Newborn

## 2017-12-30 ENCOUNTER — Encounter: Payer: Self-pay | Admitting: Maternal Newborn

## 2018-01-04 ENCOUNTER — Ambulatory Visit (INDEPENDENT_AMBULATORY_CARE_PROVIDER_SITE_OTHER): Payer: Medicaid Other

## 2018-01-04 ENCOUNTER — Ambulatory Visit (INDEPENDENT_AMBULATORY_CARE_PROVIDER_SITE_OTHER): Payer: Medicaid Other | Admitting: Obstetrics & Gynecology

## 2018-01-04 VITALS — BP 110/62 | Wt 183.0 lb

## 2018-01-04 DIAGNOSIS — Z3482 Encounter for supervision of other normal pregnancy, second trimester: Secondary | ICD-10-CM | POA: Diagnosis not present

## 2018-01-04 DIAGNOSIS — Z3480 Encounter for supervision of other normal pregnancy, unspecified trimester: Secondary | ICD-10-CM

## 2018-01-04 DIAGNOSIS — Z8774 Personal history of (corrected) congenital malformations of heart and circulatory system: Secondary | ICD-10-CM

## 2018-01-04 DIAGNOSIS — O09292 Supervision of pregnancy with other poor reproductive or obstetric history, second trimester: Secondary | ICD-10-CM

## 2018-01-04 DIAGNOSIS — O99891 Other specified diseases and conditions complicating pregnancy: Secondary | ICD-10-CM

## 2018-01-04 DIAGNOSIS — Z3A21 21 weeks gestation of pregnancy: Secondary | ICD-10-CM | POA: Diagnosis not present

## 2018-01-04 DIAGNOSIS — Z8659 Personal history of other mental and behavioral disorders: Secondary | ICD-10-CM

## 2018-01-04 DIAGNOSIS — O9989 Other specified diseases and conditions complicating pregnancy, childbirth and the puerperium: Secondary | ICD-10-CM

## 2018-01-04 NOTE — Progress Notes (Addendum)
C/o not being able to stand for a long periods of time    Dizzy, tunnel vision, h/o syncope last pregnancy and feels this is developing now Subjective  Fetal Movement? yes Contractions? no Leaking Fluid? no Vaginal Bleeding? no  Objective  BP 110/62   Wt 183 lb (83 kg)   LMP 07/31/2017   BMI 28.66 kg/m  General: NAD Pumonary: no increased work of breathing Abdomen: gravid, non-tender Extremities: no edema Psychiatric: mood appropriate, affect full  Assessment  22 y.o. [id number] at 1964w3d by  05/14/2018, by Ultrasound presenting for routine prenatal visit  Plan   Problem List Items Addressed This Visit      Other   History of percutaneous transcatheter closure of congenital ASD   Supervision of other normal pregnancy, antepartum   History of postpartum depression, currently pregnant    Other Visit Diagnoses    [redacted] weeks gestation of pregnancy    -  Primary    Review of ULTRASOUND. I have personally reviewed images and [facility]. There is a singleton gestation with subjectively normal amniotic fluid volume. The fetal biometry correlates with established dating. Detailed evaluation of the fetal anatomy was performed.The fetal anatomical survey appears within normal limits within the resolution of ultrasound as described above.  It must be noted that a normal ultrasound is unable to rule out fetal aneuploidy.    Fluids advised, monitor for dizzy spells  Echo sch for Monday  Annamarie MajorPaul Harris, MD, Merlinda FrederickFACOG [facility], [facility] 01/04/2018  10:30 AM

## 2018-01-04 NOTE — Patient Instructions (Signed)

## 2018-01-07 ENCOUNTER — Encounter: Payer: Self-pay | Admitting: Maternal Newborn

## 2018-01-10 ENCOUNTER — Encounter: Payer: Self-pay | Admitting: Maternal Newborn

## 2018-01-18 ENCOUNTER — Encounter: Payer: Self-pay | Admitting: Maternal Newborn

## 2018-01-18 ENCOUNTER — Encounter: Payer: Self-pay | Admitting: Emergency Medicine

## 2018-01-18 ENCOUNTER — Other Ambulatory Visit: Payer: Self-pay

## 2018-01-18 ENCOUNTER — Observation Stay
Admission: EM | Admit: 2018-01-18 | Discharge: 2018-01-18 | Disposition: A | Discharge status: Home / Self Care | Payer: Medicaid Other | Attending: Obstetrics and Gynecology | Admitting: Obstetrics and Gynecology

## 2018-01-18 DIAGNOSIS — Q249 Congenital malformation of heart, unspecified: Secondary | ICD-10-CM

## 2018-01-18 DIAGNOSIS — R109 Unspecified abdominal pain: Secondary | ICD-10-CM | POA: Diagnosis not present

## 2018-01-18 DIAGNOSIS — O99612 Diseases of the digestive system complicating pregnancy, second trimester: Secondary | ICD-10-CM | POA: Diagnosis not present

## 2018-01-18 DIAGNOSIS — Z820 Family history of epilepsy and other diseases of the nervous system: Secondary | ICD-10-CM | POA: Insufficient documentation

## 2018-01-18 DIAGNOSIS — Z6791 Unspecified blood type, Rh negative: Secondary | ICD-10-CM

## 2018-01-18 DIAGNOSIS — R197 Diarrhea, unspecified: Secondary | ICD-10-CM | POA: Diagnosis not present

## 2018-01-18 DIAGNOSIS — Z3480 Encounter for supervision of other normal pregnancy, unspecified trimester: Secondary | ICD-10-CM

## 2018-01-18 DIAGNOSIS — O26892 Other specified pregnancy related conditions, second trimester: Secondary | ICD-10-CM | POA: Diagnosis present

## 2018-01-18 DIAGNOSIS — Z841 Family history of disorders of kidney and ureter: Secondary | ICD-10-CM | POA: Insufficient documentation

## 2018-01-18 DIAGNOSIS — R112 Nausea with vomiting, unspecified: Secondary | ICD-10-CM | POA: Diagnosis not present

## 2018-01-18 DIAGNOSIS — Z8489 Family history of other specified conditions: Secondary | ICD-10-CM | POA: Diagnosis not present

## 2018-01-18 DIAGNOSIS — O99419 Diseases of the circulatory system complicating pregnancy, unspecified trimester: Secondary | ICD-10-CM

## 2018-01-18 DIAGNOSIS — Z881 Allergy status to other antibiotic agents status: Secondary | ICD-10-CM | POA: Insufficient documentation

## 2018-01-18 DIAGNOSIS — Z8249 Family history of ischemic heart disease and other diseases of the circulatory system: Secondary | ICD-10-CM | POA: Diagnosis not present

## 2018-01-18 DIAGNOSIS — R079 Chest pain, unspecified: Secondary | ICD-10-CM | POA: Diagnosis not present

## 2018-01-18 DIAGNOSIS — Z3A23 23 weeks gestation of pregnancy: Secondary | ICD-10-CM | POA: Diagnosis not present

## 2018-01-18 DIAGNOSIS — Z8269 Family history of other diseases of the musculoskeletal system and connective tissue: Secondary | ICD-10-CM | POA: Insufficient documentation

## 2018-01-18 DIAGNOSIS — O26899 Other specified pregnancy related conditions, unspecified trimester: Secondary | ICD-10-CM

## 2018-01-18 LAB — URINALYSIS, COMPLETE (UACMP) WITH MICROSCOPIC
BILIRUBIN URINE: NEGATIVE
GLUCOSE, UA: NEGATIVE mg/dL
HGB URINE DIPSTICK: NEGATIVE
Ketones, ur: 80 mg/dL — AB
Nitrite: NEGATIVE
PROTEIN: 30 mg/dL — AB
Specific Gravity, Urine: 1.025 (ref 1.005–1.030)
pH: 5 (ref 5.0–8.0)

## 2018-01-18 LAB — CBC
HEMATOCRIT: 30.9 % — AB (ref 35.0–47.0)
HEMOGLOBIN: 10.6 g/dL — AB (ref 12.0–16.0)
MCH: 26.4 pg (ref 26.0–34.0)
MCHC: 34.3 g/dL (ref 32.0–36.0)
MCV: 77 fL — ABNORMAL LOW (ref 80.0–100.0)
Platelets: 162 10*3/uL (ref 150–440)
RBC: 4.01 MIL/uL (ref 3.80–5.20)
RDW: 16.8 % — AB (ref 11.5–14.5)
WBC: 8.2 10*3/uL (ref 3.6–11.0)

## 2018-01-18 LAB — COMPREHENSIVE METABOLIC PANEL
ALK PHOS: 55 U/L (ref 38–126)
ALT: 9 U/L (ref 0–44)
ANION GAP: 7 (ref 5–15)
AST: 17 U/L (ref 15–41)
Albumin: 3.1 g/dL — ABNORMAL LOW (ref 3.5–5.0)
BILIRUBIN TOTAL: 0.4 mg/dL (ref 0.3–1.2)
BUN: 8 mg/dL (ref 6–20)
CALCIUM: 8.4 mg/dL — AB (ref 8.9–10.3)
CO2: 24 mmol/L (ref 22–32)
Chloride: 106 mmol/L (ref 98–111)
Creatinine, Ser: 0.58 mg/dL (ref 0.44–1.00)
GFR calc Af Amer: >60 mL/min (ref >60)
GFR calc non Af Amer: >60 mL/min (ref >60)
GLUCOSE: 97 mg/dL (ref 70–99)
Potassium: 3.4 mmol/L — ABNORMAL LOW (ref 3.5–5.1)
Sodium: 137 mmol/L (ref 135–145)
Total Protein: 6.7 g/dL (ref 6.5–8.1)

## 2018-01-18 LAB — URINE DRUG SCREEN, QUALITATIVE (ARMC ONLY)
AMPHETAMINES, UR SCREEN: NOT DETECTED
Barbiturates, Ur Screen: NOT DETECTED
Benzodiazepine, Ur Scrn: NOT DETECTED
Cannabinoid 50 Ng, Ur ~~LOC~~: NOT DETECTED
Cocaine Metabolite,Ur ~~LOC~~: NOT DETECTED
MDMA (ECSTASY) UR SCREEN: NOT DETECTED
METHADONE SCREEN, URINE: NOT DETECTED
Opiate, Ur Screen: NOT DETECTED
PHENCYCLIDINE (PCP) UR S: NOT DETECTED
TRICYCLIC, UR SCREEN: NOT DETECTED

## 2018-01-18 LAB — LIPASE, BLOOD: Lipase: 32 U/L (ref 11–51)

## 2018-01-18 MED ORDER — SIMETHICONE 80 MG PO CHEW: 80.0000 mg | CHEWABLE_TABLET | Freq: Four times a day (QID) | ORAL | Status: DC | PRN

## 2018-01-18 MED ORDER — ONDANSETRON 4 MG PO TBDP: 4.0000 mg | ORAL_TABLET | Freq: Once | ORAL | 0 refills | Status: AC

## 2018-01-18 MED ORDER — ONDANSETRON HCL 4 MG/2ML IJ SOLN: 4.0000 mg | Freq: Four times a day (QID) | INTRAMUSCULAR | Status: DC | PRN

## 2018-01-18 MED ORDER — ONDANSETRON 4 MG PO TBDP
4.0000 mg | ORAL_TABLET | Freq: Once | ORAL | Status: AC
  Administered 2018-01-18: 4 mg via ORAL
  Filled 2018-01-18: qty 1

## 2018-01-18 MED ORDER — LOPERAMIDE HCL 2 MG PO CAPS
4.0000 mg | ORAL_CAPSULE | Freq: Once | ORAL | Status: AC
  Administered 2018-01-18: 4 mg via ORAL
  Filled 2018-01-18: qty 2

## 2018-01-18 MED ORDER — SODIUM CHLORIDE 0.9 % IV BOLUS
1000.0000 mL | Freq: Once | INTRAVENOUS | Status: AC
  Administered 2018-01-18: 1000 mL via INTRAVENOUS

## 2018-01-18 MED ORDER — DEXTROSE-NACL 5-0.45 % IV SOLN: INTRAVENOUS | Status: DC

## 2018-01-18 NOTE — ED Notes (Signed)
Contraction , 23 weeks preg complaining of vomiting and chest pain with hx of cardiac atrial defect.

## 2018-01-18 NOTE — Discharge Summary (Signed)
See final progress note. 

## 2018-01-18 NOTE — OB Triage Note (Signed)
Pt is a G3P2 23wk3d w/ c/o of abdominal pain and cramping. Pt states the abdominal pain started around 0700 am this morning. Pt states she started vomiting around 0700 and it has been constant since then. Pt rates her pain a 6/10. Pt states she took tylenol and it did not improve. MD at bedside. FHT 156.

## 2018-01-18 NOTE — ED Provider Notes (Addendum)
[facility] Emergency Department Provider Note  ____________________________________________   I have reviewed the triage vital signs and the nursing notes. Where available I have reviewed prior notes and, if possible and indicated, outside hospital notes.    HISTORY  Chief Complaint No chief complaint on file.    HPI Vicki Adams is a 22 y.o. female who was brought to the emergency room.  She is [redacted] weeks pregnant by prior ultrasound, G3, P2, patient states she is had no gush of fluid, or bleeding however she is been having cramping abdominal pain consistent with what she believes to be like labor, this is been going on since this morning.  She states she has had some loose stools.  She is also had some vomiting.  She denies any focal abdominal pain, states her entire abdomen hurts and she feels that this is a feeling consistent with labor.  She is followed by [facility].  She initially mentioned something about how this was like chest pain but when she says chest pain she indicates her abdomen, actually around the level of her umbilicus.   Past Medical History:  Diagnosis Date  . Anemia   . 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 Syncope and collapse     Patient Active Problem List   Diagnosis Date Noted  . Vaginal irritation 11/22/2017  . History of postpartum depression, currently pregnant 10/19/2017  . Rh negative state in antepartum period 09/07/2017  . Supervision of other normal pregnancy, antepartum 09/07/2017  . Congenital heart disease, maternal, antepartum 01/23/2016  . History of percutaneous transcatheter closure of congenital ASD 01/24/2015  . History of esophageal hernia repair 01/24/2015    Past Surgical History:  Procedure Laterality Date  . CARDIAC SURGERY  11/2000  . HERNIA REPAIR  11-24-1995   congential diaphragmatic hernia repair done at 48 weeks of age at [facility]    Prior to Admission medications   Medication Sig  Start Date End Date Taking? Authorizing Provider  acetaminophen (TYLENOL) 500 MG tablet Take 2 tablets by mouth as needed.    [provider]  Butalbital-APAP-Caffeine [id number] MG capsule Take 1-2 capsules by mouth every 6 (six) hours as needed for headache. Patient not taking: Reported on 01/04/2018 10/19/17   Oswaldo Conroy, CNM  Prenat w/o A-FeCbGl-DSS-FA-DHA (CITRANATAL DHA) 27-1 & 250 MG tablet Take 1 tablet by mouth daily. 09/23/17   Tresea Mall, CNM    Allergies Erythromycin  Family History  Problem Relation Age of Onset  . Migraines Mother   . Kidney disease Father   . Obesity Father   . Heart failure Father   . Scoliosis Sister   . Emphysema Maternal Grandmother   . Heart failure Maternal Grandmother     Social History Social History   Tobacco Use  . Smoking status: Never Smoker  . Smokeless tobacco: Never Used  Substance Use Topics  . Alcohol use: No    Comment: occ  . Drug use: No    Comment: ACID    Review of Systems Constitutional: No fever/chills Eyes: No visual changes. ENT: No sore throat. No stiff neck no neck pain Cardiovascular: Denies chest pain. Respiratory: Denies shortness of breath. Gastrointestinal:   See HPI Genitourinary: Negative for dysuria. Musculoskeletal: Negative lower extremity swelling Skin: Negative for rash. Neurological: Negative for severe headaches, focal weakness or numbness.   ____________________________________________   PHYSICAL EXAM:  VITAL SIGNS: ED Triage Vitals  Enc Vitals Group  BP      Pulse      Resp      Temp      Temp src      SpO2      Weight      Height      Head Circumference      Peak Flow      Pain Score      Pain Loc      Pain Edu?      Excl. in GC?     Constitutional: Alert and oriented. Well appearing and in no acute distress. Eyes: Conjunctivae are normal Head: Atraumatic HEENT: No congestion/rhinnorhea. Mucous membranes are moist.  Oropharynx non-erythematous Neck:    Nontender with no meningismus, no masses, no stridor Cardiovascular: Normal rate, regular rhythm. Grossly normal heart sounds.  Good peripheral circulation. Respiratory: Normal respiratory effort.  No retractions. Lungs CTAB. Abdominal: Soft and nonfocal diffuse tenderness noted uterus noted. No distention. No guarding no rebound Back:  There is no focal tenderness or step off.  there is no midline tenderness there are no lesions noted. there is no CVA tenderness Musculoskeletal: No lower extremity tenderness, no upper extremity tenderness. No joint effusions, no DVT signs strong distal pulses no edema Neurologic:  Normal speech and language. No gross focal neurologic deficits are appreciated.  Skin:  Skin is warm, dry and intact. No rash noted. Psychiatric: Mood and affect are normal. Speech and behavior are normal.  ____________________________________________   LABS (all labs ordered are listed, but only abnormal results are displayed)  Labs Reviewed - No data to display  Pertinent labs  results that were available during my care of the patient were reviewed by me and considered in my medical decision making (see chart for details). ____________________________________________  EKG  I personally interpreted any EKGs ordered by me or triage Normal sinus rhythm, rate 99 bpm no acute ST elevation or depression no acute abnormality ____________________________________________  RADIOLOGY  Pertinent labs & imaging results that were available during my care of the patient were reviewed by me and considered in my medical decision making (see chart for details). If possible, patient and/or family made aware of any abnormal findings.  No results found. ____________________________________________    PROCEDURES  Procedure(s) performed: None  Procedures  Critical Care performed: None  ____________________________________________   INITIAL IMPRESSION / ASSESSMENT AND PLAN / ED  COURSE  Pertinent labs & imaging results that were available during my care of the patient were reviewed by me and considered in my medical decision making (see chart for details).  Patient here complaining of rhythmic abdominal pain which is crampy in nature, diffuse on both sides going towards her groin and significantly similar to what she describes as labor pains.  Certainly other pathology such as appendicitis or gallbladder could be present although on exam in the triage room I do not detect them.  The clinical question to be addressed was whether the patient was stable for transfer upstairs as any abdominal pain at this stage is supposed to be evaluated first by L&D.  Is because she thought it was like chest pain that they asked me to see her with a normal EKG, and very reproducible generalized abdominal discomfort with no actual evidence of chest pain when the patient states she has "chest pain" she is indicating her abdomen and not her chest, I think it safer for her to be sent upstairs for tachometry and further evaluation.  Obviously of other pathologies emerge after my limited  exam, patient can be sent back down for further assessment but first I think it best  to see if she needs tocolyse this or other intervention from what sounds like a mostly pregnancy related complaint the patient states she is having labor pains at 23 weeks.  Other GI issues such as gastroenteritis certainly could be implicated and again if the patient is stable from pregnancy point of view these can be further worked up but at this time she has a nonsurgical abdomen and what sounds like a complaint better.with by L&D at this time.  Certainly no evidence of chest pain or ischemia PE or anything else of that variety, patient is complaining of rhythmic cramping abdominal pain Discussed with Dr. Bonney Aid, her OB/GYN who agrees with management we will see her upstairs.   ____________________________________________   FINAL  CLINICAL IMPRESSION(S) / ED DIAGNOSES  Final diagnoses:  None      This chart was dictated using voice recognition software.  Despite best efforts to proofread,  errors can occur which can change meaning.      Jeanmarie Plant, MD 01/18/18 Merton Border, MD 01/18/18 [phone]

## 2018-01-18 NOTE — ED Triage Notes (Addendum)
Pt to triage via w/c with no distress noted; pt st approx [redacted]wks pregnant and since last night having abd pain with N/V/D; st pain feels like contractions; pt st that she is having CP but points to right upper abd pain and denies direct CP; EDC 11/23, G3P2, [facility] at [facility] with no complication; pt denies vag bleeding or fluid; st decreased fetal movement; EKG performed and indicates NSR--Dr McShane in triage to assess pt for any further workup needed in the ED and pt cont to st she is having upper abd pain radiating down like contractions; denies any SHOB or direct upper CP; per MD pt will be sent to L&D directly for further eval

## 2018-01-18 NOTE — Discharge Instructions (Addendum)

## 2018-01-18 NOTE — Final Progress Note (Signed)
Physician Final Progress Note  Patient ID: Vicki Adams MRN: [id number] DOB/AGE: 10/11/1995 22 y.o.  Admit date: 01/18/2018 Admitting provider: Vena Austria, MD Discharge date: 01/18/2018   Admission Diagnoses:  Abdominal pain, nausea and emesis  Discharge Diagnoses:  Active Problems:   Abdominal pain during pregnancy in second trimester  22 year old G3P2002 at [redacted]w[redacted]d presenting with abdominal pain, nausea, emesis, diarrhea.  No fevers, no chills.  No abdominal trauma or vaginal bleeding.  Reports sick contact (daughter) with similar symptoms.  On presentation CBC normal, negative CMP and lipase.  Patient treated with imodium, simethicone, zofran, and IV hydration with resolution of symptoms.  +FM, no LOF, no ctx.  Blood pressure (!) 113/56, pulse 97, temperature 98.4 F (36.9 C), temperature source Oral, resp. rate 18, height 5\' 7"  (1.702 m), weight 183 lb (83 kg), last menstrual period 07/31/2017, SpO2 100 %, unknown if currently breastfeeding.  Consults: none  Significant Findings/ Diagnostic Studies:  Results for orders placed or performed during the hospital encounter of 01/18/18 (from the past 24 hour(s))  Lipase, blood     Status: None   Collection Time: 01/18/18  7:10 PM  Result Value Ref Range   Lipase 32 11 - 51 U/L  Comprehensive metabolic panel     Status: Abnormal   Collection Time: 01/18/18  7:10 PM  Result Value Ref Range   Sodium 137 135 - 145 mmol/L   Potassium 3.4 (L) 3.5 - 5.1 mmol/L   Chloride 106 98 - 111 mmol/L   CO2 24 22 - 32 mmol/L   Glucose, Bld 97 70 - 99 mg/dL   BUN 8 6 - 20 mg/dL   Creatinine, Ser 4.09 0.44 - 1.00 mg/dL   Calcium 8.4 (L) 8.9 - 10.3 mg/dL   Total Protein 6.7 6.5 - 8.1 g/dL   Albumin 3.1 (L) 3.5 - 5.0 g/dL   AST 17 15 - 41 U/L   ALT 9 0 - 44 U/L   Alkaline Phosphatase 55 38 - 126 U/L   Total Bilirubin 0.4 0.3 - 1.2 mg/dL   GFR calc non Af Amer >60 >60 mL/min   GFR calc Af Amer >60 >60 mL/min   Anion gap 7 5 - 15   CBC     Status: Abnormal   Collection Time: 01/18/18  7:10 PM  Result Value Ref Range   WBC 8.2 3.6 - 11.0 K/uL   RBC 4.01 3.80 - 5.20 MIL/uL   Hemoglobin 10.6 (L) 12.0 - 16.0 g/dL   HCT 81.1 (L) 91.4 - 78.2 %   MCV 77.0 (L) 80.0 - 100.0 fL   MCH 26.4 26.0 - 34.0 pg   MCHC 34.3 32.0 - 36.0 g/dL   RDW 95.6 (H) 21.3 - 08.6 %   Platelets 162 150 - 440 K/uL  Urine Drug Screen, Qualitative ([facility] only)     Status: None   Collection Time: 01/18/18  7:55 PM  Result Value Ref Range   Tricyclic, Ur Screen NONE DETECTED NONE DETECTED   Amphetamines, Ur Screen NONE DETECTED NONE DETECTED   MDMA (Ecstasy)Ur Screen NONE DETECTED NONE DETECTED   Cocaine Metabolite,Ur [place] NONE DETECTED NONE DETECTED   Opiate, Ur Screen NONE DETECTED NONE DETECTED   Phencyclidine (PCP) Ur S NONE DETECTED NONE DETECTED   Cannabinoid 50 Ng, Ur [place] NONE DETECTED NONE DETECTED   Barbiturates, Ur Screen NONE DETECTED NONE DETECTED   Benzodiazepine, Ur Scrn NONE DETECTED NONE DETECTED   Methadone Scn, Ur NONE DETECTED NONE DETECTED  Urinalysis,  Complete w Microscopic     Status: Abnormal   Collection Time: 01/18/18  7:55 PM  Result Value Ref Range   Color, Urine AMBER (A) YELLOW   APPearance CLOUDY (A) CLEAR   Specific Gravity, Urine 1.025 1.005 - 1.030   pH 5.0 5.0 - 8.0   Glucose, UA NEGATIVE NEGATIVE mg/dL   Hgb urine dipstick NEGATIVE NEGATIVE   Bilirubin Urine NEGATIVE NEGATIVE   Ketones, ur 80 (A) NEGATIVE mg/dL   Protein, ur 30 (A) NEGATIVE mg/dL   Nitrite NEGATIVE NEGATIVE   Leukocytes, UA SMALL (A) NEGATIVE   RBC / HPF 0-5 0 - 5 RBC/hpf   WBC, UA 11-20 0 - 5 WBC/hpf   Bacteria, UA RARE (A) NONE SEEN   Squamous Epithelial / LPF 11-20 0 - 5   Mucus PRESENT     Procedures:  Baseline: 150 Variability: minimal to moderate Accelerations: absent Decelerations: absent Tocometry: none The patient was monitored for 30 minutes, fetal heart rate appropriate for gestational age  Discharge Condition:  good  Disposition: Discharge disposition: 01-Home or Self Care       Diet: Regular diet  Discharge Activity: Activity as tolerated  Discharge Instructions    Discharge activity:  No Restrictions   Complete by:  As directed    Discharge diet:  No restrictions   Complete by:  As directed    No sexual activity restrictions   Complete by:  As directed    Notify physician for a general feeling that "something is not right"   Complete by:  As directed    Notify physician for increase or change in vaginal discharge   Complete by:  As directed    Notify physician for intestinal cramps, with or without diarrhea, sometimes described as "gas pain"   Complete by:  As directed    Notify physician for leaking of fluid   Complete by:  As directed    Notify physician for low, dull backache, unrelieved by heat or Tylenol   Complete by:  As directed    Notify physician for menstrual like cramps   Complete by:  As directed    Notify physician for pelvic pressure   Complete by:  As directed    Notify physician for uterine contractions.  These may be painless and feel like the uterus is tightening or the baby is  "balling up"   Complete by:  As directed    Notify physician for vaginal bleeding   Complete by:  As directed    PRETERM LABOR:  Includes any of the follwing symptoms that occur between 20 - [redacted] weeks gestation.  If these symptoms are not stopped, preterm labor can result in preterm delivery, placing your baby at risk   Complete by:  As directed      Allergies as of 01/18/2018      Reactions   Erythromycin Anaphylaxis, Swelling, Nausea And Vomiting      Medication List    STOP taking these medications   Butalbital-APAP-Caffeine 50-325-40 MG capsule     TAKE these medications   acetaminophen 500 MG tablet Commonly known as:  TYLENOL Take 2 tablets by mouth as needed.   CITRANATAL DHA 27-1 & 250 MG tablet Take 1 tablet by mouth daily.   ondansetron 4 MG disintegrating  tablet Commonly known as:  ZOFRAN-ODT Take 1 tablet (4 mg total) by mouth once for 1 dose.      Follow-up Information    [facility] Follow up in 1  week(s).   Contact information: [address] [place] [address] [phone]           Total time spent taking care of this patient: 45 minutes  Signed: Vena Austria 01/18/2018, 10:36 PM

## 2018-02-01 ENCOUNTER — Ambulatory Visit (INDEPENDENT_AMBULATORY_CARE_PROVIDER_SITE_OTHER): Payer: Medicaid Other | Admitting: Maternal Newborn

## 2018-02-01 VITALS — BP 100/60 | Wt 181.8 lb

## 2018-02-01 DIAGNOSIS — Z3A25 25 weeks gestation of pregnancy: Secondary | ICD-10-CM

## 2018-02-01 DIAGNOSIS — Z3480 Encounter for supervision of other normal pregnancy, unspecified trimester: Secondary | ICD-10-CM

## 2018-02-01 DIAGNOSIS — O360120 Maternal care for anti-D [Rh] antibodies, second trimester, not applicable or unspecified: Secondary | ICD-10-CM

## 2018-02-01 DIAGNOSIS — M899 Disorder of bone, unspecified: Secondary | ICD-10-CM

## 2018-02-01 DIAGNOSIS — Z3689 Encounter for other specified antenatal screening: Secondary | ICD-10-CM

## 2018-02-01 DIAGNOSIS — O9989 Other specified diseases and conditions complicating pregnancy, childbirth and the puerperium: Secondary | ICD-10-CM

## 2018-02-01 LAB — POCT URINALYSIS DIPSTICK OB: Glucose, UA: NEGATIVE — AB

## 2018-02-01 NOTE — Progress Notes (Signed)
Routine Prenatal Care Visit  Subjective  Vicki Adams is a 22 y.o. G3P2002 at 1559w3d being seen today for ongoing prenatal care.  She is currently monitored for the following issues for this low-risk pregnancy and has History of percutaneous transcatheter closure of congenital ASD; History of esophageal hernia repair; Congenital heart disease, maternal, antepartum; Rh negative state in antepartum period; Supervision of other normal pregnancy, antepartum; History of postpartum depression, currently pregnant; and Abdominal pain during pregnancy in second trimester on their problem list.  ----------------------------------------------------------------------------------- Patient reports pain in the area of her pubic bone and a grinding sensation when she makes certain movements. Also having Braxton-Hicks contractions. Contractions: Not present. Vag. Bleeding: None.  Movement: Present. No leaking of fluid.  ----------------------------------------------------------------------------------- The following portions of the patient's history were reviewed and updated as appropriate: allergies, current medications, past family history, past medical history, past social history, past surgical history and problem list. Problem list updated.  Objective  Blood pressure 100/60, weight 181 lb 12 oz (82.4 kg), last menstrual period 07/31/2017. Pregravid weight 180 lb (81.6 kg) Total Weight Gain 1 lb 12 oz (0.794 kg) Body mass index is 28.47 kg/m. Urinalysis:      Fetal Status: Fetal Heart Rate (bpm): 160 Fundal Height: 25 cm Movement: Present     General:  Alert, oriented and cooperative. Patient is in no acute distress.  Skin: Skin is warm and dry. No rash noted.   Cardiovascular: Normal heart rate noted  Respiratory: Normal respiratory effort, no problems with respiration noted  Abdomen: Soft, gravid, appropriate for gestational age. Pain/Pressure: Present     Pelvic:  Cervical exam deferred         Extremities: Normal range of motion.  Edema: Trace  Mental Status: Normal mood and affect. Normal behavior. Normal judgment and thought content.    Assessment   22 y.o. [id number] at 7759w3d, EDD 05/14/2018 by Ultrasound presenting for routine prenatal visit.  Plan   THIRD Problems (from 09/07/17 to present)    Problem Noted Resolved   Rh negative state in antepartum period 09/07/2017 by Oswaldo ConroySchmid, Fortunata Betty Y, CNM No   Overview Signed 09/21/2017  4:27 PM by Vena AustriaStaebler, Andreas, MD    [ ]  rhogam 28 weeks      Supervision of other normal pregnancy, antepartum 09/07/2017 by Oswaldo ConroySchmid, Kason Benak Y, CNM No   Overview Addendum 09/21/2017  4:30 PM by Vena AustriaStaebler, Andreas, MD    [facility] Prenatal Labs  Dating 6 week US Blood type: O/Negative/-- (03/19 1511)   Genetic Screen NIPS:    AFP:      Carrier testing Fragile X, SMA, CF: Antibody:Negative (03/19 1511)  Anatomic US  Rubella: 1.65 (03/19 1511) Varicella: Non-immune  GTT Early:               Third trimester:  RPR: Non Reactive (03/19 1511)   Rhogam [ ]  28 weeks HBsAg: Negative (03/19 1511)   TDaP vaccine                       HIV: Non Reactive (03/19 1511)   Baby Food                                GBS:   Contraception  Pap: 09/07/2017 NILM  CBB   Maternal history of ASD [ ]  fetal echo at 20-24 weeks  CS/VBAC    Support Person  Congenital heart disease, maternal, antepartum 01/23/2016 by Grotegut, [place], MD No   Overview Addendum 01/23/2018  9:22 PM by Farrel ConnersGutierrez, Colleen, CNM    Hx of ASD repair at age 89 Had a normal echocardiogram 01/2016 which was normal [x ] Fetal Echo at 20-24 weeks-[facility] ( ) is another echo needed?        Pelvic floor PT referral for pubic bone/pelvic pain.  Discussed that she desires Nexplanon for contraception postpartum.  Growth scan next visit for low weight gain in pregnancy.  Gestational age appropriate obstetric precautions were reviewed.  Return in about 3 weeks  (around 02/22/2018) for ROB with GTT/28 week labs.  Marcelyn BruinsJacelyn Karee Christopherson, CNM 02/01/2018

## 2018-02-01 NOTE — Progress Notes (Signed)
C/O pelvic pain - constant, some tightening, doesn't hurt to pee.rj

## 2018-02-05 ENCOUNTER — Encounter: Payer: Self-pay | Admitting: Maternal Newborn

## 2018-02-14 ENCOUNTER — Other Ambulatory Visit: Payer: Self-pay | Admitting: Maternal Newborn

## 2018-02-14 DIAGNOSIS — L299 Pruritus, unspecified: Secondary | ICD-10-CM

## 2018-02-14 DIAGNOSIS — O99712 Diseases of the skin and subcutaneous tissue complicating pregnancy, second trimester: Principal | ICD-10-CM

## 2018-02-14 NOTE — Telephone Encounter (Signed)
Please advise 

## 2018-02-14 NOTE — Progress Notes (Signed)
Testing to rule out ICP with persistent and worsening pruritis.

## 2018-02-15 ENCOUNTER — Encounter: Payer: Self-pay | Admitting: Nurse Practitioner

## 2018-02-15 ENCOUNTER — Ambulatory Visit (INDEPENDENT_AMBULATORY_CARE_PROVIDER_SITE_OTHER): Payer: Medicaid Other | Admitting: Nurse Practitioner

## 2018-02-15 ENCOUNTER — Other Ambulatory Visit: Payer: Medicaid Other

## 2018-02-15 VITALS — BP 126/60 | HR 87 | Ht 67.0 in | Wt 183.0 lb

## 2018-02-15 DIAGNOSIS — Q211 Atrial septal defect: Secondary | ICD-10-CM

## 2018-02-15 DIAGNOSIS — O99419 Diseases of the circulatory system complicating pregnancy, unspecified trimester: Secondary | ICD-10-CM

## 2018-02-15 DIAGNOSIS — Q249 Congenital malformation of heart, unspecified: Secondary | ICD-10-CM | POA: Diagnosis not present

## 2018-02-15 DIAGNOSIS — Q2111 Secundum atrial septal defect: Secondary | ICD-10-CM

## 2018-02-15 DIAGNOSIS — O99712 Diseases of the skin and subcutaneous tissue complicating pregnancy, second trimester: Principal | ICD-10-CM

## 2018-02-15 DIAGNOSIS — L299 Pruritus, unspecified: Secondary | ICD-10-CM

## 2018-02-15 NOTE — Patient Instructions (Signed)
Medication Instructions:  Your physician recommends that you continue on your current medications as directed. Please refer to the Current Medication list given to you today.   Labwork: NONE  Testing/Procedures: Your physician has requested that you have an echocardiogram. Echocardiography is a painless test that uses sound waves to create images of your heart. It provides your doctor with information about the size and shape of your heart and how well your heart's chambers and valves are working. This procedure takes approximately one hour. There are no restrictions for this procedure. You may get an IV, if needed, to receive an ultrasound enhancing agent through to better visualize your heart.    Follow-Up: Your physician recommends that you schedule a follow-up appointment WITH DR Mariah MillingGOLLAN AS NEEDED.    Echocardiogram An echocardiogram, or echocardiography, uses sound waves (ultrasound) to produce an image of your heart. The echocardiogram is simple, painless, obtained within a short period of time, and offers valuable information to your health care provider. The images from an echocardiogram can provide information such as:  Evidence of coronary artery disease (CAD).  Heart size.  Heart muscle function.  Heart valve function.  Aneurysm detection.  Evidence of a past heart attack.  Fluid buildup around the heart.  Heart muscle thickening.  Assess heart valve function.  Tell a health care provider about:  Any allergies you have.  All medicines you are taking, including vitamins, herbs, eye drops, creams, and over-the-counter medicines.  Any problems you or family members have had with anesthetic medicines.  Any blood disorders you have.  Any surgeries you have had.  Any medical conditions you have.  Whether you are pregnant or may be pregnant. What happens before the procedure? No special preparation is needed. Eat and drink normally. What happens during the  procedure?  In order to produce an image of your heart, gel will be applied to your chest and a wand-like tool (transducer) will be moved over your chest. The gel will help transmit the sound waves from the transducer. The sound waves will harmlessly bounce off your heart to allow the heart images to be captured in real-time motion. These images will then be recorded.  You may need an IV to receive a medicine that improves the quality of the pictures. What happens after the procedure? You may return to your normal schedule including diet, activities, and medicines, unless your health care provider tells you otherwise. This information is not intended to replace advice given to you by your health care provider. Make sure you discuss any questions you have with your health care provider. Document Released: 06/05/2000 Document Revised: 01/25/2016 Document Reviewed: 02/13/2013 Elsevier Interactive Patient Education  2017 [employer]

## 2018-02-15 NOTE — Progress Notes (Signed)
Office Visit    Patient Name: Vicki Adams Date of Encounter: 02/15/2018  Primary Care Provider:  Vena Austria, MD Primary Cardiologist:  Prev seen by A. Alvino Chapel, MD   Chief Complaint    22 year old female with a history of secundum ASD status post closure in 2002, who presents for follow-up in the setting of pregnancy and mild dyspnea on exertion.  Past Medical History    Past Medical History:  Diagnosis Date  . Anemia   . Secundum ASD    a. 11/2000 s/p closure @ [facility]; b. 04/2015 Echo: Nl LV/RV fxn. Mild PR, triv TR. Stable ASD occluder; b. 01/2016 Echo: EF 60-65%, no rwma.  . Syncope and collapse    a. With second pregnancy-->02/2016 Holter: sinus rhythm, elevated HR likely 2/2 pregnancy. No arrhythmias.   Past Surgical History:  Procedure Laterality Date  . CARDIAC SURGERY  11/2000  . HERNIA REPAIR  August 07, 1995   congential diaphragmatic hernia repair done at 28 weeks of age at [facility]    Allergies  Allergies  Allergen Reactions  . Erythromycin Anaphylaxis, Swelling and Nausea And Vomiting    History of Present Illness    22 year old female with the above past medical history including secundum ASD status post occluder at age 8.  She has been followed by cardiology periodically over the years and was last seen here in 2017 in the setting of her second pregnancy.  During that pregnancy, she experienced dyspnea on exertion as well as palpitations and presyncope/syncope.  Echocardiogram showed normal LV function.  Holter monitoring did not show any significant arrhythmias.  She presents today on the advice of her obstetrician.  She is 7 months pregnant and thus far, pregnancy has been relatively uneventful.  She has noticed dyspnea on exertion over the past few months.  Though she did experience presyncope within the first month of her pregnancy, she has not had any recurrence over the past 6 months.  She denies chest pain, palpitations, PND, orthopnea, dizziness, syncope,  or early satiety.  She does experience mild dependent lower extremity swelling.  Home Medications    Prior to Admission medications   Medication Sig Start Date End Date Taking? Authorizing Provider  acetaminophen (TYLENOL) 500 MG tablet Take 2 tablets by mouth as needed.   Yes [provider]  Prenat w/o A-[id number]-[id number]-DHA (CITRANATAL DHA) 27-1 & 250 MG tablet Take 1 tablet by mouth daily. 09/23/17  Yes Tresea Mall, CNM    Review of Systems    Dyspnea on exertion as outlined above.  Dependent edema.  She denies palpitations, chest pain, PND, orthopnea, dizziness, syncope, or early satiety.  All other systems reviewed and are otherwise negative except as noted above.  Physical Exam    VS:  BP 126/60 (BP Location: Left Arm, Patient Position: Sitting, Cuff Size: Normal)   Pulse 87   Ht 5\' 7"  (1.702 m)   Wt 183 lb (83 kg)   LMP 07/31/2017   BMI 28.66 kg/m  , BMI Body mass index is 28.66 kg/m. GEN: Well nourished, well developed, in no acute distress.  HEENT: normal.  Neck: Supple, no JVD, carotid bruits, or masses. Cardiac: RRR, no murmurs, rubs, or gallops. No clubbing, cyanosis, edema.  Radials/DP/PT 2+ and equal bilaterally.  Respiratory:  Respirations regular and unlabored, clear to auscultation bilaterally. GI: Soft, nontender, nondistended, BS + x 4. MS: no deformity or atrophy. Skin: warm and dry, no rash. Neuro:  Strength and sensation are intact. Psych: Normal affect.  Accessory  Clinical Findings    ECG -regular sinus rhythm, 87, no acute ST or T changes.  Assessment & Plan    1.  Secundum ASD status post occluder at age 89: Stable on echo in 2016 and 2017.  She is pregnant with her third child, currently at 7 months.  She has had some dyspnea on exertion but has otherwise been stable.  I will arrange for follow-up echocardiogram.  Provided that this is stable, no additional cardiac work-up required at this time.  2.  History of presyncope and syncope: This  occurred during her last pregnancy.  Holter monitoring did not show any significant arrhythmias.  Echo showed normal LV function.  Though she had some presyncope at least on one occasion early on in her pregnancy, she has not had any in the past 6 months.  3.  Disposition: Follow-up echo.  Follow-up with Dr. Mariah MillingGollan in one year or sooner if necessary.  Vicki Duckinghristopher Jerni Selmer, NP 02/15/2018, 12:01 PM

## 2018-02-17 ENCOUNTER — Encounter: Payer: Self-pay | Admitting: Physical Therapy

## 2018-02-17 ENCOUNTER — Other Ambulatory Visit: Payer: Self-pay | Admitting: Maternal Newborn

## 2018-02-17 ENCOUNTER — Other Ambulatory Visit: Payer: Self-pay

## 2018-02-17 ENCOUNTER — Ambulatory Visit: Payer: Medicaid Other | Attending: Maternal Newborn | Admitting: Physical Therapy

## 2018-02-17 VITALS — BP 100/60

## 2018-02-17 DIAGNOSIS — R2689 Other abnormalities of gait and mobility: Secondary | ICD-10-CM | POA: Insufficient documentation

## 2018-02-17 DIAGNOSIS — M533 Sacrococcygeal disorders, not elsewhere classified: Secondary | ICD-10-CM | POA: Insufficient documentation

## 2018-02-17 DIAGNOSIS — M6281 Muscle weakness (generalized): Secondary | ICD-10-CM | POA: Diagnosis present

## 2018-02-17 DIAGNOSIS — O99712 Diseases of the skin and subcutaneous tissue complicating pregnancy, second trimester: Principal | ICD-10-CM

## 2018-02-17 DIAGNOSIS — L299 Pruritus, unspecified: Secondary | ICD-10-CM

## 2018-02-17 LAB — COMPREHENSIVE METABOLIC PANEL
A/G RATIO: 1.1 — AB (ref 1.2–2.2)
ALT: 6 IU/L (ref 0–32)
AST: 12 IU/L (ref 0–40)
Albumin: 3.2 g/dL — ABNORMAL LOW (ref 3.5–5.5)
Alkaline Phosphatase: 64 IU/L (ref 39–117)
BILIRUBIN TOTAL: 0.2 mg/dL (ref 0.0–1.2)
BUN/Creatinine Ratio: 8 — ABNORMAL LOW (ref 9–23)
BUN: 5 mg/dL — ABNORMAL LOW (ref 6–20)
CHLORIDE: 103 mmol/L (ref 96–106)
CO2: 22 mmol/L (ref 20–29)
Calcium: 8.7 mg/dL (ref 8.7–10.2)
Creatinine, Ser: 0.63 mg/dL (ref 0.57–1.00)
GFR calc non Af Amer: 128 mL/min/{1.73_m2} (ref >59)
GFR, EST AFRICAN AMERICAN: 147 mL/min/{1.73_m2} (ref >59)
GLOBULIN, TOTAL: 2.8 g/dL (ref 1.5–4.5)
Glucose: 73 mg/dL (ref 65–99)
POTASSIUM: 4 mmol/L (ref 3.5–5.2)
Sodium: 138 mmol/L (ref 134–144)
TOTAL PROTEIN: 6 g/dL (ref 6.0–8.5)

## 2018-02-17 LAB — BILE ACIDS, TOTAL: BILE ACIDS TOTAL: 1.2 umol/L (ref 0.0–10.0)

## 2018-02-17 MED ORDER — HYDROXYZINE HCL 10 MG PO TABS: 10.0000 mg | ORAL_TABLET | Freq: Three times a day (TID) | ORAL | 0 refills | Status: DC | PRN

## 2018-02-17 NOTE — Progress Notes (Signed)
Rx for hydroxyzine for pruritus.

## 2018-02-17 NOTE — Patient Instructions (Addendum)
Seated with feet on the ground: Pillow squeezes 10 x 3 x 3 x day   Thigh out, press out in the the palms  10 x 3 x 3 x day    Inhale, exhale, pelvic floor squeeze 10 quick contractions  Morning, mid morning, lunch, mid afternoon,and dinner  5 x day   ________  Wear the new belt provided today   Wear tennis shoes with good sole support, not flip flops  ________   Proper body mechanics with getting out of a chair to decrease strain  on back &pelvic floor   Avoid holding your breath when Getting out of the chair:  Scoot to front part of chair chair Heels behind feet, feet are hip width apart, nose over toes  Inhale like you are smelling roses Exhale to stand    _________  Body mechanics training: Move legs with exhale, incrementally not opening thighs apart too wide in bed, press into feet and elbows before scooting hips  getting in/out of car  ( butt first into seat)  Log rolling into and out of .bed with sidelying position first

## 2018-02-18 NOTE — Therapy (Signed)
[place] [facility] [address] [place], [place], [address] Phone: [phone]   Fax:  [phone]  Physical Therapy Evaluation  Patient Details  Name: Vicki Adams MRN: [id number] Date of Birth: 10/02/1995 Referring Provider: Marcelyn BruinsJacelyn Schmid    Encounter Date: 02/17/2018  PT End of Session - 02/18/18 1622    Visit Number  1    Number of Visits  8    Date for PT Re-Evaluation  04/15/18    PT Start Time  0900    PT Stop Time  0955    PT Time Calculation (min)  55 min    Activity Tolerance  Patient tolerated treatment well;No increased pain    Behavior During Therapy  WFL for tasks assessed/performed       Past Medical History:  Diagnosis Date  . Anemia   . Secundum ASD    a. 11/2000 s/p closure @ [facility]; b. 04/2015 Echo: Nl LV/RV fxn. Mild PR, triv TR. Stable ASD occluder; b. 01/2016 Echo: EF 60-65%, no rwma.  . Syncope and collapse    a. With second pregnancy-->02/2016 Holter: sinus rhythm, elevated HR likely 2/2 pregnancy. No arrhythmias.    Past Surgical History:  Procedure Laterality Date  . CARDIAC SURGERY  11/2000  . HERNIA REPAIR  06/1995   congential diaphragmatic hernia repair done at 552 weeks of age at [facility]    Vitals:   02/17/18 0914  BP: 100/60     Subjective Assessment - 02/17/18 0912    Subjective  Pt started feeling pubic bone pain in her 2nd trimester of her 3rd pregancy. This pain hurts with walking and liffting the R leg up. Pt also has a pinching pain in the area around her buttocks. The pain is localized but her R leg does tingle sometimes. 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 Pt is due on 05/14/18.       Pertinent History  Cardiac surgery 2002, Previous pregnancies: vaginal devlieries with tearing with her first child,     Patient Stated Goals  get tips on h elping it not hurt          [facility] PT Assessment - 02/18/18 1619      Assessment   Medical  Diagnosis  pubic bone pain    Referring Provider  Theodoro KosJacklyn Schmid       Precautions   Precautions  None      Restrictions   Weight Bearing Restrictions  No      Balance Screen   Has the patient fallen in the past 6 months  No      Coordination   Gross Motor Movements are Fluid and Coordinated  --   dyscoordinatino of deep core      Sit to Stand   Comments  breathholding       AROM   Overall AROM Comments  Spinal [facility]      Strength   Overall Strength Comments  hip flex R 3/5 with pain pre Tx, post Tx 3+/5 no pain. L hip flexion/ B knee flexion/ ext  4/5       Palpation   Palpation comment  R SIJ mobility limited                Objective measurements completed on examination: See above findings.      [facility]ercise - 02/18/18 1619      Bed Mobility   Bed Mobility  --  poor technique with pain. post training, less pain     Ambulation/Gait   Gait Comments  0.85 m/s with R hip hike ( post Tx: .83 m/s with less hip hike on R)       Neuro Re-ed    Neuro Re-ed Details   see pt instruction       Manual Therapy   Manual therapy comments  long axis distraction BLE, FADDER on R Grade II               PT Education - 02/18/18 1622    Education Details  POC, anatomy, physiology, goals, HEP    Person(s) Educated  Patient    Methods  Explanation;Demonstration;Tactile cues;Verbal cues;Handout    Comprehension  Returned demonstration;Verbalized understanding          PT Long Term Goals - 02/17/18 0923      PT LONG TERM GOAL #1   Title  Pt will decrease her PGQ score from 41% to < 31% in order to perform ADLs and work    Baseline  41%     Time  8    Period  Weeks    Status  New    Target Date  04/15/18      PT LONG TERM GOAL #2   Title  Pt will demo increased hip strength with hip flexion from R 2/5 to 4+/5 and hip abd B from 3/5 to 4+/5 in order to increased pelvic stability for walking     Baseline  hip flexion from R 2/5 ,  B hip abd 3/5    Time  4    Period  Weeks    Status  New    Target Date  03/18/18      PT LONG TERM GOAL #3   Title  Pt will demo proper body mechanics with functional activities to minimize pain and to perferm her ADLs    Baseline  poor body mechanics, breathholding,     Time  8    Period  Weeks    Status  New    Target Date  04/15/18      PT LONG TERM GOAL #4   Title  Pt will demo no pelvic girdle malalignment and increased SIJ mobility across 2 visits in order to progress with strnegthening exercises    Baseline  R SIJ mobility limited    Time  2    Period  Weeks    Status  New    Target Date  03/04/18             Plan - 02/17/18 0958    Clinical Impression Statement  Pt is a 22 yo female who is in her 2nd trimester with her 3rd child and reports pubic bone pain and R sciatica. Pt's presentations includes pelvic malalignment, hip weakness/ deep core strength, and poor body mechanics. Following Tx today which included manual Tx to realignment her pelvic girdle, a SIJ belt, and strengthening exercises, pt reported she felt 5% improvement with her pain.  Pt wil benefit from skilled PT.   Clinical Presentation  Evolving    Clinical Decision Making  Moderate    PT Frequency  1x / week    PT Duration  8 weeks    PT Treatment/Interventions  Therapeutic activities;Patient/family education;Therapeutic exercise;Gait training;Moist Heat;Neuromuscular re-education;Stair training;Manual techniques;Scar mobilization;Manual lymph drainage;ADLs/Self Care Home Management;Functional mobility training    Consulted and Agree with Plan of Care  Patient       Patient will  benefit from skilled therapeutic intervention in order to improve the following deficits and impairments:  Improper body mechanics, Pain, Decreased coordination, Decreased mobility, Decreased scar mobility, Increased muscle spasms, Postural dysfunction, Decreased endurance, Decreased activity tolerance, Decreased range of  motion, Decreased strength, Decreased safety awareness, Hypomobility  Visit Diagnosis: Sacrococcygeal disorders, not elsewhere classified  Other abnormalities of gait and mobility  Muscle weakness (generalized)     Problem List Patient Active Problem List   Diagnosis Date Noted  . Abdominal pain during pregnancy in second trimester 01/18/2018  . History of postpartum depression, currently pregnant 10/19/2017  . Rh negative state in antepartum period 09/07/2017  . Supervision of other normal pregnancy, antepartum 09/07/2017  . Congenital heart disease, maternal, antepartum 01/23/2016  . History of percutaneous transcatheter closure of congenital ASD 01/24/2015  . History of esophageal hernia repair 01/24/2015    Mariane Masters ,PT, DPT, E-RYT  02/18/2018, 4:32 PM  [facility] [facility] MAIN [facility] SERVICES [address] [place], [place], [address] Phone: [phone]   Fax:  [phone]  Name: Vicki Adams MRN: [id number] Date of Birth: 07-09-1995

## 2018-02-22 ENCOUNTER — Encounter: Payer: Self-pay | Admitting: Maternal Newborn

## 2018-02-22 ENCOUNTER — Encounter

## 2018-02-22 ENCOUNTER — Ambulatory Visit (INDEPENDENT_AMBULATORY_CARE_PROVIDER_SITE_OTHER): Payer: Self-pay

## 2018-02-22 ENCOUNTER — Other Ambulatory Visit: Payer: Medicaid Other

## 2018-02-22 ENCOUNTER — Ambulatory Visit (INDEPENDENT_AMBULATORY_CARE_PROVIDER_SITE_OTHER): Payer: Self-pay | Admitting: Maternal Newborn

## 2018-02-22 VITALS — BP 100/60 | Wt 184.0 lb

## 2018-02-22 DIAGNOSIS — Z3A28 28 weeks gestation of pregnancy: Secondary | ICD-10-CM

## 2018-02-22 DIAGNOSIS — O26899 Other specified pregnancy related conditions, unspecified trimester: Secondary | ICD-10-CM

## 2018-02-22 DIAGNOSIS — Z3689 Encounter for other specified antenatal screening: Secondary | ICD-10-CM

## 2018-02-22 DIAGNOSIS — O360130 Maternal care for anti-D [Rh] antibodies, third trimester, not applicable or unspecified: Secondary | ICD-10-CM

## 2018-02-22 DIAGNOSIS — Z3480 Encounter for supervision of other normal pregnancy, unspecified trimester: Secondary | ICD-10-CM

## 2018-02-22 DIAGNOSIS — Z3483 Encounter for supervision of other normal pregnancy, third trimester: Secondary | ICD-10-CM

## 2018-02-22 DIAGNOSIS — Z6791 Unspecified blood type, Rh negative: Secondary | ICD-10-CM

## 2018-02-22 NOTE — Patient Instructions (Signed)
Third Trimester of Pregnancy The third trimester is from week 28 through week 40 (months 7 through 9). The third trimester is a time when the unborn baby (fetus) is growing rapidly. At the end of the ninth month, the fetus is about 20 inches in length and weighs 6-10 pounds. Body changes during your third trimester Your body will continue to go through many changes during pregnancy. The changes vary from woman to woman. During the third trimester:  Your weight will continue to increase. You can expect to gain 25-35 pounds (11-16 kg) by the end of the pregnancy.  You may begin to get stretch marks on your hips, abdomen, and breasts.  You may urinate more often because the fetus is moving lower into your pelvis and pressing on your bladder.  You may develop or continue to have heartburn. This is caused by increased hormones that slow down muscles in the digestive tract.  You may develop or continue to have constipation because increased hormones slow digestion and cause the muscles that push waste through your intestines to relax.  You may develop hemorrhoids. These are swollen veins (varicose veins) in the rectum that can itch or be painful.  You may develop swollen, bulging veins (varicose veins) in your legs.  You may have increased body aches in the pelvis, back, or thighs. This is due to weight gain and increased hormones that are relaxing your joints.  You may have changes in your hair. These can include thickening of your hair, rapid growth, and changes in texture. Some women also have hair loss during or after pregnancy, or hair that feels dry or thin. Your hair will most likely return to normal after your baby is born.  Your breasts will continue to grow and they will continue to become tender. A yellow fluid (colostrum) may leak from your breasts. This is the first milk you are producing for your baby.  Your belly button may stick out.  You may notice more swelling in your hands,  face, or ankles.  You may have increased tingling or numbness in your hands, arms, and legs. The skin on your belly may also feel numb.  You may feel short of breath because of your expanding uterus.  You may have more problems sleeping. This can be caused by the size of your belly, increased need to urinate, and an increase in your body's metabolism.  You may notice the fetus "dropping," or moving lower in your abdomen (lightening).  You may have increased vaginal discharge.  You may notice your joints feel loose and you may have pain around your pelvic bone.  What to expect at prenatal visits You will have prenatal exams every 2 weeks until week 36. Then you will have weekly prenatal exams. During a routine prenatal visit:  You will be weighed to make sure you and the baby are growing normally.  Your blood pressure will be taken.  Your abdomen will be measured to track your baby's growth.  The fetal heartbeat will be listened to.  Any test results from the previous visit will be discussed.  You may have a cervical check near your due date to see if your cervix has softened or thinned (effaced).  You will be tested for Group B streptococcus. This happens between 35 and 37 weeks.  Your health care provider may ask you:  What your birth plan is.  How you are feeling.  If you are feeling the baby move.  If you have had   any abnormal symptoms, such as leaking fluid, bleeding, severe headaches, or abdominal cramping.  If you are using any tobacco products, including cigarettes, chewing tobacco, and electronic cigarettes.  If you have any questions.  Other tests or screenings that may be performed during your third trimester include:  Blood tests that check for low iron levels (anemia).  Fetal testing to check the health, activity level, and growth of the fetus. Testing is done if you have certain medical conditions or if there are problems during the  pregnancy.  Nonstress test (NST). This test checks the health of your baby to make sure there are no signs of problems, such as the baby not getting enough oxygen. During this test, a belt is placed around your belly. The baby is made to move, and its heart rate is monitored during movement.  What is false labor? False labor is a condition in which you feel small, irregular tightenings of the muscles in the womb (contractions) that usually go away with rest, changing position, or drinking water. These are called Braxton Hicks contractions. Contractions may last for hours, days, or even weeks before true labor sets in. If contractions come at regular intervals, become more frequent, increase in intensity, or become painful, you should see your health care provider. What are the signs of labor?  Abdominal cramps.  Regular contractions that start at 10 minutes apart and become stronger and more frequent with time.  Contractions that start on the top of the uterus and spread down to the lower abdomen and back.  Increased pelvic pressure and dull back pain.  A watery or bloody mucus discharge that comes from the vagina.  Leaking of amniotic fluid. This is also known as your "water breaking." It could be a slow trickle or a gush. Let your health care provider know if it has a color or strange odor. If you have any of these signs, call your health care provider right away, even if it is before your due date. Follow these instructions at home: Medicines  Follow your health care provider's instructions regarding medicine use. Specific medicines may be either safe or unsafe to take during pregnancy.  Take a prenatal vitamin that contains at least 600 micrograms (mcg) of folic acid.  If you develop constipation, try taking a stool softener if your health care provider approves. Eating and drinking  Eat a balanced diet that includes fresh fruits and vegetables, whole grains, good sources of protein  such as meat, eggs, or tofu, and low-fat dairy. Your health care provider will help you determine the amount of weight gain that is right for you.  Avoid raw meat and uncooked cheese. These carry germs that can cause birth defects in the baby.  If you have low calcium intake from food, talk to your health care provider about whether you should take a daily calcium supplement.  Eat four or five small meals rather than three large meals a day.  Limit foods that are high in fat and processed sugars, such as fried and sweet foods.  To prevent constipation: ? Drink enough fluid to keep your urine clear or pale yellow. ? Eat foods that are high in fiber, such as fresh fruits and vegetables, whole grains, and beans. Activity  Exercise only as directed by your health care provider. Most women can continue their usual exercise routine during pregnancy. Try to exercise for 30 minutes at least 5 days a week. Stop exercising if you experience uterine contractions.  Avoid heavy   lifting.  Do not exercise in extreme heat or humidity, or at high altitudes.  Wear low-heel, comfortable shoes.  Practice good posture.  You may continue to have sex unless your health care provider tells you otherwise. Relieving pain and discomfort  Take frequent breaks and rest with your legs elevated if you have leg cramps or low back pain.  Take warm sitz baths to soothe any pain or discomfort caused by hemorrhoids. Use hemorrhoid cream if your health care provider approves.  Wear a good support bra to prevent discomfort from breast tenderness.  If you develop varicose veins: ? Wear support pantyhose or compression stockings as told by your healthcare provider. ? Elevate your feet for 15 minutes, 3-4 times a day. Prenatal care  Write down your questions. Take them to your prenatal visits.  Keep all your prenatal visits as told by your health care provider. This is important. Safety  Wear your seat belt at  all times when driving.  Make a list of emergency phone numbers, including numbers for family, friends, the hospital, and police and fire departments. General instructions  Avoid cat litter boxes and soil used by cats. These carry germs that can cause birth defects in the baby. If you have a cat, ask someone to clean the litter box for you.  Do not travel far distances unless it is absolutely necessary and only with the approval of your health care provider.  Do not use hot tubs, steam rooms, or saunas.  Do not drink alcohol.  Do not use any products that contain nicotine or tobacco, such as cigarettes and e-cigarettes. If you need help quitting, ask your health care provider.  Do not use any medicinal herbs or unprescribed drugs. These chemicals affect the formation and growth of the baby.  Do not douche or use tampons or scented sanitary pads.  Do not cross your legs for long periods of time.  To prepare for the arrival of your baby: ? Take prenatal classes to understand, practice, and ask questions about labor and delivery. ? Make a trial run to the hospital. ? Visit the hospital and tour the maternity area. ? Arrange for maternity or paternity leave through employers. ? Arrange for family and friends to take care of pets while you are in the hospital. ? Purchase a rear-facing car seat and make sure you know how to install it in your car. ? Pack your hospital bag. ? Prepare the baby's nursery. Make sure to remove all pillows and stuffed animals from the baby's crib to prevent suffocation.  Visit your dentist if you have not gone during your pregnancy. Use a soft toothbrush to brush your teeth and be gentle when you floss. Contact a health care provider if:  You are unsure if you are in labor or if your water has broken.  You become dizzy.  You have mild pelvic cramps, pelvic pressure, or nagging pain in your abdominal area.  You have lower back pain.  You have persistent  nausea, vomiting, or diarrhea.  You have an unusual or bad smelling vaginal discharge.  You have pain when you urinate. Get help right away if:  Your water breaks before 37 weeks.  You have regular contractions less than 5 minutes apart before 37 weeks.  You have a fever.  You are leaking fluid from your vagina.  You have spotting or bleeding from your vagina.  You have severe abdominal pain or cramping.  You have rapid weight loss or weight gain.    You have shortness of breath with chest pain.  You notice sudden or extreme swelling of your face, hands, ankles, feet, or legs.  Your baby makes fewer than 10 movements in 2 hours.  You have severe headaches that do not go away when you take medicine.  You have vision changes. Summary  The third trimester is from week 28 through week 40, months 7 through 9. The third trimester is a time when the unborn baby (fetus) is growing rapidly.  During the third trimester, your discomfort may increase as you and your baby continue to gain weight. You may have abdominal, leg, and back pain, sleeping problems, and an increased need to urinate.  During the third trimester your breasts will keep growing and they will continue to become tender. A yellow fluid (colostrum) may leak from your breasts. This is the first milk you are producing for your baby.  False labor is a condition in which you feel small, irregular tightenings of the muscles in the womb (contractions) that eventually go away. These are called Braxton Hicks contractions. Contractions may last for hours, days, or even weeks before true labor sets in.  Signs of labor can include: abdominal cramps; regular contractions that start at 10 minutes apart and become stronger and more frequent with time; watery or bloody mucus discharge that comes from the vagina; increased pelvic pressure and dull back pain; and leaking of amniotic fluid. This information is not intended to replace advice  given to you by your health care provider. Make sure you discuss any questions you have with your health care provider. Document Released: 06/02/2001 Document Revised: 11/14/2015 Document Reviewed: 08/09/2012 Elsevier Interactive Patient Education  2017 [employer]  

## 2018-02-22 NOTE — Progress Notes (Signed)
Routine Prenatal Care Visit  Subjective  Vicki Adams is a 22 y.o. G3P2002 at [redacted]w[redacted]d being seen today for ongoing prenatal care.  She is currently monitored for the following issues for this low-risk pregnancy and has History of percutaneous transcatheter closure of congenital ASD; History of esophageal hernia repair; Congenital heart disease, maternal, antepartum; Rh negative state in antepartum period; Supervision of other normal pregnancy, antepartum; History of postpartum depression, currently pregnant; and Abdominal pain during pregnancy in second trimester on their problem list.  ----------------------------------------------------------------------------------- Patient reports that itching has not improved. Not much relief with topical creams and antihistamines. Also has nasal congestion and swollen eyes since last night. Occasional mild cramping that resolves on its own. Contractions: Not present. Vag. Bleeding: None.  Movement: Present. No leaking of fluid.  ----------------------------------------------------------------------------------- The following portions of the patient's history were reviewed and updated as appropriate: allergies, current medications, past family history, past medical history, past social history, past surgical history and problem list. Problem list updated.   Objective  Blood pressure 100/60, weight 184 lb (83.5 kg), last menstrual period 07/31/2017. Pregravid weight 180 lb (81.6 kg) Total Weight Gain 4 lb (1.814 kg) Body mass index is 28.82 kg/m.  Fetal Status: Fetal Heart Rate (bpm): 153 Fundal Height: 28 cm Movement: Present     General:  Alert, oriented and cooperative. Patient is in no acute distress.  Skin: Skin is warm and dry. No rash noted.   Cardiovascular: Normal heart rate noted  Respiratory: Normal respiratory effort, no problems with respiration noted  Abdomen: Soft, gravid, appropriate for gestational age. Pain/Pressure: Absent      Pelvic:  Cervical exam deferred        Extremities: Normal range of motion.     Mental Status: Normal mood and affect. Normal behavior. Normal judgment and thought content.     Assessment   22 y.o. [id number] at [redacted]w[redacted]d, EDD 05/14/2018 by Ultrasound presenting for a routine prenatal visit.  Plan   THIRD Problems (from 09/07/17 to present)    Problem Noted Resolved   Rh negative state in antepartum period 09/07/2017 by Oswaldo Conroy, CNM No   Overview Signed 09/21/2017  4:27 PM by Vena Austria, MD    [ ]  rhogam 28 weeks      Supervision of other normal pregnancy, antepartum 09/07/2017 by Oswaldo Conroy, CNM No   Overview Addendum 02/05/2018  5:07 PM by Oswaldo Conroy, CNM    Clinic [facility] Prenatal Labs  Dating 6 week [place] Blood type: O/Negative/-- (03/19 1511)   Genetic Screen NIPS: Negative XX     Carrier testing Fragile X, SMA, CF: Antibody:Negative (03/19 1511)  Anatomic [place] Complete 7/16; normal Rubella: 1.65 (03/19 1511) Varicella: Non-immune  GTT Early:               Third trimester:  RPR: Non Reactive (03/19 1511)   Rhogam [ x] 28 weeks HBsAg: Negative (03/19 1511)   TDaP vaccine                       HIV: Non Reactive (03/19 1511)   Baby Food                                GBS:   Contraception Nexplanon Pap: 09/07/2017 NILM  CBB   Maternal history of ASD [x]  fetal echo at 20-24 weeks  CS/VBAC    Support Person  Congenital heart disease, maternal, antepartum 01/23/2016 by Grotegut, [place], MD No   Overview Addendum 01/23/2018  9:22 PM by Farrel Conners, CNM    Hx of ASD repair at age 61 Had a normal echocardiogram 01/2016 which was normal [x ] Fetal Echo at 20-24 weeks-01/10/2018 [facility] Cardiology consult ( ) is another echo needed?       Growth scan today 52nd percentile. EFW 2 lb 11 oz, AFI 12.84 cm.  RhoGAM today.  May continue antihistamines as needed, can switch to Claritin or Benadryl for allergy/cold symptoms.   General obstetric precautions  were reviewed.  Please refer to After Visit Summary for other counseling recommendations.   Return in about 2 weeks (around 03/08/2018) for ROB.  Marcelyn Bruins, CNM 02/22/2018  10:26 AM

## 2018-02-23 LAB — 28 WEEKS RH-PANEL
ANTIBODY SCREEN: NEGATIVE
BASOS: 0 %
Basophils Absolute: 0 10*3/uL (ref 0.0–0.2)
EOS (ABSOLUTE): 0.1 10*3/uL (ref 0.0–0.4)
EOS: 2 %
Gestational Diabetes Screen: 100 mg/dL (ref 65–139)
HIV SCREEN 4TH GENERATION: NONREACTIVE
Hematocrit: 29 % — ABNORMAL LOW (ref 34.0–46.6)
Hemoglobin: 9.3 g/dL — ABNORMAL LOW (ref 11.1–15.9)
IMMATURE GRANS (ABS): 0 10*3/uL (ref 0.0–0.1)
Immature Granulocytes: 0 %
LYMPHS: 22 %
Lymphocytes Absolute: 1.5 10*3/uL (ref 0.7–3.1)
MCH: 24.7 pg — ABNORMAL LOW (ref 26.6–33.0)
MCHC: 32.1 g/dL (ref 31.5–35.7)
MCV: 77 fL — AB (ref 79–97)
MONOCYTES: 8 %
MONOS ABS: 0.6 10*3/uL (ref 0.1–0.9)
Neutrophils Absolute: 4.6 10*3/uL (ref 1.4–7.0)
Neutrophils: 68 %
Platelets: 187 10*3/uL (ref 150–450)
RBC: 3.76 x10E6/uL — ABNORMAL LOW (ref 3.77–5.28)
RDW: 16.5 % — ABNORMAL HIGH (ref 12.3–15.4)
RPR Ser Ql: NONREACTIVE
WBC: 6.9 10*3/uL (ref 3.4–10.8)

## 2018-03-02 ENCOUNTER — Other Ambulatory Visit: Payer: Self-pay | Admitting: Maternal Newborn

## 2018-03-02 DIAGNOSIS — Z3480 Encounter for supervision of other normal pregnancy, unspecified trimester: Secondary | ICD-10-CM

## 2018-03-02 DIAGNOSIS — O99019 Anemia complicating pregnancy, unspecified trimester: Secondary | ICD-10-CM

## 2018-03-02 MED ORDER — FERROUS SULFATE 325 (65 FE) MG PO TABS: 325.0000 mg | ORAL_TABLET | Freq: Two times a day (BID) | ORAL | 4 refills | Status: DC

## 2018-03-02 NOTE — Progress Notes (Signed)
Rx sent for iron.

## 2018-03-07 ENCOUNTER — Ambulatory Visit (INDEPENDENT_AMBULATORY_CARE_PROVIDER_SITE_OTHER): Payer: Self-pay

## 2018-03-07 ENCOUNTER — Other Ambulatory Visit: Payer: Self-pay

## 2018-03-07 DIAGNOSIS — O99419 Diseases of the circulatory system complicating pregnancy, unspecified trimester: Secondary | ICD-10-CM

## 2018-03-07 DIAGNOSIS — Q249 Congenital malformation of heart, unspecified: Secondary | ICD-10-CM

## 2018-03-08 ENCOUNTER — Encounter: Payer: Self-pay | Admitting: Obstetrics and Gynecology

## 2018-03-08 ENCOUNTER — Ambulatory Visit (INDEPENDENT_AMBULATORY_CARE_PROVIDER_SITE_OTHER): Payer: Medicaid Other | Admitting: Obstetrics and Gynecology

## 2018-03-08 ENCOUNTER — Telehealth: Payer: Self-pay | Admitting: *Deleted

## 2018-03-08 VITALS — BP 102/70 | Wt 183.0 lb

## 2018-03-08 DIAGNOSIS — O360130 Maternal care for anti-D [Rh] antibodies, third trimester, not applicable or unspecified: Secondary | ICD-10-CM

## 2018-03-08 DIAGNOSIS — Z23 Encounter for immunization: Secondary | ICD-10-CM

## 2018-03-08 DIAGNOSIS — O99019 Anemia complicating pregnancy, unspecified trimester: Secondary | ICD-10-CM | POA: Insufficient documentation

## 2018-03-08 DIAGNOSIS — Z3480 Encounter for supervision of other normal pregnancy, unspecified trimester: Secondary | ICD-10-CM

## 2018-03-08 DIAGNOSIS — Z3A30 30 weeks gestation of pregnancy: Secondary | ICD-10-CM

## 2018-03-08 DIAGNOSIS — O99013 Anemia complicating pregnancy, third trimester: Secondary | ICD-10-CM

## 2018-03-08 LAB — POCT URINALYSIS DIPSTICK OB: Glucose, UA: NEGATIVE

## 2018-03-08 NOTE — Progress Notes (Signed)
Routine Prenatal Care Visit  Subjective  Vicki Adams is a 22 y.o. G3P2002 at [redacted]w[redacted]d being seen today for ongoing prenatal care.  She is currently monitored for the following issues for this low-risk pregnancy and has History of percutaneous transcatheter closure of congenital ASD; History of esophageal hernia repair; Congenital heart disease, maternal, antepartum; Rh negative state in antepartum period; Supervision of other normal pregnancy, antepartum; History of postpartum depression, currently pregnant; Abdominal pain during pregnancy in second trimester; and Antepartum anemia on their problem list.  ----------------------------------------------------------------------------------- Patient reports no complaints.   Contractions: Irritability. Vag. Bleeding: Scant.  Movement: Present. Denies leaking of fluid.  ----------------------------------------------------------------------------------- The following portions of the patient's history were reviewed and updated as appropriate: allergies, current medications, past family history, past medical history, past social history, past surgical history and problem list. Problem list updated.   Objective  Blood pressure 102/70, weight 183 lb (83 kg), last menstrual period 07/31/2017, unknown if currently breastfeeding. Pregravid weight 180 lb (81.6 kg) Total Weight Gain 3 lb (1.361 kg) Urinalysis:      Fetal Status: Fetal Heart Rate (bpm): 150 Fundal Height: 30 cm Movement: Present     General:  Alert, oriented and cooperative. Patient is in no acute distress.  Skin: Skin is warm and dry. No rash noted.   Cardiovascular: Normal heart rate noted  Respiratory: Normal respiratory effort, no problems with respiration noted  Abdomen: Soft, gravid, appropriate for gestational age. Pain/Pressure: Present     Pelvic:  Cervical exam deferred Dilation: Closed Effacement (%): 0 Station: -3  Extremities: Normal range of motion.     ental  Status: Normal mood and affect. Normal behavior. Normal judgment and thought content.     Assessment   22 y.o. [id number] at [redacted]w[redacted]d by  05/14/2018, by Ultrasound presenting for routine prenatal visit  Plan   THIRD Problems (from 09/07/17 to present)    Problem Noted Resolved   Antepartum anemia 03/08/2018 by Natale Milch, MD No   Overview Signed 03/08/2018  5:08 PM by Natale Milch, MD    [ ]  recheck at 36 weeks, refr to hematology if there is not an improvement.       Rh negative state in antepartum period 09/07/2017 by Oswaldo Conroy, CNM No   Overview Signed 09/21/2017  4:27 PM by Vena Austria, MD    [ ]  rhogam 28 weeks      Supervision of other normal pregnancy, antepartum 09/07/2017 by Oswaldo Conroy, CNM No   Overview Addendum 03/08/2018  5:07 PM by Natale Milch, MD    Clinic [facility] Prenatal Labs  Dating 6 week [place] Blood type: O/Negative/-- (03/19 1511)   Genetic Screen NIPS: Negative XX     Carrier testing Fragile X, SMA, CF: Antibody:Negative (03/19 1511)  Anatomic [place] Complete 7/16; normal Rubella: 1.65 (03/19 1511) Varicella: Non-immune  GTT Third trimester: 100 RPR: Non Reactive (03/19 1511)   Rhogam [x]  28 weeks HBsAg: Negative (03/19 1511)   TDaP vaccine    03/08/18                   HIV: Non Reactive (03/19 1511)   Baby Food   bottle                             GBS:   Contraception Nexplanon Pap: 09/07/2017 NILM  CBB   Maternal history of ASD [x]  fetal echo at 20-24 weeks  CS/VBAC    Support Person              Congenital heart disease, maternal, antepartum 01/23/2016 by Grotegut, [place], MD No   Overview Addendum 01/23/2018  9:22 PM by Farrel ConnersGutierrez, Colleen, CNM    Hx of ASD repair at age 89 Had a normal echocardiogram 01/2016 which was normal [x ] [facility]etal [facility] 20-24 weeks[facility] is another [facility] needed?          Gestational age appropriate obstetric precautions including but not limited to vaginal bleeding,  contractions, leaking of fluid and fetal movement were reviewed in detail with the patient.    Discussed anemia- she has not been regularly taking iron twice a day. Discussed the importance for her health, the pregnancy, and preventing transfusion. She does not want a referral to hematology at this time. She will start taking oral iron twice a day. Will recheck labs at 36 weeks.   SVE today external os dilated, but internal os closed. She had a small amount of blood with examination.   Return in about 2 weeks (around 03/22/2018) for ROB.  Adelene Idlerhristanna Cathie Bonnell MD [facility], [facility] 03/08/18 5:08 PM

## 2018-03-08 NOTE — Telephone Encounter (Signed)
-----   Message from Creig Hineshristopher Ronald Berge, NP sent at 03/07/2018  4:16 PM EDT ----- Stable echocardiogram showing normal heart squeezing function and normally functioning ASD closure device without shunting.  Good news!

## 2018-03-08 NOTE — Telephone Encounter (Signed)
No answer. Left detail message with results, ok per DPR, and to call back if any questions.  

## 2018-03-08 NOTE — Progress Notes (Signed)
ROB C/o pelvic pressure, some braxton hicks  Desires cervical check

## 2018-03-10 ENCOUNTER — Observation Stay
Admission: EM | Admit: 2018-03-10 | Discharge: 2018-03-10 | Disposition: A | Discharge status: Home / Self Care | Payer: Medicaid Other | Attending: Obstetrics and Gynecology | Admitting: Obstetrics and Gynecology

## 2018-03-10 DIAGNOSIS — Z3A30 30 weeks gestation of pregnancy: Secondary | ICD-10-CM | POA: Diagnosis not present

## 2018-03-10 DIAGNOSIS — Z881 Allergy status to other antibiotic agents status: Secondary | ICD-10-CM | POA: Diagnosis not present

## 2018-03-10 DIAGNOSIS — Z3480 Encounter for supervision of other normal pregnancy, unspecified trimester: Secondary | ICD-10-CM

## 2018-03-10 DIAGNOSIS — Z6791 Unspecified blood type, Rh negative: Secondary | ICD-10-CM

## 2018-03-10 DIAGNOSIS — O368130 Decreased fetal movements, third trimester, not applicable or unspecified: Principal | ICD-10-CM | POA: Insufficient documentation

## 2018-03-10 DIAGNOSIS — O26899 Other specified pregnancy related conditions, unspecified trimester: Secondary | ICD-10-CM

## 2018-03-10 DIAGNOSIS — O99019 Anemia complicating pregnancy, unspecified trimester: Secondary | ICD-10-CM

## 2018-03-10 DIAGNOSIS — O99419 Diseases of the circulatory system complicating pregnancy, unspecified trimester: Secondary | ICD-10-CM

## 2018-03-10 DIAGNOSIS — Q249 Congenital malformation of heart, unspecified: Secondary | ICD-10-CM

## 2018-03-10 NOTE — OB Triage Note (Signed)
Patient came in for observation for decreased fetal movement since this morning around 0800. Patient denies uterine contractions at this time. Patient reports lower abdominal and back pain 1/10 that started this morning at 0800. Patient denies leaking of fluid, denies vaginal bleeding and spotting. Vital signs stable and patient afebrile. FHR baseline 145 with moderate variability with accelerations 15 x 15 and no decelerations. Will continue to monitor.

## 2018-03-10 NOTE — Discharge Summary (Signed)
See final progress note. 

## 2018-03-10 NOTE — Discharge Summary (Signed)
Patient discharged with instructions on fetal kick counts, follow up appointments, preterm labor precautions, and when to seek medical attention. Patient ambulatory at discharge with steady gait and no complaints. Patient verbalized understanding of discharge instructions. Patient reports + FM at discharge.

## 2018-03-10 NOTE — Final Progress Note (Signed)
Physician Final Progress Note  Patient ID: Vicki Adams MRN: [id number] DOB/AGE: 22/12/1995 22 y.o.  Admit date: 03/10/2018 Admitting provider: Vena AustriaAndreas Dondra Rhett, MD Discharge date: 03/10/2018  22 y.o. G3P2002 at 5424w5d presenting with decreased fetal movement.  Reactive tracing on L&D with 3 movements felt in 30 minutes on tracing.  No LOF, no VB, no ctx  THIRD Problems (from 09/07/17 to present)    Problem Noted Resolved   Antepartum anemia 03/08/2018 by Natale MilchSchuman, Christanna R, MD No   Overview Signed 03/08/2018  5:08 PM by Natale MilchSchuman, Christanna R, MD    [ ]  recheck at 36 weeks, refr to hematology if there is not an improvement.       Rh negative state in antepartum period 09/07/2017 by Oswaldo ConroySchmid, Jacelyn Y, CNM No   Overview Signed 09/21/2017  4:27 PM by Vena AustriaStaebler, Danelly Hassinger, MD    [ ]  rhogam 28 weeks      Supervision of other normal pregnancy, antepartum 09/07/2017 by Oswaldo ConroySchmid, Jacelyn Y, CNM No   Overview Addendum 03/08/2018  5:07 PM by Natale MilchSchuman, Christanna R, MD    [facility] Prenatal Labs  Dating 6 week US Blood type: O/Negative/-- (03/19 1511)   Genetic Screen NIPS: Negative XX     Carrier testing Fragile X, SMA, CF: Antibody:Negative (03/19 1511)  Anatomic US Complete 7/16; normal Rubella: 1.65 (03/19 1511) Varicella: Non-immune  GTT Third trimester: 100 RPR: Non Reactive (03/19 1511)   Rhogam [x]  28 weeks HBsAg: Negative (03/19 1511)   TDaP vaccine    03/08/18                   HIV: Non Reactive (03/19 1511)   Baby Food   bottle                             GBS:   Contraception Nexplanon Pap: 09/07/2017 NILM  CBB   Maternal history of ASD [x]  fetal echo at 20-24 weeks  CS/VBAC    Support Person              Congenital heart disease, maternal, antepartum 01/23/2016 by Grotegut, [place]had, MD No   Overview Addendum 01/23/2018  9:22 PM by Farrel ConnersGutierrez, Colleen, CNM    Hx of ASD repair at age 75 Had a normal echocardiogram 01/2016 which was normal [x ] Fetal Echo at 20-24 weeks-01/10/2018  [facility] is another echo needed?         Blood pressure 115/68, pulse 95, temperature 98.1 F (36.7 C), temperature source Oral, resp. rate 19, height 5\' 7"  (1.702 m), weight 83 kg, last menstrual period 07/31/2017, unknown if currently breastfeeding.   Admission Diagnoses: Decreased fetal movement  Discharge Diagnoses:  Active Problems:   Labor and delivery indication for care or intervention  Reactive NST  Consults: None  Significant Findings/ Diagnostic Studies: none  Procedures:  Baseline:145 Variability: moderate Accelerations: present Decelerations: absent Tocometry: none The patient was monitored for 30 minutes, fetal heart rate tracing was deemed reactive, category I tracing,    Discharge Condition: good  Disposition: Discharge disposition: 01-Home or Self Care       Diet: Regular diet  Discharge Activity: Activity as tolerated  Discharge Instructions    Discharge activity:  No Restrictions   Complete by:  As directed    Discharge diet:  No restrictions   Complete by:  As directed    No sexual activity restrictions   Complete by:  As directed  Notify physician for a general feeling that "something is not right"   Complete by:  As directed    Notify physician for increase or change in vaginal discharge   Complete by:  As directed    Notify physician for intestinal cramps, with or without diarrhea, sometimes described as "gas pain"   Complete by:  As directed    Notify physician for leaking of fluid   Complete by:  As directed    Notify physician for low, dull backache, unrelieved by heat or Tylenol   Complete by:  As directed    Notify physician for menstrual like cramps   Complete by:  As directed    Notify physician for pelvic pressure   Complete by:  As directed    Notify physician for uterine contractions.  These may be painless and feel like the uterus is tightening or the baby is  "balling up"   Complete by:  As  directed    Notify physician for vaginal bleeding   Complete by:  As directed    PRETERM LABOR:  Includes any of the follwing symptoms that occur between 20 - [redacted] weeks gestation.  If these symptoms are not stopped, preterm labor can result in preterm delivery, placing your baby at risk   Complete by:  As directed      Allergies as of 03/10/2018      Reactions   Erythromycin Anaphylaxis, Swelling, Nausea And Vomiting      Medication List    STOP taking these medications   hydrOXYzine 10 MG tablet Commonly known as:  ATARAX/VISTARIL     TAKE these medications   acetaminophen 500 MG tablet Commonly known as:  TYLENOL Take 2 tablets by mouth as needed.   CITRANATAL DHA 27-1 & 250 MG tablet Take 1 tablet by mouth daily.   ferrous sulfate 325 (65 FE) MG tablet Take 1 tablet (325 mg total) by mouth 2 (two) times daily with a meal.        Total time spent taking care of this patient: tracing reviewed patient triaged remotely Signed: Vena Austria 03/10/2018, 9:24 PM

## 2018-03-22 ENCOUNTER — Encounter: Payer: Self-pay | Admitting: Advanced Practice Midwife

## 2018-03-22 ENCOUNTER — Ambulatory Visit (INDEPENDENT_AMBULATORY_CARE_PROVIDER_SITE_OTHER): Payer: Medicaid Other | Admitting: Advanced Practice Midwife

## 2018-03-22 VITALS — BP 108/62 | Wt 186.0 lb

## 2018-03-22 DIAGNOSIS — O99013 Anemia complicating pregnancy, third trimester: Secondary | ICD-10-CM | POA: Diagnosis not present

## 2018-03-22 DIAGNOSIS — Z3A32 32 weeks gestation of pregnancy: Secondary | ICD-10-CM

## 2018-03-22 DIAGNOSIS — O360130 Maternal care for anti-D [Rh] antibodies, third trimester, not applicable or unspecified: Secondary | ICD-10-CM

## 2018-03-22 LAB — POCT URINALYSIS DIPSTICK OB
GLUCOSE, UA: NEGATIVE
POC,PROTEIN,UA: NEGATIVE

## 2018-03-22 LAB — POCT HEMOGLOBIN: Hemoglobin: 10.6 g/dL — AB (ref 12.2–16.2)

## 2018-03-22 NOTE — Progress Notes (Signed)
Routine Prenatal Care Visit  Subjective  Vicki Adams is a 22 y.o. G3P2002 at [redacted]w[redacted]d being seen today for ongoing prenatal care.  She is currently monitored for the following issues for this high-risk pregnancy and has History of percutaneous transcatheter closure of congenital ASD; History of esophageal hernia repair; Congenital heart disease, maternal, antepartum; Rh negative state in antepartum period; Supervision of other normal pregnancy, antepartum; History of postpartum depression, currently pregnant; Abdominal pain during pregnancy in second trimester; Antepartum anemia; and Labor and delivery indication for care or intervention on their problem list.  ----------------------------------------------------------------------------------- Patient reports fatigue and occasional contractions.  Encouraged increased hydration, continue with Fe supplement and eat Fe rich foods. Contractions: Irregular. Vag. Bleeding: None.  Movement: Present. Denies leaking of fluid.  ----------------------------------------------------------------------------------- The following portions of the patient's history were reviewed and updated as appropriate: allergies, current medications, past family history, past medical history, past social history, past surgical history and problem list. Problem list updated.   Objective  Blood pressure 108/62, weight 186 lb (84.4 kg), last menstrual period 07/31/2017 Pregravid weight 180 lb (81.6 kg) Total Weight Gain 6 lb (2.722 kg) Urinalysis: Urine Protein Negative  Urine Glucose Negative  Finger stick hemoglobin: 10.6 today  Fetal Status: Fetal Heart Rate (bpm): 140 Fundal Height: 32 cm Movement: Present     General:  Alert, oriented and cooperative. Patient is in no acute distress.  Skin: Skin is warm and dry. No rash noted.   Cardiovascular: Normal heart rate noted  Respiratory: Normal respiratory effort, no problems with respiration noted  Abdomen: Soft,  gravid, appropriate for gestational age. Pain/Pressure: Present     Pelvic:  Cervical exam deferred        Extremities: Normal range of motion.  Edema: None  Mental Status: Normal mood and affect. Normal behavior. Normal judgment and thought content.   Assessment   22 y.o. [id number] at [redacted]w[redacted]d by  05/14/2018, by Ultrasound presenting for routine prenatal visit  Plan   THIRD Problems (from 09/07/17 to present)    Problem Noted Resolved   Antepartum anemia 03/08/2018 by Vicki Milch, MD No   Overview Signed 03/08/2018  5:08 PM by Vicki Milch, MD    [ ]  recheck at 36 weeks, refr to hematology if there is not an improvement.       Rh negative state in antepartum period 09/07/2017 by Vicki Adams, CNM No   Overview Signed 09/21/2017  4:27 PM by Vicki Austria, MD    [ ]  rhogam 28 weeks      Supervision of other normal pregnancy, antepartum 09/07/2017 by Vicki Adams, CNM No   Overview Addendum 03/08/2018  5:07 PM by Vicki Milch, MD    [facility] Prenatal Labs  Dating 6 week [place] Blood type: O/Negative/-- (03/19 1511)   Genetic Screen NIPS: Negative XX     Carrier testing Fragile X, SMA, CF: Antibody:Negative (03/19 1511)  Anatomic [place] Complete 7/16; normal Rubella: 1.65 (03/19 1511) Varicella: Non-immune  GTT Third trimester: 100 RPR: Non Reactive (03/19 1511)   Rhogam [x]  28 weeks HBsAg: Negative (03/19 1511)   TDaP vaccine    03/08/18                   HIV: Non Reactive (03/19 1511)   Baby Food   bottle                             GBS:  Contraception Nexplanon Pap: 09/07/2017 NILM  CBB   Maternal history of ASD [x]  fetal echo at 20-24 weeks  CS/VBAC    Support Person              Congenital heart disease, maternal, antepartum 01/23/2016 by Vicki Adams, [place], MD No   Overview Addendum 01/23/2018  9:22 PM by Vicki Adams, CNM    Hx of ASD repair at age 34 Had a normal echocardiogram 01/2016 which was normal [x ] Fetal Echo at 20-24 weeks-01/10/2018  [facility] Cardiology consult ( ) is another echo needed?          Preterm labor symptoms and general obstetric precautions including but not limited to vaginal bleeding, contractions, leaking of fluid and fetal movement were reviewed in detail with the patient.    Return in about 2 weeks (around 04/05/2018) for rob.  Vicki Adams, CNM 03/22/2018 9:39 AM
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
## 2018-03-22 NOTE — Addendum Note (Signed)
Addended by: [place], Alegandra Sommers B on: 03/22/2018 09:45 AM   Modules accepted: Orders

## 2018-03-23 ENCOUNTER — Observation Stay
Admission: EM | Admit: 2018-03-23 | Discharge: 2018-03-23 | Disposition: A | Discharge status: Home / Self Care | Payer: Medicaid Other | Attending: Advanced Practice Midwife | Admitting: Advanced Practice Midwife

## 2018-03-23 ENCOUNTER — Other Ambulatory Visit: Payer: Self-pay

## 2018-03-23 DIAGNOSIS — O4703 False labor before 37 completed weeks of gestation, third trimester: Secondary | ICD-10-CM

## 2018-03-23 DIAGNOSIS — O479 False labor, unspecified: Secondary | ICD-10-CM

## 2018-03-23 DIAGNOSIS — R197 Diarrhea, unspecified: Secondary | ICD-10-CM | POA: Insufficient documentation

## 2018-03-23 DIAGNOSIS — Z3A32 32 weeks gestation of pregnancy: Secondary | ICD-10-CM | POA: Diagnosis not present

## 2018-03-23 DIAGNOSIS — O9989 Other specified diseases and conditions complicating pregnancy, childbirth and the puerperium: Secondary | ICD-10-CM | POA: Diagnosis not present

## 2018-03-23 DIAGNOSIS — Z881 Allergy status to other antibiotic agents status: Secondary | ICD-10-CM | POA: Insufficient documentation

## 2018-03-23 NOTE — Progress Notes (Signed)
Patient in room eating. Per Tresea Mall, CNM patient off the monitor to eat.

## 2018-03-23 NOTE — OB Triage Note (Signed)
Pt c/o ctx that started this morning and she is having to breath through a few of them. She is scheduled for IOL on Nov 16. Still feeling baby move and denies any clear thin discharge or bleeding.

## 2018-03-23 NOTE — Discharge Summary (Signed)
Physician Final Progress Note  Patient ID: Vicki Adams MRN: [id number] DOB/AGE: 1995-07-27 22 y.o.  Admit date: 03/23/2018 Admitting provider: Vena Austria, MD Discharge date: 03/23/2018   Admission Diagnoses: occasional painful uterine contractions  Discharge Diagnoses:  Active Problems:   Indication for care in labor and delivery, antepartum   Irregular uterine contractions Reactive NST, Not in labor  History of Present Illness: The patient is a 22 y.o. female G3P2002 at [redacted]w[redacted]d who presents for some painful contractions that started around 10 am this morning. She had a few during the day that she had to breathe through. She admits positive fetal movement. She denies leakage of fluid or vaginal bleeding. She had diarrhea 1 time while in triage. She denies nausea, vomiting, fever, chills, sob, aching or any other signs of illness. She denies any vaginal itching, irritation, discharge or odor. She denies any burning with urination.    Past Medical History:  Diagnosis Date  . Anemia   . Secundum ASD    a. 11/2000 s/p closure @ [facility]; b. 04/2015 Echo: Nl LV/RV fxn. Mild PR, triv TR. Stable ASD occluder; b. 01/2016 Echo: EF 60-65%, no rwma.  . Syncope and collapse    a. With second pregnancy-->02/2016 Holter: sinus rhythm, elevated HR likely 2/2 pregnancy. No arrhythmias.    Past Surgical History:  Procedure Laterality Date  . CARDIAC SURGERY  11/2000  . HERNIA REPAIR  1995-07-28   congential diaphragmatic hernia repair done at 9 weeks of age at [facility]    No current facility-administered medications on file prior to encounter.    Current Outpatient Medications on File Prior to Encounter  Medication Sig Dispense Refill  . acetaminophen (TYLENOL) 500 MG tablet Take 2 tablets by mouth as needed.    . ferrous sulfate (FERROUSUL) 325 (65 FE) MG tablet Take 1 tablet (325 mg total) by mouth 2 (two) times daily with a meal. 60 tablet 4  . Prenat w/o A-FeCbGl-DSS-FA-DHA (CITRANATAL DHA)  27-1 & 250 MG tablet Take 1 tablet by mouth daily. 30 tablet 11    Allergies  Allergen Reactions  . Erythromycin Anaphylaxis, Swelling and Nausea And Vomiting    Social History   Socioeconomic History  . Marital status: Single    Spouse name: Not on file  . Number of children: 2  . Years of education: 57  . Highest education level: Not on file  Occupational History  . 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 Financial resource strain: Not on file  . Food insecurity:    Worry: Not on file    Inability: Not on file  . Transportation needs:    Medical: Not on file    Non-medical: Not on file  Tobacco Use  . Smoking status: Never Smoker  . Smokeless tobacco: Never Used  Substance and Sexual Activity  . Alcohol use: No    Comment: occ  . Drug use: No    Comment: ACID  . Sexual activity: Yes    Birth control/protection: Other-see comments    Comment: Tubal ligation  Lifestyle  . Physical activity:    Days per week: Not on file    Minutes per session: Not on file  . Stress: Not on file  Relationships  . Social connections:    Talks on phone: Not on file    Gets together: Not on file    Attends religious service: Not on file  Active member of club or organization: Not on file    Attends meetings of clubs or organizations: Not on file    Relationship status: Not on file  . Intimate partner violence:    Fear of current or ex partner: Not on file    Emotionally abused: Not on file    Physically abused: Not on file    Forced sexual activity: Not on file  Other Topics Concern  . Not on file  Social History Narrative  . Not on file    Physical Exam: BP (!) 112/58 (BP Location: Left Arm)   Pulse (!) 108   Temp 98.4 F (36.9 C) (Oral)   Resp 19   LMP 07/31/2017   Gen: NAD CV: RRR Pulm: CTAB Pelvic: posterior, closed, thick, -2 Toco: rare contraction, some uterine irritability noted Fetal  well being: 150 bpm baseline, moderate variability, +accelerations, -decelerations Ext: 2+ reflexes  Consults: None  Significant Findings/ Diagnostic Studies: none  Procedures: NST  Discharge Condition: good  Disposition: Discharge disposition: 01-Home or Self Care      Diet: Regular diet  Discharge Activity: Activity as tolerated  Discharge Instructions    Discharge activity:  No Restrictions   Complete by:  As directed    Discharge diet:  No restrictions   Complete by:  As directed    Increase hydration with h2o   Fetal Kick Count:  Lie on our left side for one hour after a meal, and count the number of times your baby kicks.  If it is less than 5 times, get up, move around and drink some juice.  Repeat the test 30 minutes later.  If it is still less than 5 kicks in an hour, notify your doctor.   Complete by:  As directed    No sexual activity restrictions   Complete by:  As directed    Notify physician for a general feeling that "something is not right"   Complete by:  As directed    Notify physician for increase or change in vaginal discharge   Complete by:  As directed    Notify physician for intestinal cramps, with or without diarrhea, sometimes described as "gas pain"   Complete by:  As directed    Notify physician for leaking of fluid   Complete by:  As directed    Notify physician for low, dull backache, unrelieved by heat or Tylenol   Complete by:  As directed    Notify physician for menstrual like cramps   Complete by:  As directed    Notify physician for pelvic pressure   Complete by:  As directed    Notify physician for uterine contractions.  These may be painless and feel like the uterus is tightening or the baby is  "balling up"   Complete by:  As directed    Notify physician for vaginal bleeding   Complete by:  As directed    PRETERM LABOR:  Includes any of the follwing symptoms that occur between 20 - [redacted] weeks gestation.  If these symptoms are not  stopped, preterm labor can result in preterm delivery, placing your baby at risk   Complete by:  As directed      Allergies as of 03/23/2018      Reactions   Erythromycin Anaphylaxis, Swelling, Nausea And Vomiting      Medication List    TAKE these medications   acetaminophen 500 MG tablet Commonly known as:  TYLENOL Take 2 tablets by mouth as  needed.   CITRANATAL DHA 27-1 & 250 MG tablet Take 1 tablet by mouth daily.   ferrous sulfate 325 (65 FE) MG tablet Take 1 tablet (325 mg total) by mouth 2 (two) times daily with a meal.      Follow-up Information    [facility]. Go to.   Specialty:  Obstetrics and Gynecology Why:  regular scheduled prenatal appointment Contact information: [address] [place] [address] [phone]          Total time spent taking care of this patient: 25 minutes  Signed: Tresea Mall, CNM  03/23/2018, 8:14 PM

## 2018-03-24 ENCOUNTER — Observation Stay
Admission: EM | Admit: 2018-03-24 | Discharge: 2018-03-24 | Disposition: A | Discharge status: Home / Self Care | Payer: Medicaid Other | Attending: Obstetrics and Gynecology | Admitting: Obstetrics and Gynecology

## 2018-03-24 DIAGNOSIS — O4700 False labor before 37 completed weeks of gestation, unspecified trimester: Secondary | ICD-10-CM | POA: Diagnosis not present

## 2018-03-24 DIAGNOSIS — O212 Late vomiting of pregnancy: Secondary | ICD-10-CM

## 2018-03-24 DIAGNOSIS — O368130 Decreased fetal movements, third trimester, not applicable or unspecified: Secondary | ICD-10-CM

## 2018-03-24 DIAGNOSIS — Z3A32 32 weeks gestation of pregnancy: Secondary | ICD-10-CM | POA: Insufficient documentation

## 2018-03-24 DIAGNOSIS — O9989 Other specified diseases and conditions complicating pregnancy, childbirth and the puerperium: Principal | ICD-10-CM | POA: Insufficient documentation

## 2018-03-24 DIAGNOSIS — O26893 Other specified pregnancy related conditions, third trimester: Secondary | ICD-10-CM | POA: Diagnosis present

## 2018-03-24 DIAGNOSIS — O4703 False labor before 37 completed weeks of gestation, third trimester: Secondary | ICD-10-CM

## 2018-03-24 DIAGNOSIS — R109 Unspecified abdominal pain: Secondary | ICD-10-CM | POA: Insufficient documentation

## 2018-03-24 LAB — URINALYSIS, COMPLETE (UACMP) WITH MICROSCOPIC
Bilirubin Urine: NEGATIVE
Glucose, UA: NEGATIVE mg/dL
Hgb urine dipstick: NEGATIVE
KETONES UR: 80 mg/dL — AB
LEUKOCYTES UA: NEGATIVE
Nitrite: NEGATIVE
PH: 6 (ref 5.0–8.0)
Protein, ur: 100 mg/dL — AB
Specific Gravity, Urine: 1.027 (ref 1.005–1.030)

## 2018-03-24 LAB — CHLAMYDIA/NGC RT PCR (ARMC ONLY)
CHLAMYDIA TR: NOT DETECTED
N gonorrhoeae: NOT DETECTED

## 2018-03-24 LAB — WET PREP, GENITAL
CLUE CELLS WET PREP: NONE SEEN
SPERM: NONE SEEN
Trich, Wet Prep: NONE SEEN
Yeast Wet Prep HPF POC: NONE SEEN

## 2018-03-24 LAB — GROUP B STREP BY PCR: GROUP B STREP BY PCR: NEGATIVE

## 2018-03-24 MED ORDER — LACTATED RINGERS IV BOLUS
1000.0000 mL | Freq: Once | INTRAVENOUS | Status: AC
  Administered 2018-03-24: 1000 mL via INTRAVENOUS

## 2018-03-24 MED ORDER — ACETAMINOPHEN 500 MG PO TABS
1000.0000 mg | ORAL_TABLET | Freq: Four times a day (QID) | ORAL | Status: DC | PRN
  Administered 2018-03-24: 1000 mg via ORAL
  Filled 2018-03-24: qty 2

## 2018-03-24 MED ORDER — FUSION 65-65-25-30 MG PO CAPS: 1.0000 | ORAL_CAPSULE | Freq: Every day | ORAL | 5 refills | Status: DC

## 2018-03-24 NOTE — Discharge Summary (Signed)
See Final progress note 

## 2018-03-24 NOTE — OB Triage Note (Signed)
Presents with complaints of constant abd pain since yesterday. Was here yesterday for same complaint. Denies any bleeding. States some leaking here and there but nothing constant. Monitors applied.

## 2018-03-24 NOTE — Final Progress Note (Signed)
Physician Final Progress Note  Patient ID: Vicki Adams MRN: [id number] DOB/AGE: 1996-02-12 22 y.o.  Admit date: 03/24/2018 Admitting provider: Conard Novak, MD Discharge date: 03/24/2018   Admission Diagnoses:  1) intrauterine pregnancy at [redacted]w[redacted]d 2) abdominal pain affecting pregnancy, third trimester  Discharge Diagnoses:  1) intrauterine pregnancy at [redacted]w[redacted]d 2) abdominal pain affecting pregnancy, third trimester - false labor   History of Present Illness: The patient is a 22 y.o. female G3P2002 at [redacted]w[redacted]d who presents for abdominal pain that started shortly after returning home from L&D triage last night. She has cried through contractions, has had emesis with them. She has not timed them.  She notes decreased fetal movement. She denies vaginal bleeding and leakage of fluid. She denies fevers, but has felt like she has had the chills.  +Nausea/emesis. Denies diarrhea and constipation. She denies vaginal symptoms of itching, burning, irritation, abnormal discharge.  Denies urinary symptoms.     Hospital Course: the patient was admitted to labor and delivery triage.  She had a pelvic exam and negative wet mount, UA. Her cervix was closed thick and high.  Her urine was very concentrated, she was tachycardic, and the fetus was borderline tachycardic.  Therefore, she was given 2 units of lactated Ringer's.  Her pulse normalized and the fetal baseline heart rate normalized.  Her contractions and back pain improved greatly.  Her vitals were normal and stable.  Given she had no ongoing acute issues, she was discharged in improved condition.  Past Medical History:  Diagnosis Date  . Anemia   . Secundum ASD    a. 11/2000 s/p closure @ [facility]; b. 04/2015 Echo: Nl LV/RV fxn. Mild PR, triv TR. Stable ASD occluder; b. 01/2016 Echo: EF 60-65%, no rwma.  . Syncope and collapse    a. With second pregnancy-->02/2016 Holter: sinus rhythm, elevated HR likely 2/2 pregnancy. No arrhythmias.    Past  Surgical History:  Procedure Laterality Date  . CARDIAC SURGERY  11/2000  . HERNIA REPAIR  23-Nov-1995   congential diaphragmatic hernia repair done at 61 weeks of age at [facility]    No current facility-administered medications on file prior to encounter.    Current Outpatient Medications on File Prior to Encounter  Medication Sig Dispense Refill  . acetaminophen (TYLENOL) 500 MG tablet Take 2 tablets by mouth as needed.    . ferrous sulfate (FERROUSUL) 325 (65 FE) MG tablet Take 1 tablet (325 mg total) by mouth 2 (two) times daily with a meal. 60 tablet 4  . Prenat w/o A-FeCbGl-DSS-FA-DHA (CITRANATAL DHA) 27-1 & 250 MG tablet Take 1 tablet by mouth daily. 30 tablet 11    Allergies  Allergen Reactions  . Erythromycin Anaphylaxis, Swelling and Nausea And Vomiting    Social History   Socioeconomic History  . Marital status: Single    Spouse name: Not on file  . Number of children: 2  . Years of education: 45  . Highest education level: Not on file  Occupational History  . 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 Financial resource strain: Not on file  . Food insecurity:    Worry: Not on file    Inability: Not on file  . Transportation needs:    Medical: Not on file    Non-medical: Not on file  Tobacco Use  . Smoking status: Never Smoker  . Smokeless tobacco: Never Used  Substance and Sexual Activity  .  Alcohol use: No    Comment: occ  . Drug use: No    Comment: ACID  . Sexual activity: Yes    Birth control/protection: Other-see comments    Comment: Tubal ligation  Lifestyle  . Physical activity:    Days per week: Not on file    Minutes per session: Not on file  . Stress: Not on file  Relationships  . Social connections:    Talks on phone: Not on file    Gets together: Not on file    Attends religious service: Not on file    Active member of club or organization: Not on file    Attends meetings of  clubs or organizations: Not on file    Relationship status: Not on file  . Intimate partner violence:    Fear of current or ex partner: Not on file    Emotionally abused: Not on file    Physically abused: Not on file    Forced sexual activity: Not on file  Other Topics Concern  . Not on file  Social History Narrative  . Not on file    Physical Exam: BP (!) 97/55 (BP Location: Right Arm)   Pulse 90   Temp 98.1 F (36.7 C) (Oral)   Resp 18   Ht 5\' 7"  (1.702 m)   Wt 84.4 kg   LMP 07/31/2017   BMI 29.13 kg/m   Gen: NAD CV: RRR Pulm: CTAB Pelvic: closed/thick/high (Female chaperone present for pelvic exam: ) Ext: no e/c/t  Consults: None  Significant Findings/ Diagnostic Studies:  Lab Results  Component Value Date   APPEARANCEUR HAZY (A) 03/24/2018   GLUCOSEU NEGATIVE 03/24/2018   BILIRUBINUR NEGATIVE 03/24/2018   KETONESUR 80 (A) 03/24/2018   LABSPEC 1.027 03/24/2018   HGBUR NEGATIVE 03/24/2018   PHURINE 6.0 03/24/2018   NITRITE NEGATIVE 03/24/2018   LEUKOCYTESUR NEGATIVE 03/24/2018   RBCU 6-10 03/24/2018   WBCU 0-5 03/24/2018   BACTERIA RARE (A) 03/24/2018   EPIU 0-5 03/24/2018   MUCOUSUACOMP PRESENT 09/15/2014    Lab Results  Component Value Date   TRICHWETPREP NONE SEEN 03/24/2018   CLUECELLS NONE SEEN 03/24/2018   WBCWETPREP MANY (A) 03/24/2018   YEASTWETPREP NONE SEEN 03/24/2018    Gonorrhea/chlamydia pending  Procedures:  NST Baseline FHR: 135 beats/min Variability: moderate Accelerations: present Decelerations: absent Tocometry: quiet  Interpretation:  INDICATIONS: decreased fetal movement and rule out uterine contractions RESULTS:  A NST procedure was performed with FHR monitoring and a normal baseline established, appropriate time of 20-40 minutes of evaluation, and accels >2 seen w 15x15 characteristics.  Results show a REACTIVE NST.    Discharge Condition: improved  Disposition: Discharge disposition: 01-Home or Self  Care       Diet: Regular diet  Discharge Activity: Activity as tolerated   Allergies as of 03/24/2018      Reactions   Erythromycin Anaphylaxis, Swelling, Nausea And Vomiting      Medication List    STOP taking these medications   ferrous sulfate 325 (65 FE) MG tablet     TAKE these medications   acetaminophen 500 MG tablet Commonly known as:  TYLENOL Take 2 tablets by mouth as needed.   CITRANATAL DHA 27-1 & 250 MG tablet Take 1 tablet by mouth daily.   FUSION 65-65-25-30 MG Caps Take 1 tablet by mouth daily.      Follow-up Information    [facility], [place]. Go on 04/05/2018.   Why:  Keep previously scheduled appointment Contact  information: [address] [place] [address] [phone]           Total time spent taking care of this patient: 30 minutes  Signed: Thomasene Mohair, MD  03/24/2018, 8:55 PM

## 2018-04-05 ENCOUNTER — Encounter: Payer: Self-pay | Admitting: Maternal Newborn

## 2018-04-05 ENCOUNTER — Ambulatory Visit (INDEPENDENT_AMBULATORY_CARE_PROVIDER_SITE_OTHER): Payer: Medicaid Other | Admitting: Maternal Newborn

## 2018-04-05 ENCOUNTER — Ambulatory Visit (INDEPENDENT_AMBULATORY_CARE_PROVIDER_SITE_OTHER): Payer: Medicaid Other

## 2018-04-05 VITALS — BP 110/60 | Wt 184.0 lb

## 2018-04-05 DIAGNOSIS — O26843 Uterine size-date discrepancy, third trimester: Secondary | ICD-10-CM

## 2018-04-05 DIAGNOSIS — R3911 Hesitancy of micturition: Secondary | ICD-10-CM

## 2018-04-05 DIAGNOSIS — Z3480 Encounter for supervision of other normal pregnancy, unspecified trimester: Secondary | ICD-10-CM

## 2018-04-05 DIAGNOSIS — Z3689 Encounter for other specified antenatal screening: Secondary | ICD-10-CM

## 2018-04-05 DIAGNOSIS — Z3A34 34 weeks gestation of pregnancy: Secondary | ICD-10-CM

## 2018-04-05 DIAGNOSIS — R82998 Other abnormal findings in urine: Secondary | ICD-10-CM

## 2018-04-05 DIAGNOSIS — O9989 Other specified diseases and conditions complicating pregnancy, childbirth and the puerperium: Secondary | ICD-10-CM

## 2018-04-05 LAB — POCT URINALYSIS DIPSTICK OB: Glucose, UA: NEGATIVE

## 2018-04-05 LAB — POCT URINALYSIS DIPSTICK
Bilirubin, UA: NEGATIVE
Blood, UA: POSITIVE
Glucose, UA: NEGATIVE
Ketones, UA: NEGATIVE
NITRITE UA: NEGATIVE
PH UA: 6.5 (ref 5.0–8.0)
PROTEIN UA: POSITIVE — AB
SPEC GRAV UA: 1.02 (ref 1.010–1.025)
UROBILINOGEN UA: NEGATIVE U/dL — AB

## 2018-04-05 NOTE — Progress Notes (Signed)
Routine Prenatal Care Visit  Subjective  Vicki Adams is a 22 y.o. G3P2002 at [redacted]w[redacted]d being seen today for ongoing prenatal care.  She is currently monitored for the following issues for this low-risk pregnancy and has History of percutaneous transcatheter closure of congenital ASD; History of esophageal hernia repair; Congenital heart disease, maternal, antepartum; Rh negative state in antepartum period; Supervision of other normal pregnancy, antepartum; History of postpartum depression, currently pregnant; Abdominal pain during pregnancy in second trimester; Antepartum anemia; Irregular uterine contractions; and Abdominal pain during pregnancy in third trimester on their problem list.  ----------------------------------------------------------------------------------- Patient reports some difficulty starting urine stream and pressure this morning.   Contractions: Irregular. Vag. Bleeding: None.  Movement: Present. No leaking of fluid.  ----------------------------------------------------------------------------------- The following portions of the patient's history were reviewed and updated as appropriate: allergies, current medications, past family history, past medical history, past social history, past surgical history and problem list. Problem list updated.  Objective  Blood pressure 110/60, weight 184 lb (83.5 kg), last menstrual period 07/31/2017. Pregravid weight 180 lb (81.6 kg) Total Weight Gain 4 lb (1.814 kg) Body mass index is 28.82 kg/m.   Urinalysis:  Protein Trace, Glucose Negative Fetal Status: Fetal Heart Rate (bpm): 138 Fundal Height: 32 cm Movement: Present     General:  Alert, oriented and cooperative. Patient is in no acute distress.  Skin: Skin is warm and dry. No rash noted.   Cardiovascular: Normal heart rate noted  Respiratory: Normal respiratory effort, no problems with respiration noted  Abdomen: Soft, gravid, appropriate for gestational age.  Pain/Pressure: Present     Pelvic:  Cervical exam deferred        Extremities: Normal range of motion.  Edema: None  Mental Status: Normal mood and affect. Normal behavior. Normal judgment and thought content.   States that baby moved less than usual yesterday. Fetal movement appreciated during exam.   Assessment   22 y.o. [id number] at [redacted]w[redacted]d, EDD 05/14/2018 by Ultrasound presenting for a routine prenatal visit.  Plan   THIRD Problems (from 09/07/17 to present)    Problem Noted Resolved   Antepartum anemia 03/08/2018 by Natale Milch, MD No   Overview Signed 03/08/2018  5:08 PM by Natale Milch, MD    [ ]  recheck at 36 weeks, refr to hematology if there is not an improvement.       Rh negative state in antepartum period 09/07/2017 by Oswaldo Conroy, CNM No   Overview Signed 09/21/2017  4:27 PM by Vena Austria, MD    [ ]  rhogam 28 weeks      Supervision of other normal pregnancy, antepartum 09/07/2017 by Oswaldo Conroy, CNM No   Overview Addendum 03/08/2018  5:07 PM by Natale Milch, MD    Clinic [facility] Prenatal Labs  Dating 6 week [place] Blood type: O/Negative/-- (03/19 1511)   Genetic Screen NIPS: Negative XX     Carrier testing Fragile X, SMA, CF: Antibody:Negative (03/19 1511)  Anatomic [place] Complete 7/16; normal Rubella: 1.65 (03/19 1511) Varicella: Non-immune  GTT Third trimester: 100 RPR: Non Reactive (03/19 1511)   Rhogam [x]  28 weeks HBsAg: Negative (03/19 1511)   TDaP vaccine    03/08/18                   HIV: Non Reactive (03/19 1511)   Baby Food   bottle  GBS:   Contraception Nexplanon Pap: 09/07/2017 NILM  CBB   Maternal history of ASD [x]  fetal echo at 20-24 weeks  CS/VBAC    Support Person              Congenital heart disease, maternal, antepartum 01/23/2016 by [place]gut, [place], MD No   Overview Addendum 01/23/2018  9:22 PM by Farrel Conners, CNM    Hx of ASD repair at age 49 Had a normal echocardiogram 01/2016  which was normal [x ] Fetal Echo at 20-24 weeks-01/10/2018 [facility] Cardiology consult ( ) is another echo needed?       Urine culture sent for leukocytes on UA and symptoms today.  Growth scan ordered for fundal height less than dates; she will  schedule before her next ROB visit.  Preterm labor symptoms and general obstetric precautions were reviewed.   Addendum: Growth scan was done today with fetal growth at 42.1 percent, EFW 5 lb 4 oz and AFI 10.6 cm.   Advised going to L&D triage/ fetal movement count at home, if she still feels that baby is moving less when she is at rest and concentrating on movements.  Return in about 2 weeks (around 04/19/2018) for ROB.  Marcelyn Bruins, CNM 04/05/2018  9:49 AM

## 2018-04-05 NOTE — Progress Notes (Signed)
C/o didn't feel baby move much yesterday; pain underneath belly button; hard to pee this am.rj

## 2018-04-07 LAB — URINE CULTURE: Organism ID, Bacteria: NO GROWTH

## 2018-04-13 ENCOUNTER — Observation Stay
Admission: EM | Admit: 2018-04-13 | Discharge: 2018-04-13 | Disposition: A | Discharge status: Home / Self Care | Payer: Medicaid Other | Attending: Obstetrics & Gynecology | Admitting: Obstetrics & Gynecology

## 2018-04-13 DIAGNOSIS — Z3A35 35 weeks gestation of pregnancy: Secondary | ICD-10-CM | POA: Insufficient documentation

## 2018-04-13 DIAGNOSIS — Q249 Congenital malformation of heart, unspecified: Secondary | ICD-10-CM

## 2018-04-13 DIAGNOSIS — O2343 Unspecified infection of urinary tract in pregnancy, third trimester: Secondary | ICD-10-CM | POA: Diagnosis not present

## 2018-04-13 DIAGNOSIS — O99019 Anemia complicating pregnancy, unspecified trimester: Secondary | ICD-10-CM

## 2018-04-13 DIAGNOSIS — Z8774 Personal history of (corrected) congenital malformations of heart and circulatory system: Secondary | ICD-10-CM | POA: Diagnosis not present

## 2018-04-13 DIAGNOSIS — B9689 Other specified bacterial agents as the cause of diseases classified elsewhere: Secondary | ICD-10-CM | POA: Diagnosis not present

## 2018-04-13 DIAGNOSIS — O23593 Infection of other part of genital tract in pregnancy, third trimester: Secondary | ICD-10-CM | POA: Diagnosis present

## 2018-04-13 DIAGNOSIS — Z79899 Other long term (current) drug therapy: Secondary | ICD-10-CM | POA: Insufficient documentation

## 2018-04-13 DIAGNOSIS — Z6791 Unspecified blood type, Rh negative: Secondary | ICD-10-CM

## 2018-04-13 DIAGNOSIS — O368130 Decreased fetal movements, third trimester, not applicable or unspecified: Secondary | ICD-10-CM | POA: Diagnosis not present

## 2018-04-13 DIAGNOSIS — O99419 Diseases of the circulatory system complicating pregnancy, unspecified trimester: Secondary | ICD-10-CM

## 2018-04-13 DIAGNOSIS — Z3480 Encounter for supervision of other normal pregnancy, unspecified trimester: Secondary | ICD-10-CM

## 2018-04-13 DIAGNOSIS — O26899 Other specified pregnancy related conditions, unspecified trimester: Secondary | ICD-10-CM

## 2018-04-13 LAB — URINALYSIS, COMPLETE (UACMP) WITH MICROSCOPIC
GLUCOSE, UA: NEGATIVE mg/dL
LEUKOCYTES UA: NEGATIVE
NITRITE: NEGATIVE
Protein, ur: 30 mg/dL — AB
SPECIFIC GRAVITY, URINE: 1.025 (ref 1.005–1.030)
pH: 6.5 (ref 5.0–8.0)

## 2018-04-13 LAB — WET PREP, GENITAL
Sperm: NONE SEEN
Trich, Wet Prep: NONE SEEN
YEAST WET PREP: NONE SEEN

## 2018-04-13 MED ORDER — METRONIDAZOLE 500 MG PO TABS: 500.0000 mg | ORAL_TABLET | Freq: Two times a day (BID) | ORAL | 0 refills | Status: AC

## 2018-04-13 NOTE — OB Triage Note (Signed)
Pt presents to unit via ED c/o lower abdominal tightness since yesterday @ 1030 and decreased fetal movement throughout the day. Pt rates pain 4/10 intermittently. Pt states she had a large amount of white, normal odor vaginal discharge. Upon RN assessment, no discharge noticed. Pt denies vaginal bleeding, leakage of fluids. External monitors applied and assessing. Vital signs WDL. Will continue to monitor.

## 2018-04-13 NOTE — Discharge Summary (Signed)
See Final Progress Note 04/13/2018.

## 2018-04-13 NOTE — Final Progress Note (Signed)
Physician Final Progress Note  Patient ID: Vicki Adams MRN: [id number] DOB/AGE: [birth year] 22 y.o.  Admit date: 04/13/2018 Admitting provider: Nadara Mustard, MD Discharge date: 04/13/2018   Admission Diagnoses:  Indication for care in labor and delivery, antepartum  Discharge Diagnoses:  Bacterial vaginosis   History of Present Illness: The patient is a 22 y.o. female G3P2002 at [redacted]w[redacted]d who presents for intermittent pain in her lower abdomen since yesterday morning. She rates the pain as 4/10. She has not tried anything to relieve it. She also reports an increase in vaginal discharge, described as thin and white. She denies vaginal bleeding and loss of fluid. She reports a decrease in fetal movement today. No dysuria, nausea or emesis.  Review of Systems: Review of systems negative unless otherwise noted in HPI.   Past Medical History:  Diagnosis Date  . Anemia   . Secundum ASD    a. 11/2000 s/p closure @ [facility]; b. 04/2015 Echo: Nl LV/RV fxn. Mild PR, triv TR. Stable ASD occluder; b. 01/2016 Echo: EF 60-65%, no rwma.  . Syncope and collapse    a. With second pregnancy-->02/2016 Holter: sinus rhythm, elevated HR likely 2/2 pregnancy. No arrhythmias.    Past Surgical History:  Procedure Laterality Date  . CARDIAC SURGERY  11/2000  . HERNIA REPAIR  1995/11/04   congential diaphragmatic hernia repair done at 55 weeks of age at [facility]    No current facility-administered medications on file prior to encounter.    Current Outpatient Medications on File Prior to Encounter  Medication Sig Dispense Refill  . Fe Fum-Fe Poly-Vit C-Lactobac (FUSION) 65-65-25-30 MG CAPS Take 1 tablet by mouth daily. 30 capsule 5  . Prenat w/o A-FeCbGl-DSS-FA-DHA (CITRANATAL DHA) 27-1 & 250 MG tablet Take 1 tablet by mouth daily. 30 tablet 11  . acetaminophen (TYLENOL) 500 MG tablet Take 2 tablets by mouth as needed.      Allergies  Allergen Reactions  . Erythromycin Anaphylaxis, Swelling and Nausea And  Vomiting    Social History   Socioeconomic History  . Marital status: Single    Spouse name: Not on file  . Number of children: 2  . Years of education: 52  . Highest education level: Not on file  Occupational History  . 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 Financial resource strain: Not on file  . Food insecurity:    Worry: Not on file    Inability: Not on file  . Transportation needs:    Medical: Not on file    Non-medical: Not on file  Tobacco Use  . Smoking status: Never Smoker  . Smokeless tobacco: Never Used  Substance and Sexual Activity  . Alcohol use: No    Comment: occ  . Drug use: No    Comment: ACID  . Sexual activity: Yes    Birth control/protection: Other-see comments    Comment: Tubal ligation  Lifestyle  . Physical activity:    Days per week: Not on file    Minutes per session: Not on file  . Stress: Not on file  Relationships  . Social connections:    Talks on phone: Not on file    Gets together: Not on file    Attends religious service: Not on file    Active member of club or organization: Not on file    Attends meetings of clubs or organizations: Not on file    Relationship  status: Not on file  . Intimate partner violence:    Fear of current or ex partner: Not on file    Emotionally abused: Not on file    Physically abused: Not on file    Forced sexual activity: Not on file  Other Topics Concern  . Not on file  Social History Narrative  . Not on file    Family history: Negative/unremarkable except as detailed in HPI. No family history of birth defects.   Physical Exam: BP (!) 109/56 (BP Location: Right Arm)   Pulse 95   Temp 98.1 F (36.7 C) (Oral)   Resp 16   LMP 07/31/2017   Gen: NAD Pulm: No increased work of breathing Pelvic: SSE: no pooling, ferning negative on microscopy, moderate amount of thin, white discharge present. SVE: 0/thick/-2, external os  is open to a fingertip but closed internally Ext: No signs of DVT, no tenderness  NST: Baseline: 125 Variability: moderate Accelerations: present Decelerations: absent Tocometry: occasional uterine irritability The patient was monitored for 30+ minutes, fetal heart rate tracing was deemed reactive.  Consults: None  Significant Findings/ Diagnostic Studies: wet prep positive for clue cells, UA negative for nitrites and leukocytes  Procedures: SSE, NST  Discharge Condition: good  Disposition: Discharge disposition: 01-Home or Self Care       Diet: Regular diet  Discharge Activity: Activity as tolerated  Discharge Instructions    Discharge activity:  No Restrictions   Complete by:  As directed    Discharge diet:  No restrictions   Complete by:  As directed    Fetal Kick Count:  Lie on our left side for one hour after a meal, and count the number of times your baby kicks.  If it is less than 5 times, get up, move around and drink some juice.  Repeat the test 30 minutes later.  If it is still less than 5 kicks in an hour, notify your doctor.   Complete by:  As directed    LABOR:  When conractions begin, you should start to time them from the beginning of one contraction to the beginning  of the next.  When contractions are 5 - 10 minutes apart or less and have been regular for at least an hour, you should call your health care provider.   Complete by:  As directed    No sexual activity restrictions   Complete by:  As directed    Notify physician for bleeding from the vagina   Complete by:  As directed    Notify physician for blurring of vision or spots before the eyes   Complete by:  As directed    Notify physician for chills or fever   Complete by:  As directed    Notify physician for fainting spells, "black outs" or loss of consciousness   Complete by:  As directed    Notify physician for increase in vaginal discharge   Complete by:  As directed    Notify physician for  leaking of fluid   Complete by:  As directed    Notify physician for pain or burning when urinating   Complete by:  As directed    Notify physician for pelvic pressure (sudden increase)   Complete by:  As directed    Notify physician for severe or continued nausea or vomiting   Complete by:  As directed    Notify physician for sudden gushing of fluid from the vagina (with or without continued leaking)   Complete  by:  As directed    Notify physician for sudden, constant, or occasional abdominal pain   Complete by:  As directed    Notify physician if baby moving less than usual   Complete by:  As directed      Allergies as of 04/13/2018      Reactions   Erythromycin Anaphylaxis, Swelling, Nausea And Vomiting      Medication List    TAKE these medications   acetaminophen 500 MG tablet Commonly known as:  TYLENOL Take 2 tablets by mouth as needed.   CITRANATAL DHA 27-1 & 250 MG tablet Take 1 tablet by mouth daily.   FUSION 65-65-25-30 MG Caps Take 1 tablet by mouth daily.   metroNIDAZOLE 500 MG tablet Commonly known as:  FLAGYL Take 1 tablet (500 mg total) by mouth 2 (two) times daily for 7 days.      Rx sent for Flagyl to treat bacterial vaginosis. Rest and hydration encouraged. NST reactive and patient appreciated fetal movements in triage. Keep scheduled ROB visit.  Signed: Oswaldo Conroy, CNM  04/13/2018, 9:35 PM

## 2018-04-15 ENCOUNTER — Encounter: Payer: Self-pay | Admitting: *Deleted

## 2018-04-15 ENCOUNTER — Other Ambulatory Visit: Payer: Self-pay

## 2018-04-15 ENCOUNTER — Observation Stay
Admission: EM | Admit: 2018-04-15 | Discharge: 2018-04-15 | Disposition: A | Discharge status: Home / Self Care | Payer: Medicaid Other | Attending: Obstetrics and Gynecology | Admitting: Obstetrics and Gynecology

## 2018-04-15 DIAGNOSIS — O23599 Infection of other part of genital tract in pregnancy, unspecified trimester: Secondary | ICD-10-CM

## 2018-04-15 DIAGNOSIS — Z3A35 35 weeks gestation of pregnancy: Secondary | ICD-10-CM | POA: Insufficient documentation

## 2018-04-15 DIAGNOSIS — Z3480 Encounter for supervision of other normal pregnancy, unspecified trimester: Secondary | ICD-10-CM

## 2018-04-15 DIAGNOSIS — Z6791 Unspecified blood type, Rh negative: Secondary | ICD-10-CM

## 2018-04-15 DIAGNOSIS — O99419 Diseases of the circulatory system complicating pregnancy, unspecified trimester: Secondary | ICD-10-CM

## 2018-04-15 DIAGNOSIS — Z881 Allergy status to other antibiotic agents status: Secondary | ICD-10-CM | POA: Insufficient documentation

## 2018-04-15 DIAGNOSIS — O23593 Infection of other part of genital tract in pregnancy, third trimester: Secondary | ICD-10-CM | POA: Diagnosis not present

## 2018-04-15 DIAGNOSIS — N898 Other specified noninflammatory disorders of vagina: Secondary | ICD-10-CM

## 2018-04-15 DIAGNOSIS — O99019 Anemia complicating pregnancy, unspecified trimester: Secondary | ICD-10-CM

## 2018-04-15 DIAGNOSIS — B9689 Other specified bacterial agents as the cause of diseases classified elsewhere: Secondary | ICD-10-CM

## 2018-04-15 DIAGNOSIS — O26899 Other specified pregnancy related conditions, unspecified trimester: Secondary | ICD-10-CM

## 2018-04-15 DIAGNOSIS — O26893 Other specified pregnancy related conditions, third trimester: Secondary | ICD-10-CM

## 2018-04-15 DIAGNOSIS — N760 Acute vaginitis: Secondary | ICD-10-CM | POA: Diagnosis present

## 2018-04-15 DIAGNOSIS — Q249 Congenital malformation of heart, unspecified: Secondary | ICD-10-CM

## 2018-04-15 LAB — URINE CULTURE

## 2018-04-15 NOTE — Discharge Summary (Signed)
Physician Final Progress Note  Patient ID: Vicki Adams MRN: [id number] DOB/AGE: July 12, 1995 22 y.o.  Admit date: 04/15/2018 Admitting provider: Natale Milch, MD Discharge date: 04/15/2018   Admission Diagnoses: vaginal irritation and swelling  Discharge Diagnoses:  Active Problems:   Indication for care in labor and delivery, antepartum   Bacterial vaginosis in pregnancy   History of Present Illness: The patient is a 22 y.o. female G3P2002 at [redacted]w[redacted]d who presents for swelling and pain in her vagina since last night. She had a bowel movement followed by a feeling of swelling like a tampon, and pain to touch in her vagina. She admits good fetal movement. She has occasional contractions. She denies leaking of fluid or vaginal bleeding. She was here 2 days ago and was diagnosed with BV. She has not been able to pick up her medicine yet due to finances. She plans to pick up the prescription today. She has no other concerns at this time.   Past Medical History:  Diagnosis Date  . Anemia   . Secundum ASD    a. 11/2000 s/p closure @ [facility]; b. 04/2015 Echo: Nl LV/RV fxn. Mild PR, triv TR. Stable ASD occluder; b. 01/2016 Echo: EF 60-65%, no rwma.  . Syncope and collapse    a. With second pregnancy-->02/2016 Holter: sinus rhythm, elevated HR likely 2/2 pregnancy. No arrhythmias.    Past Surgical History:  Procedure Laterality Date  . CARDIAC SURGERY  11/2000  . HERNIA REPAIR  10-12-95   congential diaphragmatic hernia repair done at 79 weeks of age at [facility]    No current facility-administered medications on file prior to encounter.    Current Outpatient Medications on File Prior to Encounter  Medication Sig Dispense Refill  . acetaminophen (TYLENOL) 500 MG tablet Take 2 tablets by mouth as needed.    . Fe Fum-Fe Poly-Vit C-Lactobac (FUSION) 65-65-25-30 MG CAPS Take 1 tablet by mouth daily. 30 capsule 5  . Prenat w/o A-FeCbGl-DSS-FA-DHA (CITRANATAL DHA) 27-1 & 250 MG tablet Take 1  tablet by mouth daily. 30 tablet 11  . metroNIDAZOLE (FLAGYL) 500 MG tablet Take 1 tablet (500 mg total) by mouth 2 (two) times daily for 7 days. (Patient not taking: Reported on 04/15/2018) 14 tablet 0    Allergies  Allergen Reactions  . Erythromycin Anaphylaxis, Swelling and Nausea And Vomiting    Social History   Socioeconomic History  . Marital status: Single    Spouse name: Not on file  . Number of children: 2  . Years of education: 35  . Highest education level: Not on file  Occupational History  . 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 Financial resource strain: Not on file  . Food insecurity:    Worry: Not on file    Inability: Not on file  . Transportation needs:    Medical: Not on file    Non-medical: Not on file  Tobacco Use  . Smoking status: Never Smoker  . Smokeless tobacco: Never Used  Substance and Sexual Activity  . Alcohol use: No    Comment: occ  . Drug use: No    Comment: ACID  . Sexual activity: Yes    Birth control/protection: Other-see comments, Surgical    Comment: Tubal ligation  Lifestyle  . Physical activity:    Days per week: Not on file    Minutes per session: Not on file  . Stress: Not on  file  Relationships  . Social connections:    Talks on phone: Not on file    Gets together: Not on file    Attends religious service: Not on file    Active member of club or organization: Not on file    Attends meetings of clubs or organizations: Not on file    Relationship status: Not on file  . Intimate partner violence:    Fear of current or ex partner: Not on file    Emotionally abused: Not on file    Physically abused: Not on file    Forced sexual activity: Not on file  Other Topics Concern  . Not on file  Social History Narrative  . Not on file    Physical Exam: BP 105/63 (BP Location: Left Arm)   Pulse 85   Temp 98.4 F (36.9 C) (Oral)   Resp 16   Ht 5\' 7"   (1.702 m)   Wt 84.4 kg   LMP 07/31/2017   BMI 29.13 kg/m   Gen: NAD CV: RRR Pulm: CTAB Pelvic: No evidence of prolapse. Mildly swollen internal vaginal tissue with redness. No cervical exam. Toco: negative for contractions Fetal well being: baseline 140, moderate variability, +accelerations, -decelerations Ext: no evidence of DVT  Consults: None  Significant Findings/ Diagnostic Studies: none  Procedures: NST  Discharge Condition: good  Disposition: Discharge disposition: 01-Home or Self Care       Diet: Regular diet  Discharge Activity: Activity as tolerated  Discharge Instructions    Discharge activity:  No Restrictions   Complete by:  As directed    Discharge diet:  No restrictions   Complete by:  As directed    Fetal Kick Count:  Lie on our left side for one hour after a meal, and count the number of times your baby kicks.  If it is less than 5 times, get up, move around and drink some juice.  Repeat the test 30 minutes later.  If it is still less than 5 kicks in an hour, notify your doctor.   Complete by:  As directed    No sexual activity restrictions   Complete by:  As directed    Notify physician for a general feeling that "something is not right"   Complete by:  As directed    Notify physician for increase or change in vaginal discharge   Complete by:  As directed    Notify physician for intestinal cramps, with or without diarrhea, sometimes described as "gas pain"   Complete by:  As directed    Notify physician for leaking of fluid   Complete by:  As directed    Notify physician for low, dull backache, unrelieved by heat or Tylenol   Complete by:  As directed    Notify physician for menstrual like cramps   Complete by:  As directed    Notify physician for pelvic pressure   Complete by:  As directed    Notify physician for uterine contractions.  These may be painless and feel like the uterus is tightening or the baby is  "balling up"   Complete by:  As  directed    Notify physician for vaginal bleeding   Complete by:  As directed    PRETERM LABOR:  Includes any of the follwing symptoms that occur between 20 - [redacted] weeks gestation.  If these symptoms are not stopped, preterm labor can result in preterm delivery, placing your baby at risk   Complete by:  As  directed      Allergies as of 04/15/2018      Reactions   Erythromycin Anaphylaxis, Swelling, Nausea And Vomiting      Medication List    TAKE these medications   acetaminophen 500 MG tablet Commonly known as:  TYLENOL Take 2 tablets by mouth as needed.   CITRANATAL DHA 27-1 & 250 MG tablet Take 1 tablet by mouth daily.   FUSION 65-65-25-30 MG Caps Take 1 tablet by mouth daily.   metroNIDAZOLE 500 MG tablet Commonly known as:  FLAGYL Take 1 tablet (500 mg total) by mouth 2 (two) times daily for 7 days.      Follow-up Information    [facility]. Go to.   Specialty:  Obstetrics and Gynecology Why:  regular scheduled prenatal appointment Contact information: [address] [place] [address] [phone]          Total time spent taking care of this patient: 15 minutes  Signed: Tresea Mall, CNM  04/15/2018, 11:04 AM

## 2018-04-15 NOTE — OB Triage Note (Signed)
Noticed vaginal swelling "last night". Reports midwife telling her to come to hospital and check for "bladder coming out." Vicki Adams S

## 2018-04-15 NOTE — Telephone Encounter (Signed)
JS asked me to call pt to make sure she does as she advise through [employer]. Per pt, she is not feeling like she was last night, no pain or pressure. She says she feels ok now.

## 2018-04-18 ENCOUNTER — Other Ambulatory Visit: Payer: Self-pay | Admitting: Obstetrics & Gynecology

## 2018-04-18 MED ORDER — NITROFURANTOIN MONOHYD MACRO 100 MG PO CAPS: 100.0000 mg | ORAL_CAPSULE | Freq: Two times a day (BID) | ORAL | 0 refills | Status: DC

## 2018-04-18 NOTE — Progress Notes (Signed)
Pt aware.

## 2018-04-18 NOTE — Progress Notes (Signed)
Let her know urine test came back pos for UTI.  I have called in antibiotic to [facility].

## 2018-04-19 ENCOUNTER — Encounter: Payer: Self-pay | Admitting: Advanced Practice Midwife

## 2018-04-19 ENCOUNTER — Encounter: Payer: Medicaid Other | Admitting: Maternal Newborn

## 2018-04-19 ENCOUNTER — Ambulatory Visit (INDEPENDENT_AMBULATORY_CARE_PROVIDER_SITE_OTHER): Payer: Medicaid Other | Admitting: Advanced Practice Midwife

## 2018-04-19 VITALS — BP 118/74 | Wt 185.0 lb

## 2018-04-19 DIAGNOSIS — Z3A36 36 weeks gestation of pregnancy: Secondary | ICD-10-CM

## 2018-04-19 DIAGNOSIS — O360130 Maternal care for anti-D [Rh] antibodies, third trimester, not applicable or unspecified: Secondary | ICD-10-CM

## 2018-04-19 NOTE — Progress Notes (Signed)
Routine Prenatal Care Visit  Subjective  Vicki Adams is a 22 y.o. G3P2002 at [redacted]w[redacted]d being seen today for ongoing prenatal care.  She is currently monitored for the following issues for this high-risk pregnancy and has History of percutaneous transcatheter closure of congenital ASD; History of esophageal hernia repair; Congenital heart disease, maternal, antepartum; Rh negative state in antepartum period; Supervision of other normal pregnancy, antepartum; History of postpartum depression, currently pregnant; Abdominal pain during pregnancy in second trimester; Antepartum anemia; Irregular uterine contractions; Abdominal pain during pregnancy in third trimester; Indication for care in labor and delivery, antepartum; and Bacterial vaginosis in pregnancy on their problem list.  ----------------------------------------------------------------------------------- Patient reports she has not yet picked up the antibiotic for UTI. Encouraged her to take the medication. She would like to speak with Jola Babinski. Will send a message to Jola Babinski to contact patient.   Contractions: Irregular. Vag. Bleeding: None.  Movement: Present. Denies leaking of fluid.  ----------------------------------------------------------------------------------- The following portions of the patient's history were reviewed and updated as appropriate: allergies, current medications, past family history, past medical history, past social history, past surgical history and problem list. Problem list updated.   Objective  Blood pressure 118/74, weight 185 lb (83.9 kg), last menstrual period 07/31/2017, unknown if currently breastfeeding. Pregravid weight 180 lb (81.6 kg) Total Weight Gain 5 lb (2.268 kg) Urinalysis: Urine Protein    Urine Glucose    Fetal Status: Fetal Heart Rate (bpm): 140 Fundal Height: 36 cm Movement: Present     General:  Alert, oriented and cooperative. Patient is in no acute distress.  Skin: Skin is warm  and dry. No rash noted.   Cardiovascular: Normal heart rate noted  Respiratory: Normal respiratory effort, no problems with respiration noted  Abdomen: Soft, gravid, appropriate for gestational age. Pain/Pressure: Present     Pelvic:  Cervical exam deferred        Extremities: Normal range of motion.     Mental Status: Normal mood and affect. Normal behavior. Normal judgment and thought content.   Assessment   22 y.o. [id number] at [redacted]w[redacted]d by  05/14/2018, by Ultrasound presenting for routine prenatal visit  Plan   THIRD Problems (from 09/07/17 to present)    Problem Noted Resolved   Antepartum anemia 03/08/2018 by Natale Milch, MD No   Overview Signed 03/08/2018  5:08 PM by Natale Milch, MD    [ ]  recheck at 36 weeks, refr to hematology if there is not an improvement.       Rh negative state in antepartum period 09/07/2017 by Oswaldo Conroy, CNM No   Overview Signed 09/21/2017  4:27 PM by Vena Austria, MD    [ ]  rhogam 28 weeks      Supervision of other normal pregnancy, antepartum 09/07/2017 by Oswaldo Conroy, CNM No   Overview Addendum 03/08/2018  5:07 PM by Natale Milch, MD    Clinic [facility] Prenatal Labs  Dating 6 week [place] Blood type: O/Negative/-- (03/19 1511)   Genetic Screen NIPS: Negative XX     Carrier testing Fragile X, SMA, CF: Antibody:Negative (03/19 1511)  Anatomic [place] Complete 7/16; normal Rubella: 1.65 (03/19 1511) Varicella: Non-immune  GTT Third trimester: 100 RPR: Non Reactive (03/19 1511)   Rhogam [x]  28 weeks HBsAg: Negative (03/19 1511)   TDaP vaccine    03/08/18                   HIV: Non Reactive (03/19 1511)   Baby Food  bottle                             GBS:   Contraception Nexplanon Pap: 09/07/2017 NILM  CBB   Maternal history of ASD [x]  fetal echo at 20-24 weeks  CS/VBAC    Support Person              Congenital heart disease, maternal, antepartum 01/23/2016 by [place], [place], MD No   Overview Addendum 01/23/2018  9:22 PM  by Farrel Conners, CNM    Hx of ASD repair at age 53 Had a normal echocardiogram 01/2016 which was normal [x ] Fetal Echo at 20-24 weeks-01/10/2018 [facility] Cardiology consult ( ) is another echo needed?          Preterm labor symptoms and general obstetric precautions including but not limited to vaginal bleeding, contractions, leaking of fluid and fetal movement were reviewed in detail with the patient. Please refer to After Visit Summary for other counseling recommendations.   Return in about 1 week (around 04/26/2018) for rob.  Tresea Mall, CNM 04/19/2018 9:31 AM

## 2018-04-19 NOTE — Patient Instructions (Signed)
Vaginal Delivery Vaginal delivery means that you will give birth by pushing your baby out of your birth canal (vagina). A team of health care providers will help you before, during, and after vaginal delivery. Birth experiences are unique for every woman and every pregnancy, and birth experiences vary depending on where you choose to give birth. What should I do to prepare for my baby's birth? Before your baby is born, it is important to talk with your health care provider about:  Your labor and delivery preferences. These may include: ? Medicines that you may be given. ? How you will manage your pain. This might include non-medical pain relief techniques or injectable pain relief such as epidural analgesia. ? How you and your baby will be monitored during labor and delivery. ? Who may be in the labor and delivery room with you. ? Your feelings about surgical delivery of your baby (cesarean delivery, or C-section) if this becomes necessary. ? Your feelings about receiving donated blood through an IV tube (blood transfusion) if this becomes necessary.  Whether you are able: ? To take pictures or videos of the birth. ? To eat during labor and delivery. ? To move around, walk, or change positions during labor and delivery.  What to expect after your baby is born, such as: ? Whether delayed umbilical cord clamping and cutting is offered. ? Who will care for your baby right after birth. ? Medicines or tests that may be recommended for your baby. ? Whether breastfeeding is supported in your hospital or birth center. ? How long you will be in the hospital or birth center.  How any medical conditions you have may affect your baby or your labor and delivery experience.  To prepare for your baby's birth, you should also:  Attend all of your health care visits before delivery (prenatal visits) as recommended by your health care provider. This is important.  Prepare your home for your baby's  arrival. Make sure that you have: ? Diapers. ? Baby clothing. ? Feeding equipment. ? Safe sleeping arrangements for you and your baby.  Install a car seat in your vehicle. Have your car seat checked by a certified car seat installer to make sure that it is installed safely.  Think about who will help you with your new baby at home for at least the first several weeks after delivery.  What can I expect when I arrive at the birth center or hospital? Once you are in labor and have been admitted into the hospital or birth center, your health care provider may:  Review your pregnancy history and any concerns you have.  Insert an IV tube into one of your veins. This is used to give you fluids and medicines.  Check your blood pressure, pulse, temperature, and heart rate (vital signs).  Check whether your bag of water (amniotic sac) has broken (ruptured).  Talk with you about your birth plan and discuss pain control options.  Monitoring Your health care provider may monitor your contractions (uterine monitoring) and your baby's heart rate (fetal monitoring). You may need to be monitored:  Often, but not continuously (intermittently).  All the time or for long periods at a time (continuously). Continuous monitoring may be needed if: ? You are taking certain medicines, such as medicine to relieve pain or make your contractions stronger. ? You have pregnancy or labor complications.  Monitoring may be done by:  Placing a special stethoscope or a handheld monitoring device on your abdomen to   check your baby's heartbeat, and feeling your abdomen for contractions. This method of monitoring does not continuously record your baby's heartbeat or your contractions.  Placing monitors on your abdomen (external monitors) to record your baby's heartbeat and the frequency and length of contractions. You may not have to wear external monitors all the time.  Placing monitors inside of your uterus  (internal monitors) to record your baby's heartbeat and the frequency, length, and strength of your contractions. ? Your health care provider may use internal monitors if he or she needs more information about the strength of your contractions or your baby's heart rate. ? Internal monitors are put in place by passing a thin, flexible wire through your vagina and into your uterus. Depending on the type of monitor, it may remain in your uterus or on your baby's head until birth. ? Your health care provider will discuss the benefits and risks of internal monitoring with you and will ask for your permission before inserting the monitors.  Telemetry. This is a type of continuous monitoring that can be done with external or internal monitors. Instead of having to stay in bed, you are able to move around during telemetry. Ask your health care provider if telemetry is an option for you.  Physical exam Your health care provider may perform a physical exam. This may include:  Checking whether your baby is positioned: ? With the head toward your vagina (head-down). This is most common. ? With the head toward the top of your uterus (head-up or breech). If your baby is in a breech position, your health care provider may try to turn your baby to a head-down position so you can deliver vaginally. If it does not seem that your baby can be born vaginally, your provider may recommend surgery to deliver your baby. In rare cases, you may be able to deliver vaginally if your baby is head-up (breech delivery). ? Lying sideways (transverse). Babies that are lying sideways cannot be delivered vaginally.  Checking your cervix to determine: ? Whether it is thinning out (effacing). ? Whether it is opening up (dilating). ? How low your baby has moved into your birth canal.  What are the three stages of labor and delivery?  Normal labor and delivery is divided into the following three stages: Stage 1  Stage 1 is the  longest stage of labor, and it can last for hours or days. Stage 1 includes: ? Early labor. This is when contractions may be irregular, or regular and mild. Generally, early labor contractions are more than 10 minutes apart. ? Active labor. This is when contractions get longer, more regular, more frequent, and more intense. ? The transition phase. This is when contractions happen very close together, are very intense, and may last longer than during any other part of labor.  Contractions generally feel mild, infrequent, and irregular at first. They get stronger, more frequent (about every 2-3 minutes), and more regular as you progress from early labor through active labor and transition.  Many women progress through stage 1 naturally, but you may need help to continue making progress. If this happens, your health care provider may talk with you about: ? Rupturing your amniotic sac if it has not ruptured yet. ? Giving you medicine to help make your contractions stronger and more frequent.  Stage 1 ends when your cervix is completely dilated to 4 inches (10 cm) and completely effaced. This happens at the end of the transition phase. Stage 2  Once   your cervix is completely effaced and dilated to 4 inches (10 cm), you may start to feel an urge to push. It is common for the body to naturally take a rest before feeling the urge to push, especially if you received an epidural or certain other pain medicines. This rest period may last for up to 1-2 hours, depending on your unique labor experience.  During stage 2, contractions are generally less painful, because pushing helps relieve contraction pain. Instead of contraction pain, you may feel stretching and burning pain, especially when the widest part of your baby's head passes through the vaginal opening (crowning).  Your health care provider will closely monitor your pushing progress and your baby's progress through the vagina during stage 2.  Your  health care provider may massage the area of skin between your vaginal opening and anus (perineum) or apply warm compresses to your perineum. This helps it stretch as the baby's head starts to crown, which can help prevent perineal tearing. ? In some cases, an incision may be made in your perineum (episiotomy) to allow the baby to pass through the vaginal opening. An episiotomy helps to make the opening of the vagina larger to allow more room for the baby to fit through.  It is very important to breathe and focus so your health care provider can control the delivery of your baby's head. Your health care provider may have you decrease the intensity of your pushing, to help prevent perineal tearing.  After delivery of your baby's head, the shoulders and the rest of the body generally deliver very quickly and without difficulty.  Once your baby is delivered, the umbilical cord may be cut right away, or this may be delayed for 1-2 minutes, depending on your baby's health. This may vary among health care providers, hospitals, and birth centers.  If you and your baby are healthy enough, your baby may be placed on your chest or abdomen to help maintain the baby's temperature and to help you bond with each other. Some mothers and babies start breastfeeding at this time. Your health care team will dry your baby and help keep your baby warm during this time.  Your baby may need immediate care if he or she: ? Showed signs of distress during labor. ? Has a medical condition. ? Was born too early (prematurely). ? Had a bowel movement before birth (meconium). ? Shows signs of difficulty transitioning from being inside the uterus to being outside of the uterus. If you are planning to breastfeed, your health care team will help you begin a feeding. Stage 3  The third stage of labor starts immediately after the birth of your baby and ends after you deliver the placenta. The placenta is an organ that develops  during pregnancy to provide oxygen and nutrients to your baby in the womb.  Delivering the placenta may require some pushing, and you may have mild contractions. Breastfeeding can stimulate contractions to help you deliver the placenta.  After the placenta is delivered, your uterus should tighten (contract) and become firm. This helps to stop bleeding in your uterus. To help your uterus contract and to control bleeding, your health care provider may: ? Give you medicine by injection, through an IV tube, by mouth, or through your rectum (rectally). ? Massage your abdomen or perform a vaginal exam to remove any blood clots that are left in your uterus. ? Empty your bladder by placing a thin, flexible tube (catheter) into your bladder. ? Encourage   you to breastfeed your baby. After labor is over, you and your baby will be monitored closely to ensure that you are both healthy until you are ready to go home. Your health care team will teach you how to care for yourself and your baby. This information is not intended to replace advice given to you by your health care provider. Make sure you discuss any questions you have with your health care provider. Document Released: 03/17/2008 Document Revised: 12/27/2015 Document Reviewed: 06/23/2015 Elsevier Interactive Patient Education  2018 Elsevier Inc.  

## 2018-04-19 NOTE — Progress Notes (Signed)
No vb. No lof.  

## 2018-04-22 ENCOUNTER — Encounter: Payer: Self-pay | Admitting: *Deleted

## 2018-04-22 ENCOUNTER — Observation Stay
Admission: EM | Admit: 2018-04-22 | Discharge: 2018-04-22 | Disposition: A | Discharge status: Home / Self Care | Payer: Medicaid Other | Attending: Obstetrics & Gynecology | Admitting: Obstetrics & Gynecology

## 2018-04-22 DIAGNOSIS — O368130 Decreased fetal movements, third trimester, not applicable or unspecified: Principal | ICD-10-CM | POA: Insufficient documentation

## 2018-04-22 DIAGNOSIS — Q249 Congenital malformation of heart, unspecified: Secondary | ICD-10-CM

## 2018-04-22 DIAGNOSIS — Z87892 Personal history of anaphylaxis: Secondary | ICD-10-CM | POA: Insufficient documentation

## 2018-04-22 DIAGNOSIS — O99019 Anemia complicating pregnancy, unspecified trimester: Secondary | ICD-10-CM

## 2018-04-22 DIAGNOSIS — O26899 Other specified pregnancy related conditions, unspecified trimester: Secondary | ICD-10-CM

## 2018-04-22 DIAGNOSIS — O2343 Unspecified infection of urinary tract in pregnancy, third trimester: Secondary | ICD-10-CM | POA: Diagnosis not present

## 2018-04-22 DIAGNOSIS — O99419 Diseases of the circulatory system complicating pregnancy, unspecified trimester: Secondary | ICD-10-CM

## 2018-04-22 DIAGNOSIS — Z3A36 36 weeks gestation of pregnancy: Secondary | ICD-10-CM | POA: Insufficient documentation

## 2018-04-22 DIAGNOSIS — Z881 Allergy status to other antibiotic agents status: Secondary | ICD-10-CM | POA: Diagnosis not present

## 2018-04-22 DIAGNOSIS — Z3689 Encounter for other specified antenatal screening: Secondary | ICD-10-CM

## 2018-04-22 DIAGNOSIS — Z6791 Unspecified blood type, Rh negative: Secondary | ICD-10-CM

## 2018-04-22 DIAGNOSIS — Z3480 Encounter for supervision of other normal pregnancy, unspecified trimester: Secondary | ICD-10-CM

## 2018-04-22 MED ORDER — CEPHALEXIN 500 MG PO CAPS: 500.0000 mg | ORAL_CAPSULE | Freq: Two times a day (BID) | ORAL | 0 refills | Status: DC

## 2018-04-22 NOTE — OB Triage Note (Signed)
Patient states she was having lower abdominal pain the past few days but it has gotten better but she has not felt the baby move since yesterday around 2200. Denies LOF or vaginal bleeding. Pt rates pain 4/10 tightening irregular. Pt states discharge is normal and less than it has been. Pt states she was diagnosed with BV and a UTI this week but hasn't taken any medicine for the UTI.

## 2018-04-22 NOTE — Discharge Summary (Signed)
Physician Final Progress Note  Patient ID: Vicki Adams MRN: [id number] DOB/AGE: Nov 17, 1995 22 y.o.  Admit date: 04/22/2018 Admitting provider: Nadara Mustard, MD Discharge date: 04/22/2018   Admission Diagnoses: decreased fetal movement  Discharge Diagnoses:  Active Problems:   Indication for care in labor and delivery, antepartum   NST (non-stress test) reactive   History of Present Illness: The patient is a 22 y.o. female G3P2002 at [redacted]w[redacted]d who presents for decreased fetal movement since yesterday. She had a few strong contractions in the past couple of days and hadn't felt the baby as much but felt relieved when she finally did feel her yesterday. Today she hadn't felt the baby at all. She remembers a similar problem with her previous pregnancy and it is worrying her. She denies leaking of fluid or vaginal bleeding.  She has finished most of her metronidazole. It has been making her nauseated so it is difficult to take. She will not be able to pick up her UTI medication until she gets paid again. She has felt the baby move several times since she has been here tonight.   Past Medical History:  Diagnosis Date  . Anemia   . Secundum ASD    a. 11/2000 s/p closure @ [facility]; b. 04/2015 Echo: Nl LV/RV fxn. Mild PR, triv TR. Stable ASD occluder; b. 01/2016 Echo: EF 60-65%, no rwma.  . Syncope and collapse    a. With second pregnancy-->02/2016 Holter: sinus rhythm, elevated HR likely 2/2 pregnancy. No arrhythmias.    Past Surgical History:  Procedure Laterality Date  . CARDIAC SURGERY  11/2000  . HERNIA REPAIR  07-13-95   congential diaphragmatic hernia repair done at 77 weeks of age at [facility]    No current facility-administered medications on file prior to encounter.    Current Outpatient Medications on File Prior to Encounter  Medication Sig Dispense Refill  . acetaminophen (TYLENOL) 500 MG tablet Take 2 tablets by mouth as needed.    . Fe Fum-Fe Poly-Vit C-Lactobac (FUSION)  65-65-25-30 MG CAPS Take 1 tablet by mouth daily. 30 capsule 5  . Prenat w/o A-FeCbGl-DSS-FA-DHA (CITRANATAL DHA) 27-1 & 250 MG tablet Take 1 tablet by mouth daily. 30 tablet 11    Allergies  Allergen Reactions  . Erythromycin Anaphylaxis, Swelling and Nausea And Vomiting    Social History   Socioeconomic History  . Marital status: Single    Spouse name: Not on file  . Number of children: 2  . Years of education: 64  . Highest education level: Not on file  Occupational History  . 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 Financial resource strain: Not on file  . Food insecurity:    Worry: Not on file    Inability: Not on file  . Transportation needs:    Medical: Not on file    Non-medical: Not on file  Tobacco Use  . Smoking status: Never Smoker  . Smokeless tobacco: Never Used  Substance and Sexual Activity  . Alcohol use: No    Comment: occ  . Drug use: No    Comment: ACID  . Sexual activity: Yes    Birth control/protection: Other-see comments, Surgical    Comment: Tubal ligation  Lifestyle  . Physical activity:    Days per week: Not on file    Minutes per session: Not on file  . Stress: Not on file  Relationships  . Social  connections:    Talks on phone: Not on file    Gets together: Not on file    Attends religious service: Not on file    Active member of club or organization: Not on file    Attends meetings of clubs or organizations: Not on file    Relationship status: Not on file  . Intimate partner violence:    Fear of current or ex partner: Not on file    Emotionally abused: Not on file    Physically abused: Not on file    Forced sexual activity: Not on file  Other Topics Concern  . Not on file  Social History Narrative  . Not on file    Physical Exam: LMP 07/31/2017   Gen: NAD CV: RRR Pulm: CTAB Pelvic: FT/30/-2 posterior Toco: irritability Fetal well being: baseline  130, moderate variability, +accelerations, -decelerations Ext: no evidence of DVT  Consults: None  Significant Findings/ Diagnostic Studies: none  Procedures: NST  Discharge Condition: good  Disposition: Discharge disposition: 01-Home or Self Care       Diet: Regular diet  Discharge Activity: Activity as tolerated  Discharge Instructions    Discharge activity:  No Restrictions   Complete by:  As directed    Discharge diet:  No restrictions   Complete by:  As directed    Fetal Kick Count:  Lie on our left side for one hour after a meal, and count the number of times your baby kicks.  If it is less than 5 times, get up, move around and drink some juice.  Repeat the test 30 minutes later.  If it is still less than 5 kicks in an hour, notify your doctor.   Complete by:  As directed    LABOR:  When conractions begin, you should start to time them from the beginning of one contraction to the beginning  of the next.  When contractions are 5 - 10 minutes apart or less and have been regular for at least an hour, you should call your health care provider.   Complete by:  As directed    No sexual activity restrictions   Complete by:  As directed    Notify physician for bleeding from the vagina   Complete by:  As directed    Notify physician for blurring of vision or spots before the eyes   Complete by:  As directed    Notify physician for chills or fever   Complete by:  As directed    Notify physician for fainting spells, "black outs" or loss of consciousness   Complete by:  As directed    Notify physician for increase in vaginal discharge   Complete by:  As directed    Notify physician for leaking of fluid   Complete by:  As directed    Notify physician for pain or burning when urinating   Complete by:  As directed    Notify physician for pelvic pressure (sudden increase)   Complete by:  As directed    Notify physician for severe or continued nausea or vomiting   Complete by:   As directed    Notify physician for sudden gushing of fluid from the vagina (with or without continued leaking)   Complete by:  As directed    Notify physician for sudden, constant, or occasional abdominal pain   Complete by:  As directed    Notify physician if baby moving less than usual   Complete by:  As directed  Allergies as of 04/22/2018      Reactions   Erythromycin Anaphylaxis, Swelling, Nausea And Vomiting      Medication List    STOP taking these medications   nitrofurantoin (macrocrystal-monohydrate) 100 MG capsule Commonly known as:  MACROBID     TAKE these medications   acetaminophen 500 MG tablet Commonly known as:  TYLENOL Take 2 tablets by mouth as needed.   cephALEXin 500 MG capsule Commonly known as:  KEFLEX Take 1 capsule (500 mg total) by mouth 2 (two) times daily for 7 days. Start taking on:  04/25/2018   CITRANATAL DHA 27-1 & 250 MG tablet Take 1 tablet by mouth daily.   FUSION 65-65-25-30 MG Caps Take 1 tablet by mouth daily.      Follow-up Information    [facility] Follow up.   Specialty:  Obstetrics and Gynecology Contact information: [address] [place] [address] [phone]          Total time spent taking care of this patient: 15 minutes  Signed: Tresea Mall, CNM  04/22/2018, 7:39 PM

## 2018-04-22 NOTE — H&P (Signed)
See Discharge Summary

## 2018-04-27 ENCOUNTER — Encounter: Payer: Self-pay | Admitting: Maternal Newborn

## 2018-04-27 ENCOUNTER — Ambulatory Visit (INDEPENDENT_AMBULATORY_CARE_PROVIDER_SITE_OTHER): Payer: Medicaid Other | Admitting: Maternal Newborn

## 2018-04-27 VITALS — BP 114/66 | Wt 186.0 lb

## 2018-04-27 DIAGNOSIS — Z3480 Encounter for supervision of other normal pregnancy, unspecified trimester: Secondary | ICD-10-CM

## 2018-04-27 DIAGNOSIS — Z3A37 37 weeks gestation of pregnancy: Secondary | ICD-10-CM

## 2018-04-27 NOTE — Progress Notes (Signed)
Routine Prenatal Care Visit  Subjective  Vicki Adams is a 22 y.o. G3P2002 at [redacted]w[redacted]d being seen today for ongoing prenatal care.  She is currently monitored for the following issues for this low-risk pregnancy and has History of percutaneous transcatheter closure of congenital ASD; History of esophageal hernia repair; Congenital heart disease, maternal, antepartum; Rh negative state in antepartum period; Supervision of other normal pregnancy, antepartum; History of postpartum depression, currently pregnant; Abdominal pain during pregnancy in second trimester; Antepartum anemia; Irregular uterine contractions; Abdominal pain during pregnancy in third trimester; Bacterial vaginosis in pregnancy; and NST (non-stress test) reactive on their problem list.  ----------------------------------------------------------------------------------- Patient reports pubic bone pain and irregular contractions.   Contractions: Irregular. Vag. Bleeding: None.  Movement: Present. No leaking of fluid.  ----------------------------------------------------------------------------------- The following portions of the patient's history were reviewed and updated as appropriate: allergies, current medications, past family history, past medical history, past social history, past surgical history and problem list. Problem list updated.  Objective  Blood pressure 114/66, weight 186 lb (84.4 kg), last menstrual period 07/31/2017. Pregravid weight 180 lb (81.6 kg) Total Weight Gain 6 lb (2.722 kg) Body mass index is 29.13 kg/m.   Urinalysis: No sample today Fetal Status: Fetal Heart Rate (bpm): 153 Fundal Height: 36 cm Movement: Present     General:  Alert, oriented and cooperative. Patient is in no acute distress.  Skin: Skin is warm and dry. No rash noted.   Cardiovascular: Normal heart rate noted  Respiratory: Normal respiratory effort, no problems with respiration noted  Abdomen: Soft, gravid, appropriate  for gestational age. Pain/Pressure: Present     Pelvic:  Cervical exam performed Dilation: Fingertip Effacement (%): 20 Station: -2  Extremities: Normal range of motion.  Edema: None  Mental Status: Normal mood and affect. Normal behavior. Normal judgment and thought content.     Assessment   22 y.o. [id number] at [redacted]w[redacted]d, EDD 05/14/2018 by Ultrasound presenting for a routine prenatal visit.  Plan   THIRD Problems (from 09/07/17 to present)    Problem Noted Resolved   Antepartum anemia 03/08/2018 by Natale Milch, MD No   Overview Signed 03/08/2018  5:08 PM by Natale Milch, MD    [ ]  recheck at 36 weeks, refr to hematology if there is not an improvement.       Rh negative state in antepartum period 09/07/2017 by Oswaldo Conroy, CNM No   Overview Signed 09/21/2017  4:27 PM by Vena Austria, MD    [ ]  rhogam 28 weeks      Supervision of other normal pregnancy, antepartum 09/07/2017 by Oswaldo Conroy, CNM No   Overview Addendum 03/08/2018  5:07 PM by Natale Milch, MD    Clinic [facility] Prenatal Labs  Dating 6 week [place] Blood type: O/Negative/-- (03/19 1511)   Genetic Screen NIPS: Negative XX     Carrier testing Fragile X, SMA, CF: Antibody:Negative (03/19 1511)  Anatomic [place] Complete 7/16; normal Rubella: 1.65 (03/19 1511) Varicella: Non-immune  GTT Third trimester: 100 RPR: Non Reactive (03/19 1511)   Rhogam [x]  28 weeks HBsAg: Negative (03/19 1511)   TDaP vaccine    03/08/18                   HIV: Non Reactive (03/19 1511)   Baby Food   bottle  GBS:   Contraception Nexplanon Pap: 09/07/2017 NILM  CBB   Maternal history of ASD [x]  fetal echo at 20-24 weeks  CS/VBAC    Support Person              Congenital heart disease, maternal, antepartum 01/23/2016 by Grotegut, [place], MD No   Overview Addendum 01/23/2018  9:22 PM by Farrel Conners, CNM    Hx of ASD repair at age 67 Had a normal echocardiogram 01/2016 which was normal [x ]  Fetal Echo at 20-24 weeks-01/10/2018 [facility] Cardiology consult ( ) is another echo needed?         Term labor symptoms and general obstetric precautions including were reviewed.  Please refer to After Visit Summary for other counseling recommendations.   Return in about 1 week (around 05/04/2018) for ROB.  Marcelyn Bruins, CNM 04/27/2018  9:58 AM

## 2018-04-27 NOTE — Progress Notes (Signed)
ROB GBS 

## 2018-04-28 NOTE — Patient Instructions (Signed)
Vaginal Delivery Vaginal delivery means that you will give birth by pushing your baby out of your birth canal (vagina). A team of health care providers will help you before, during, and after vaginal delivery. Birth experiences are unique for every woman and every pregnancy, and birth experiences vary depending on where you choose to give birth. What should I do to prepare for my baby's birth? Before your baby is born, it is important to talk with your health care provider about:  Your labor and delivery preferences. These may include: ? Medicines that you may be given. ? How you will manage your pain. This might include non-medical pain relief techniques or injectable pain relief such as epidural analgesia. ? How you and your baby will be monitored during labor and delivery. ? Who may be in the labor and delivery room with you. ? Your feelings about surgical delivery of your baby (cesarean delivery, or C-section) if this becomes necessary. ? Your feelings about receiving donated blood through an IV tube (blood transfusion) if this becomes necessary.  Whether you are able: ? To take pictures or videos of the birth. ? To eat during labor and delivery. ? To move around, walk, or change positions during labor and delivery.  What to expect after your baby is born, such as: ? Whether delayed umbilical cord clamping and cutting is offered. ? Who will care for your baby right after birth. ? Medicines or tests that may be recommended for your baby. ? Whether breastfeeding is supported in your hospital or birth center. ? How long you will be in the hospital or birth center.  How any medical conditions you have may affect your baby or your labor and delivery experience.  To prepare for your baby's birth, you should also:  Attend all of your health care visits before delivery (prenatal visits) as recommended by your health care provider. This is important.  Prepare your home for your baby's  arrival. Make sure that you have: ? Diapers. ? Baby clothing. ? Feeding equipment. ? Safe sleeping arrangements for you and your baby.  Install a car seat in your vehicle. Have your car seat checked by a certified car seat installer to make sure that it is installed safely.  Think about who will help you with your new baby at home for at least the first several weeks after delivery.  What can I expect when I arrive at the birth center or hospital? Once you are in labor and have been admitted into the hospital or birth center, your health care provider may:  Review your pregnancy history and any concerns you have.  Insert an IV tube into one of your veins. This is used to give you fluids and medicines.  Check your blood pressure, pulse, temperature, and heart rate (vital signs).  Check whether your bag of water (amniotic sac) has broken (ruptured).  Talk with you about your birth plan and discuss pain control options.  Monitoring Your health care provider may monitor your contractions (uterine monitoring) and your baby's heart rate (fetal monitoring). You may need to be monitored:  Often, but not continuously (intermittently).  All the time or for long periods at a time (continuously). Continuous monitoring may be needed if: ? You are taking certain medicines, such as medicine to relieve pain or make your contractions stronger. ? You have pregnancy or labor complications.  Monitoring may be done by:  Placing a special stethoscope or a handheld monitoring device on your abdomen to   check your baby's heartbeat, and feeling your abdomen for contractions. This method of monitoring does not continuously record your baby's heartbeat or your contractions.  Placing monitors on your abdomen (external monitors) to record your baby's heartbeat and the frequency and length of contractions. You may not have to wear external monitors all the time.  Placing monitors inside of your uterus  (internal monitors) to record your baby's heartbeat and the frequency, length, and strength of your contractions. ? Your health care provider may use internal monitors if he or she needs more information about the strength of your contractions or your baby's heart rate. ? Internal monitors are put in place by passing a thin, flexible wire through your vagina and into your uterus. Depending on the type of monitor, it may remain in your uterus or on your baby's head until birth. ? Your health care provider will discuss the benefits and risks of internal monitoring with you and will ask for your permission before inserting the monitors.  Telemetry. This is a type of continuous monitoring that can be done with external or internal monitors. Instead of having to stay in bed, you are able to move around during telemetry. Ask your health care provider if telemetry is an option for you.  Physical exam Your health care provider may perform a physical exam. This may include:  Checking whether your baby is positioned: ? With the head toward your vagina (head-down). This is most common. ? With the head toward the top of your uterus (head-up or breech). If your baby is in a breech position, your health care provider may try to turn your baby to a head-down position so you can deliver vaginally. If it does not seem that your baby can be born vaginally, your provider may recommend surgery to deliver your baby. In rare cases, you may be able to deliver vaginally if your baby is head-up (breech delivery). ? Lying sideways (transverse). Babies that are lying sideways cannot be delivered vaginally.  Checking your cervix to determine: ? Whether it is thinning out (effacing). ? Whether it is opening up (dilating). ? How low your baby has moved into your birth canal.  What are the three stages of labor and delivery?  Normal labor and delivery is divided into the following three stages: Stage 1  Stage 1 is the  longest stage of labor, and it can last for hours or days. Stage 1 includes: ? Early labor. This is when contractions may be irregular, or regular and mild. Generally, early labor contractions are more than 10 minutes apart. ? Active labor. This is when contractions get longer, more regular, more frequent, and more intense. ? The transition phase. This is when contractions happen very close together, are very intense, and may last longer than during any other part of labor.  Contractions generally feel mild, infrequent, and irregular at first. They get stronger, more frequent (about every 2-3 minutes), and more regular as you progress from early labor through active labor and transition.  Many women progress through stage 1 naturally, but you may need help to continue making progress. If this happens, your health care provider may talk with you about: ? Rupturing your amniotic sac if it has not ruptured yet. ? Giving you medicine to help make your contractions stronger and more frequent.  Stage 1 ends when your cervix is completely dilated to 4 inches (10 cm) and completely effaced. This happens at the end of the transition phase. Stage 2  Once   your cervix is completely effaced and dilated to 4 inches (10 cm), you may start to feel an urge to push. It is common for the body to naturally take a rest before feeling the urge to push, especially if you received an epidural or certain other pain medicines. This rest period may last for up to 1-2 hours, depending on your unique labor experience.  During stage 2, contractions are generally less painful, because pushing helps relieve contraction pain. Instead of contraction pain, you may feel stretching and burning pain, especially when the widest part of your baby's head passes through the vaginal opening (crowning).  Your health care provider will closely monitor your pushing progress and your baby's progress through the vagina during stage 2.  Your  health care provider may massage the area of skin between your vaginal opening and anus (perineum) or apply warm compresses to your perineum. This helps it stretch as the baby's head starts to crown, which can help prevent perineal tearing. ? In some cases, an incision may be made in your perineum (episiotomy) to allow the baby to pass through the vaginal opening. An episiotomy helps to make the opening of the vagina larger to allow more room for the baby to fit through.  It is very important to breathe and focus so your health care provider can control the delivery of your baby's head. Your health care provider may have you decrease the intensity of your pushing, to help prevent perineal tearing.  After delivery of your baby's head, the shoulders and the rest of the body generally deliver very quickly and without difficulty.  Once your baby is delivered, the umbilical cord may be cut right away, or this may be delayed for 1-2 minutes, depending on your baby's health. This may vary among health care providers, hospitals, and birth centers.  If you and your baby are healthy enough, your baby may be placed on your chest or abdomen to help maintain the baby's temperature and to help you bond with each other. Some mothers and babies start breastfeeding at this time. Your health care team will dry your baby and help keep your baby warm during this time.  Your baby may need immediate care if he or she: ? Showed signs of distress during labor. ? Has a medical condition. ? Was born too early (prematurely). ? Had a bowel movement before birth (meconium). ? Shows signs of difficulty transitioning from being inside the uterus to being outside of the uterus. If you are planning to breastfeed, your health care team will help you begin a feeding. Stage 3  The third stage of labor starts immediately after the birth of your baby and ends after you deliver the placenta. The placenta is an organ that develops  during pregnancy to provide oxygen and nutrients to your baby in the womb.  Delivering the placenta may require some pushing, and you may have mild contractions. Breastfeeding can stimulate contractions to help you deliver the placenta.  After the placenta is delivered, your uterus should tighten (contract) and become firm. This helps to stop bleeding in your uterus. To help your uterus contract and to control bleeding, your health care provider may: ? Give you medicine by injection, through an IV tube, by mouth, or through your rectum (rectally). ? Massage your abdomen or perform a vaginal exam to remove any blood clots that are left in your uterus. ? Empty your bladder by placing a thin, flexible tube (catheter) into your bladder. ? Encourage   you to breastfeed your baby. After labor is over, you and your baby will be monitored closely to ensure that you are both healthy until you are ready to go home. Your health care team will teach you how to care for yourself and your baby. This information is not intended to replace advice given to you by your health care provider. Make sure you discuss any questions you have with your health care provider. Document Released: 03/17/2008 Document Revised: 12/27/2015 Document Reviewed: 06/23/2015 Elsevier Interactive Patient Education  2018 Elsevier Inc.  

## 2018-04-29 LAB — STREP GP B NAA: STREP GROUP B AG: POSITIVE — AB

## 2018-05-02 ENCOUNTER — Other Ambulatory Visit: Payer: Self-pay

## 2018-05-02 ENCOUNTER — Observation Stay
Admission: EM | Admit: 2018-05-02 | Discharge: 2018-05-02 | Disposition: A | Discharge status: Home / Self Care | Payer: Medicaid Other | Attending: Obstetrics and Gynecology | Admitting: Obstetrics and Gynecology

## 2018-05-02 DIAGNOSIS — Z3A38 38 weeks gestation of pregnancy: Secondary | ICD-10-CM

## 2018-05-02 DIAGNOSIS — Z3483 Encounter for supervision of other normal pregnancy, third trimester: Secondary | ICD-10-CM | POA: Diagnosis present

## 2018-05-02 DIAGNOSIS — O368130 Decreased fetal movements, third trimester, not applicable or unspecified: Secondary | ICD-10-CM

## 2018-05-02 LAB — URINALYSIS, COMPLETE (UACMP) WITH MICROSCOPIC
Bilirubin Urine: NEGATIVE
GLUCOSE, UA: NEGATIVE mg/dL
Hgb urine dipstick: NEGATIVE
KETONES UR: NEGATIVE mg/dL
Leukocytes, UA: NEGATIVE
NITRITE: NEGATIVE
PH: 6 (ref 5.0–8.0)
Protein, ur: 30 mg/dL — AB
SPECIFIC GRAVITY, URINE: 1.024 (ref 1.005–1.030)

## 2018-05-02 MED ORDER — ACETAMINOPHEN 325 MG PO TABS: 650.0000 mg | ORAL_TABLET | ORAL | Status: DC | PRN

## 2018-05-02 NOTE — Final Progress Note (Signed)
Physician Final Progress Note  Patient ID: Vicki Adams MRN: [id number] DOB/AGE: [birth year] 22 y.o.  Admit date: 05/02/2018 Admitting provider: Conard Novak, MD Discharge date: 05/02/2018   Admission Diagnoses:  Indication for care in labor and delivery, antepartum  Discharge Diagnoses: Supervision of other normal pregnancy, antepartum  History of Present Illness: The patient is a 22 y.o. female G3P2002 at [redacted]w[redacted]d who presents for decreased fetal movement and frequent Braxton Hicks contractions, lower back pain, pubic bone pain, and ongoing tingling and numbness in her legs. She had not felt fetal movement for a period of 7-8 hours, but could feel baby moving as monitors were applied in triage. She has not had any vaginal bleeding or rupture of membranes. Denies dysuria but has increased urinary frequency.  Review of Systems: Review of systems negative unless otherwise noted in HPI.   Past Medical History:  Diagnosis Date  . Anemia   . Secundum ASD    a. 11/2000 s/p closure @ [facility]; b. 04/2015 Echo: Nl LV/RV fxn. Mild PR, triv TR. Stable ASD occluder; b. 01/2016 Echo: EF 60-65%, no rwma.  . Syncope and collapse    a. With second pregnancy-->02/2016 Holter: sinus rhythm, elevated HR likely 2/2 pregnancy. No arrhythmias.    Past Surgical History:  Procedure Laterality Date  . CARDIAC SURGERY  11/2000  . HERNIA REPAIR  February 06, 1996   congential diaphragmatic hernia repair done at 37 weeks of age at [facility]    No current facility-administered medications on file prior to encounter.    Current Outpatient Medications on File Prior to Encounter  Medication Sig Dispense Refill  . Fe Fum-Fe Poly-Vit C-Lactobac (FUSION) 65-65-25-30 MG CAPS Take 1 tablet by mouth daily. 30 capsule 5  . Prenat w/o A-FeCbGl-DSS-FA-DHA (CITRANATAL DHA) 27-1 & 250 MG tablet Take 1 tablet by mouth daily. 30 tablet 11  . acetaminophen (TYLENOL) 500 MG tablet Take 2 tablets by mouth as needed.      Allergies   Allergen Reactions  . Erythromycin Anaphylaxis, Swelling and Nausea And Vomiting    Social History   Socioeconomic History  . Marital status: Single    Spouse name: Not on file  . Number of children: 2  . Years of education: 50  . Highest education level: Not on file  Occupational History  . 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 Financial resource strain: Not on file  . Food insecurity:    Worry: Not on file    Inability: Not on file  . Transportation needs:    Medical: Not on file    Non-medical: Not on file  Tobacco Use  . Smoking status: Never Smoker  . Smokeless tobacco: Never Used  Substance and Sexual Activity  . Alcohol use: No    Comment: occ  . Drug use: No    Comment: ACID  . Sexual activity: Yes    Birth control/protection: Other-see comments, Surgical    Comment: Tubal ligation  Lifestyle  . Physical activity:    Days per week: Not on file    Minutes per session: Not on file  . Stress: Not on file  Relationships  . Social connections:    Talks on phone: Not on file    Gets together: Not on file    Attends religious service: Not on file    Active member of club or organization: Not on file    Attends meetings of clubs  or organizations: Not on file    Relationship status: Not on file  . Intimate partner violence:    Fear of current or ex partner: Not on file    Emotionally abused: Not on file    Physically abused: Not on file    Forced sexual activity: Not on file  Other Topics Concern  . Not on file  Social History Narrative  . Not on file    Family history: Kidney disease and heart failure in her father. Emphysema and heart failure in her maternal grandmother.  Physical Exam: BP 124/72 (BP Location: Left Arm)   Pulse 81   Temp 98.2 F (36.8 C) (Oral)   Resp 17   LMP 07/31/2017   Gen: NAD CV: Regular rate Pulm: No increased work of breathing Pelvic: 0.5/20/-2,  unchanged from clinic visit on 11/6 (same examiner) Ext: No signs of DVT  NST: Baseline: 130 Variability: moderate Accelerations: present Decelerations: absent Tocometry: occasional contractions The patient was monitored for 30+ minutes, fetal heart rate tracing was deemed reactive.  Consults: None  Significant Findings/ Diagnostic Studies: labs: UA negative except for some Ca oxalate crystals, urine culture pending  Procedures: SVE, NST  Discharge Condition: good  Disposition: Discharge disposition: 01-Home or Self Care       Diet: Regular diet  Discharge Activity: Activity as tolerated  Discharge Instructions    Discharge activity:  No Restrictions   Complete by:  As directed    Discharge diet:  No restrictions   Complete by:  As directed    Fetal Kick Count:  Lie on our left side for one hour after a meal, and count the number of times your baby kicks.  If it is less than 5 times, get up, move around and drink some juice.  Repeat the test 30 minutes later.  If it is still less than 5 kicks in an hour, notify your doctor.   Complete by:  As directed    LABOR:  When conractions begin, you should start to time them from the beginning of one contraction to the beginning  of the next.  When contractions are 5 - 10 minutes apart or less and have been regular for at least an hour, you should call your health care provider.   Complete by:  As directed    No sexual activity restrictions   Complete by:  As directed    Notify physician for bleeding from the vagina   Complete by:  As directed    Notify physician for blurring of vision or spots before the eyes   Complete by:  As directed    Notify physician for chills or fever   Complete by:  As directed    Notify physician for fainting spells, "black outs" or loss of consciousness   Complete by:  As directed    Notify physician for increase in vaginal discharge   Complete by:  As directed    Notify physician for leaking of  fluid   Complete by:  As directed    Notify physician for pain or burning when urinating   Complete by:  As directed    Notify physician for pelvic pressure (sudden increase)   Complete by:  As directed    Notify physician for severe or continued nausea or vomiting   Complete by:  As directed    Notify physician for sudden gushing of fluid from the vagina (with or without continued leaking)   Complete by:  As directed  Notify physician for sudden, constant, or occasional abdominal pain   Complete by:  As directed    Notify physician if baby moving less than usual   Complete by:  As directed      Allergies as of 05/02/2018      Reactions   Erythromycin Anaphylaxis, Swelling, Nausea And Vomiting      Medication List    STOP taking these medications   cephALEXin 500 MG capsule Commonly known as:  KEFLEX     TAKE these medications   acetaminophen 500 MG tablet Commonly known as:  TYLENOL Take 2 tablets by mouth as needed.   CITRANATAL DHA 27-1 & 250 MG tablet Take 1 tablet by mouth daily.   FUSION 65-65-25-30 MG Caps Take 1 tablet by mouth daily.      Good fetal movement appreciated in triage and reactive NST. Discussed comfort measures for late pregnancy discomforts. Keep scheduled OB visit.  Signed: Oswaldo Conroy, CNM  05/02/2018, 11:27 PM

## 2018-05-02 NOTE — Discharge Summary (Signed)
See Final Progress Note 05/02/2018. 

## 2018-05-02 NOTE — OB Triage Note (Signed)
Pt reports her abd has continued to feel hard and she has not felt baby move for 7-8 hours.  Pt reports numb and tingling in legs for 1-2 days as well as increased pressure in vagina with walking.  Pt reports LOF 2-3 days for a brief period which stopped and increased urinary frequency for 1-2 days.  No bleeding.

## 2018-05-04 ENCOUNTER — Ambulatory Visit (INDEPENDENT_AMBULATORY_CARE_PROVIDER_SITE_OTHER): Payer: Medicaid Other | Admitting: Maternal Newborn

## 2018-05-04 VITALS — BP 120/70 | Wt 188.0 lb

## 2018-05-04 DIAGNOSIS — Z3483 Encounter for supervision of other normal pregnancy, third trimester: Secondary | ICD-10-CM

## 2018-05-04 DIAGNOSIS — Z3A38 38 weeks gestation of pregnancy: Secondary | ICD-10-CM

## 2018-05-04 DIAGNOSIS — Z3480 Encounter for supervision of other normal pregnancy, unspecified trimester: Secondary | ICD-10-CM

## 2018-05-04 LAB — POCT URINALYSIS DIPSTICK OB
GLUCOSE, UA: NEGATIVE
POC,PROTEIN,UA: NEGATIVE

## 2018-05-04 LAB — URINE CULTURE

## 2018-05-04 NOTE — Progress Notes (Signed)
Routine Prenatal Care Visit  Subjective  Vicki Adams is a 22 y.o. G3P2002 at [redacted]w[redacted]d being seen today for ongoing prenatal care.  She is currently monitored for the following issues for this low-risk pregnancy and has History of percutaneous transcatheter closure of congenital ASD; History of esophageal hernia repair; Congenital heart disease, maternal, antepartum; Rh negative state in antepartum period; Supervision of other normal pregnancy, antepartum; History of postpartum depression, currently pregnant; Abdominal pain during pregnancy in second trimester; Antepartum anemia; Irregular uterine contractions; Abdominal pain during pregnancy in third trimester; Bacterial vaginosis in pregnancy; NST (non-stress test) reactive; and Indication for care in labor and delivery, antepartum on their problem list.  ----------------------------------------------------------------------------------- Patient reports occasional contractions. Continues with pelvic girdle pain. Has had some diarrhea associated with contractions yesterday. Contractions: Irregular. Vag. Bleeding: None.  Movement: Present. No leaking of fluid.  ----------------------------------------------------------------------------------- The following portions of the patient's history were reviewed and updated as appropriate: allergies, current medications, past family history, past medical history, past social history, past surgical history and problem list. Problem list updated.   Objective  Blood pressure 120/70, weight 188 lb (85.3 kg), last menstrual period 07/31/2017. Pregravid weight 180 lb (81.6 kg) Total Weight Gain 8 lb (3.629 kg) Body mass index is 29.44 kg/m.   Urinalysis: Protein Negative, Glucose Negative  Fetal Status: Fetal Heart Rate (bpm): 148 Fundal Height: 37 cm Movement: Present     General:  Alert, oriented and cooperative. Patient is in no acute distress.  Skin: Skin is warm and dry. No rash noted.     Cardiovascular: Normal heart rate noted  Respiratory: Normal respiratory effort, no problems with respiration noted  Abdomen: Soft, gravid, appropriate for gestational age. Pain/Pressure: Absent     Pelvic:  Cervical exam deferred        Extremities: Normal range of motion.  Edema: None  Mental Status: Normal mood and affect. Normal behavior. Normal judgment and thought content.     Assessment   22 y.o. [id number] at [redacted]w[redacted]d, EDD 05/14/2018 by Ultrasound presenting for a routine prenatal visit.  Plan   THIRD Problems (from 09/07/17 to present)    Problem Noted Resolved   Antepartum anemia 03/08/2018 by Natale Milch, MD No   Overview Signed 03/08/2018  5:08 PM by Natale Milch, MD    [ ]  recheck at 36 weeks, refr to hematology if there is not an improvement.       Rh negative state in antepartum period 09/07/2017 by Oswaldo Conroy, CNM No   Overview Signed 09/21/2017  4:27 PM by Vena Austria, MD    [ ]  rhogam 28 weeks      Supervision of other normal pregnancy, antepartum 09/07/2017 by Oswaldo Conroy, CNM No   Overview Addendum 03/08/2018  5:07 PM by Natale Milch, MD    Clinic [facility] Prenatal Labs  Dating 6 week [place] Blood type: O/Negative/-- (03/19 1511)   Genetic Screen NIPS: Negative XX     Carrier testing Fragile X, SMA, CF: Antibody:Negative (03/19 1511)  Anatomic [place] Complete 7/16; normal Rubella: 1.65 (03/19 1511) Varicella: Non-immune  GTT Third trimester: 100 RPR: Non Reactive (03/19 1511)   Rhogam [x]  28 weeks HBsAg: Negative (03/19 1511)   TDaP vaccine    03/08/18                   HIV: Non Reactive (03/19 1511)   Baby Food   bottle  GBS:   Contraception Nexplanon Pap: 09/07/2017 NILM  CBB   Maternal history of ASD [x]  fetal echo at 20-24 weeks  CS/VBAC    Support Person              Congenital heart disease, maternal, antepartum 01/23/2016 by Grotegut, [place], MD No   Overview Addendum 01/23/2018  9:22 PM by  Farrel ConnersGutierrez, Colleen, CNM    Hx of ASD repair at age 89 Had a normal echocardiogram 01/2016 which was normal [x ] [facility]etal [facility] 20-[facility] is another [facility] needed?         Term labor symptoms and general obstetric precautions were reviewed.  IOL scheduled for 11/16 at 12 pm.  Marcelyn BruinsJacelyn Schmid, CNM 05/04/2018  10:55 AM

## 2018-05-06 NOTE — Progress Notes (Signed)
Not sure why I got this result instead of you.Marland Kitchen..Marland Kitchen

## 2018-05-07 ENCOUNTER — Encounter: Payer: Self-pay | Admitting: *Deleted

## 2018-05-07 ENCOUNTER — Inpatient Hospital Stay
Admission: EM | Admit: 2018-05-07 | Discharge: 2018-05-10 | DRG: 797 | Disposition: A | Discharge status: Home / Self Care | Payer: Medicaid Other | Attending: Obstetrics and Gynecology | Admitting: Obstetrics and Gynecology

## 2018-05-07 ENCOUNTER — Other Ambulatory Visit: Payer: Self-pay

## 2018-05-07 DIAGNOSIS — O26893 Other specified pregnancy related conditions, third trimester: Secondary | ICD-10-CM | POA: Diagnosis present

## 2018-05-07 DIAGNOSIS — O99019 Anemia complicating pregnancy, unspecified trimester: Secondary | ICD-10-CM

## 2018-05-07 DIAGNOSIS — O9902 Anemia complicating childbirth: Secondary | ICD-10-CM | POA: Diagnosis not present

## 2018-05-07 DIAGNOSIS — D62 Acute posthemorrhagic anemia: Secondary | ICD-10-CM | POA: Diagnosis not present

## 2018-05-07 DIAGNOSIS — Z3A39 39 weeks gestation of pregnancy: Secondary | ICD-10-CM

## 2018-05-07 DIAGNOSIS — O99824 Streptococcus B carrier state complicating childbirth: Secondary | ICD-10-CM | POA: Diagnosis present

## 2018-05-07 DIAGNOSIS — O9081 Anemia of the puerperium: Secondary | ICD-10-CM | POA: Diagnosis not present

## 2018-05-07 DIAGNOSIS — O26899 Other specified pregnancy related conditions, unspecified trimester: Secondary | ICD-10-CM

## 2018-05-07 DIAGNOSIS — Z3490 Encounter for supervision of normal pregnancy, unspecified, unspecified trimester: Secondary | ICD-10-CM | POA: Diagnosis present

## 2018-05-07 DIAGNOSIS — Z6791 Unspecified blood type, Rh negative: Secondary | ICD-10-CM | POA: Diagnosis not present

## 2018-05-07 DIAGNOSIS — Q249 Congenital malformation of heart, unspecified: Secondary | ICD-10-CM

## 2018-05-07 DIAGNOSIS — Z302 Encounter for sterilization: Secondary | ICD-10-CM

## 2018-05-07 DIAGNOSIS — O99419 Diseases of the circulatory system complicating pregnancy, unspecified trimester: Secondary | ICD-10-CM

## 2018-05-07 DIAGNOSIS — Z3480 Encounter for supervision of other normal pregnancy, unspecified trimester: Secondary | ICD-10-CM

## 2018-05-07 LAB — CBC
HCT: 36.2 % (ref 36.0–46.0)
HEMOGLOBIN: 11.6 g/dL — AB (ref 12.0–15.0)
MCH: 24.9 pg — ABNORMAL LOW (ref 26.0–34.0)
MCHC: 32 g/dL (ref 30.0–36.0)
MCV: 77.7 fL — ABNORMAL LOW (ref 80.0–100.0)
Platelets: 190 10*3/uL (ref 150–400)
RBC: 4.66 MIL/uL (ref 3.87–5.11)
RDW: 19.9 % — ABNORMAL HIGH (ref 11.5–15.5)
WBC: 9.7 10*3/uL (ref 4.0–10.5)
nRBC: 0 % (ref 0.0–0.2)

## 2018-05-07 LAB — URINE DRUG SCREEN, QUALITATIVE (ARMC ONLY)
AMPHETAMINES, UR SCREEN: NOT DETECTED
BENZODIAZEPINE, UR SCRN: NOT DETECTED
Barbiturates, Ur Screen: NOT DETECTED
Cannabinoid 50 Ng, Ur ~~LOC~~: NOT DETECTED
Cocaine Metabolite,Ur ~~LOC~~: NOT DETECTED
MDMA (Ecstasy)Ur Screen: NOT DETECTED
Methadone Scn, Ur: NOT DETECTED
OPIATE, UR SCREEN: NOT DETECTED
PHENCYCLIDINE (PCP) UR S: NOT DETECTED
Tricyclic, Ur Screen: NOT DETECTED

## 2018-05-07 LAB — CHLAMYDIA/NGC RT PCR (ARMC ONLY)
CHLAMYDIA TR: NOT DETECTED
N gonorrhoeae: NOT DETECTED

## 2018-05-07 MED ORDER — OXYTOCIN 40 UNITS IN LACTATED RINGERS INFUSION - SIMPLE MED
1.0000 m[IU]/min | INTRAVENOUS | Status: DC
  Filled 2018-05-07: qty 1000

## 2018-05-07 MED ORDER — TERBUTALINE SULFATE 1 MG/ML IJ SOLN: 0.2500 mg | Freq: Once | INTRAMUSCULAR | Status: DC | PRN

## 2018-05-07 MED ORDER — SODIUM CHLORIDE 0.9 % IV SOLN
5.0000 10*6.[IU] | Freq: Once | INTRAVENOUS | Status: AC
  Administered 2018-05-07: 5 10*6.[IU] via INTRAVENOUS
  Filled 2018-05-07: qty 5

## 2018-05-07 MED ORDER — LIDOCAINE HCL (PF) 1 % IJ SOLN
INTRAMUSCULAR | Status: AC
  Filled 2018-05-07: qty 30

## 2018-05-07 MED ORDER — AMMONIA AROMATIC IN INHA
RESPIRATORY_TRACT | Status: AC
  Filled 2018-05-07: qty 10

## 2018-05-07 MED ORDER — LACTATED RINGERS IV SOLN
INTRAVENOUS | Status: DC
  Administered 2018-05-07: 13:00:00 via INTRAVENOUS

## 2018-05-07 MED ORDER — OXYTOCIN 10 UNIT/ML IJ SOLN
INTRAMUSCULAR | Status: AC
  Filled 2018-05-07: qty 2

## 2018-05-07 MED ORDER — ACETAMINOPHEN 325 MG PO TABS: 650.0000 mg | ORAL_TABLET | ORAL | Status: DC | PRN

## 2018-05-07 MED ORDER — PENICILLIN G 3 MILLION UNITS IVPB - SIMPLE MED
3.0000 10*6.[IU] | INTRAVENOUS | Status: DC
  Administered 2018-05-07 – 2018-05-08 (×2): 3 10*6.[IU] via INTRAVENOUS
  Filled 2018-05-07 (×2): qty 100
  Filled 2018-05-07: qty 3
  Filled 2018-05-07 (×4): qty 100

## 2018-05-07 MED ORDER — OXYTOCIN 40 UNITS IN LACTATED RINGERS INFUSION - SIMPLE MED: 2.5000 [IU]/h | INTRAVENOUS | Status: DC

## 2018-05-07 MED ORDER — MISOPROSTOL 25 MCG QUARTER TABLET
25.0000 ug | ORAL_TABLET | ORAL | Status: DC | PRN
Start: 2018-05-07 — End: 2018-05-08
  Administered 2018-05-07 (×2): 25 ug via VAGINAL
  Filled 2018-05-07 (×2): qty 1

## 2018-05-07 MED ORDER — OXYTOCIN BOLUS FROM INFUSION
500.0000 mL | Freq: Once | INTRAVENOUS | Status: DC
  Administered 2018-05-08: 500 mL via INTRAVENOUS

## 2018-05-07 MED ORDER — FENTANYL CITRATE (PF) 100 MCG/2ML IJ SOLN
50.0000 ug | INTRAMUSCULAR | Status: DC | PRN
  Administered 2018-05-07 (×3): 50 ug via INTRAVENOUS
  Administered 2018-05-08: 100 ug via INTRAVENOUS
  Filled 2018-05-07 (×2): qty 2

## 2018-05-07 MED ORDER — ONDANSETRON HCL 4 MG/2ML IJ SOLN
4.0000 mg | Freq: Four times a day (QID) | INTRAMUSCULAR | Status: DC | PRN
Start: 2018-05-07 — End: 2018-05-08
  Administered 2018-05-08: 4 mg via INTRAVENOUS
  Filled 2018-05-07: qty 2

## 2018-05-07 MED ORDER — LACTATED RINGERS IV SOLN
500.0000 mL | INTRAVENOUS | Status: DC | PRN
  Administered 2018-05-08: 500 mL via INTRAVENOUS

## 2018-05-07 MED ORDER — MISOPROSTOL 200 MCG PO TABS
ORAL_TABLET | ORAL | Status: AC
  Filled 2018-05-07: qty 4

## 2018-05-07 NOTE — Progress Notes (Signed)
  Labor Progress Note   22 y.o. [name] @ 511w0d , admitted for  Pregnancy, Labor Management. Elective Induction of Labor   Subjective:  Uncomfortable with contractions, coping by breathing through them. Feeling pain mostly in her hips. Tried nitrous oxide and it did not help. Requesting IV pain medication.  Objective:  BP (!) 94/54 (BP Location: Right Arm)   Pulse 68   Temp 98.2 F (36.8 C) (Oral)   Resp 16   Ht 5\' 7"  (1.702 m)   Wt 85.3 kg   LMP 07/31/2017   SpO2 99%   BMI 29.44 kg/m  Abd: gravid, non-tender Extr: trace to 1+ bilateral pedal edema SVE: 2.5/60/-2  EFM: FHR: 135 bpm, variability: moderate,  accelerations:  Present,  decelerations:  Absent Toco: Frequency: Every 1-2.5  minutes, Duration: 40-80 seconds and Intensity: mild Labs: I have reviewed the patient's lab results.   Assessment & Plan:  [name] @ 451w0d, admitted for  Pregnancy and Labor/Delivery Management, Elective IOL  1. Pain management: IV sedation. 2. FWB: FHT category I.  3. ID: GBS positive, received PCN x 2 4. Labor management: Contracting frequently after second dose of Cytotec. Consider Pitocin if contractions space out. AROM when feasible.  All discussed with patient.  Marcelyn BruinsJacelyn Heyli Min, CNM 05/07/2018  9:25 PM

## 2018-05-07 NOTE — OB Triage Note (Signed)
Patient to LDR 1 for elective induction. No complaints at this time. Patient taking shower before induction begins.  J.Schmid CNM notified of patient arrival.

## 2018-05-07 NOTE — H&P (Signed)
Obstetrics Admission History & Physical   Encounter for planned induction of labor   HPI:  22 y.o. [id number] @ [redacted]w[redacted]d (05/14/2018, by Ultrasound). Admitted on 05/07/2018:   Patient Active Problem List   Diagnosis Date Noted  . Encounter for planned induction of labor 05/07/2018  . NST (non-stress test) reactive 04/22/2018  . Bacterial vaginosis in pregnancy 04/15/2018  . Abdominal pain during pregnancy in third trimester 03/24/2018  . Irregular uterine contractions 03/23/2018  . Antepartum anemia 03/08/2018  . Abdominal pain during pregnancy in second trimester 01/18/2018  . History of postpartum depression, currently pregnant 10/19/2017  . Rh negative state in antepartum period 09/07/2017  . Supervision of other normal pregnancy, antepartum 09/07/2017  . Congenital heart disease, maternal, antepartum 01/23/2016  . History of percutaneous transcatheter closure of congenital ASD 01/24/2015  . History of esophageal hernia repair 01/24/2015     Presents for planned elective induction of labor at term.   Prenatal care at: at [facility]. Pregnancy complicated by Group B strep, anemia.  ROS: A review of systems was performed and negative, except as stated in the above HPI. PMHx:  Past Medical History:  Diagnosis Date  . Anemia   . Secundum ASD    a. 11/2000 s/p closure @ [facility]; b. 04/2015 Echo: Nl LV/RV fxn. Mild PR, triv TR. Stable ASD occluder; b. 01/2016 Echo: EF 60-65%, no rwma.  . Syncope and collapse    a. With second pregnancy-->02/2016 Holter: sinus rhythm, elevated HR likely 2/2 pregnancy. No arrhythmias.   PSHx:  Past Surgical History:  Procedure Laterality Date  . CARDIAC SURGERY  11/2000  . HERNIA REPAIR  04/08/1996   congential diaphragmatic hernia repair done at 70 weeks of age at [facility]   Medications:  Medications Prior to Admission  Medication Sig Dispense Refill Last Dose  . acetaminophen (TYLENOL) 500 MG tablet Take 2 tablets by mouth as needed.   Past Month at Unknown time   . Fe Fum-Fe Poly-Vit C-Lactobac (FUSION) 65-65-25-30 MG CAPS Take 1 tablet by mouth daily. 30 capsule 5 Past Week at Unknown time  . Prenat w/o A-FeCbGl-DSS-FA-DHA (CITRANATAL DHA) 27-1 & 250 MG tablet Take 1 tablet by mouth daily. 30 tablet 11 Past Month at Unknown time   Allergies: is allergic to erythromycin. OBHx:  OB History  Gravida Para Term Preterm AB Living  3 2 2  0 0 2  SAB TAB Ectopic Multiple Live Births  0 0 0 0 2    # Outcome Date GA Lbr Len/2nd Weight Sex Delivery Anes PTL Lv  3 Current           2 Term 05/12/16 [redacted]w[redacted]d / 00:08 3510 g M Vag-Spont None  LIV  1 Term 06/22/15 [redacted]w[redacted]d  3180 g F Vag-Spont None  LIV   AVW:UJWJX than listed in HPI remarkable for: Kidney disease and heart failure in her father and heart failure  in her maternal grandmother.  No family history of birth defects. Soc Hx: Never smoker, Alcohol - none and substance use, former, LSD.  Objective:   Vitals:   05/07/18 1329  BP: 117/70  Pulse: 88  Resp: 16  Temp: 97.9 F (36.6 C)   Constitutional: Well nourished, well developed female in no acute distress.  HEENT: normal Skin: Warm and dry.  Cardiovascular: Regular rate and rhythm.   Extremity: trace to 1+ bilateral pedal edema Respiratory: Clear to auscultation bilaterally. Normal respiratory effort Abdomen: gravid, non-tender Neuro: Cranial nerves grossly intact Psych: Alert and Oriented x3. No memory deficits.  Normal mood and affect.  MS: normal gait, normal bilateral lower extremity ROM/strength/stability.  Cervix: 0.5/40/-2 to -3 (RN exam)  EFM:FHR: 145 bpm, variability: moderate,  accelerations:  Present,  decelerations:  Absent Toco: Occasional contractions   Perinatal info:  Blood type: O negative Rubella - Immune Varicella - Not immune TDaP Given during third trimester of this pregnancy on 03/08/2018 RPR NR / HIV Neg/ HBsAg Neg   Assessment & Plan:   22 y.o. [name] @ 6871w0d, Admitted on 05/07/2018 for elective induction of  labor at term. Discussed use of Cytotec for cervical ripening before beginning Pitocin. She agrees to this plan.    Admit for labor, Antibiotics for GBS prophylaxis, Observe for cervical change, Fetal Wellbeing Reassuring, Epidural when ready and AROM when Appropriate  Marcelyn BruinsJacelyn Saharra Santo, CNM [facility], [facility] 05/07/2018  2:07 PM

## 2018-05-08 ENCOUNTER — Inpatient Hospital Stay: Payer: Medicaid Other | Admitting: Anesthesiology

## 2018-05-08 DIAGNOSIS — O99824 Streptococcus B carrier state complicating childbirth: Secondary | ICD-10-CM

## 2018-05-08 DIAGNOSIS — Z3A39 39 weeks gestation of pregnancy: Secondary | ICD-10-CM

## 2018-05-08 DIAGNOSIS — O9902 Anemia complicating childbirth: Secondary | ICD-10-CM

## 2018-05-08 LAB — CBC
HCT: 34.5 % — ABNORMAL LOW (ref 36.0–46.0)
HEMOGLOBIN: 10.9 g/dL — AB (ref 12.0–15.0)
MCH: 25.2 pg — ABNORMAL LOW (ref 26.0–34.0)
MCHC: 31.6 g/dL (ref 30.0–36.0)
MCV: 79.7 fL — AB (ref 80.0–100.0)
NRBC: 0 % (ref 0.0–0.2)
Platelets: 190 10*3/uL (ref 150–400)
RBC: 4.33 MIL/uL (ref 3.87–5.11)
RDW: 19.8 % — ABNORMAL HIGH (ref 11.5–15.5)
WBC: 13.8 10*3/uL — AB (ref 4.0–10.5)

## 2018-05-08 MED ORDER — SODIUM CHLORIDE 0.9% FLUSH
10.0000 mL | Freq: Three times a day (TID) | INTRAVENOUS | Status: DC
  Administered 2018-05-08 – 2018-05-09 (×2): 10 mL via INTRAVENOUS

## 2018-05-08 MED ORDER — DIBUCAINE 1 % RE OINT: 1.0000 "application " | TOPICAL_OINTMENT | RECTAL | Status: DC | PRN

## 2018-05-08 MED ORDER — SIMETHICONE 80 MG PO CHEW
80.0000 mg | CHEWABLE_TABLET | ORAL | Status: DC | PRN
  Administered 2018-05-09 – 2018-05-10 (×3): 80 mg via ORAL
  Filled 2018-05-08 (×3): qty 1

## 2018-05-08 MED ORDER — FENTANYL 2.5 MCG/ML W/ROPIVACAINE 0.15% IN NS 100 ML EPIDURAL (ARMC)
EPIDURAL | Status: AC
  Filled 2018-05-08: qty 100

## 2018-05-08 MED ORDER — DIPHENHYDRAMINE HCL 50 MG/ML IJ SOLN: 12.5000 mg | INTRAMUSCULAR | Status: DC | PRN

## 2018-05-08 MED ORDER — LIDOCAINE HCL (PF) 1 % IJ SOLN
INTRAMUSCULAR | Status: DC | PRN
  Administered 2018-05-08: 4 mL via INTRADERMAL

## 2018-05-08 MED ORDER — EPHEDRINE 5 MG/ML INJ
10.0000 mg | INTRAVENOUS | Status: DC | PRN
  Filled 2018-05-08: qty 2

## 2018-05-08 MED ORDER — FAMOTIDINE 20 MG PO TABS
40.0000 mg | ORAL_TABLET | Freq: Once | ORAL | Status: AC
  Administered 2018-05-09: 40 mg via ORAL
  Filled 2018-05-08: qty 2

## 2018-05-08 MED ORDER — METOCLOPRAMIDE HCL 10 MG PO TABS
10.0000 mg | ORAL_TABLET | Freq: Once | ORAL | Status: AC
  Administered 2018-05-09: 10 mg via ORAL
  Filled 2018-05-08: qty 1

## 2018-05-08 MED ORDER — PHENYLEPHRINE 40 MCG/ML (10ML) SYRINGE FOR IV PUSH (FOR BLOOD PRESSURE SUPPORT)
80.0000 ug | PREFILLED_SYRINGE | INTRAVENOUS | Status: DC | PRN
  Filled 2018-05-08: qty 5

## 2018-05-08 MED ORDER — OXYCODONE-ACETAMINOPHEN 5-325 MG PO TABS
2.0000 | ORAL_TABLET | ORAL | Status: DC | PRN
  Administered 2018-05-08 – 2018-05-09 (×2): 2 via ORAL
  Filled 2018-05-08: qty 2

## 2018-05-08 MED ORDER — COCONUT OIL OIL: 1.0000 "application " | TOPICAL_OIL | Status: DC | PRN

## 2018-05-08 MED ORDER — LIDOCAINE-EPINEPHRINE (PF) 1.5 %-1:200000 IJ SOLN
INTRAMUSCULAR | Status: DC | PRN
  Administered 2018-05-08: 4 mL via PERINEURAL

## 2018-05-08 MED ORDER — VARICELLA VIRUS VACCINE LIVE 1350 PFU/0.5ML IJ SUSR: 0.5000 mL | INTRAMUSCULAR | Status: DC | PRN

## 2018-05-08 MED ORDER — PRENATAL MULTIVITAMIN CH
1.0000 | ORAL_TABLET | Freq: Every day | ORAL | Status: DC
  Administered 2018-05-08: 1 via ORAL
  Filled 2018-05-08: qty 1

## 2018-05-08 MED ORDER — FENTANYL 2.5 MCG/ML W/ROPIVACAINE 0.15% IN NS 100 ML EPIDURAL (ARMC): 12.0000 mL/h | EPIDURAL | Status: DC

## 2018-05-08 MED ORDER — BUPIVACAINE HCL (PF) 0.25 % IJ SOLN
INTRAMUSCULAR | Status: DC | PRN
  Administered 2018-05-08 (×2): 4 mL via EPIDURAL

## 2018-05-08 MED ORDER — ACETAMINOPHEN 325 MG PO TABS: 650.0000 mg | ORAL_TABLET | ORAL | Status: DC | PRN

## 2018-05-08 MED ORDER — FENTANYL 2.5 MCG/ML W/ROPIVACAINE 0.15% IN NS 100 ML EPIDURAL (ARMC)
EPIDURAL | Status: DC | PRN
  Administered 2018-05-08: 12 mL/h via EPIDURAL

## 2018-05-08 MED ORDER — OXYCODONE-ACETAMINOPHEN 5-325 MG PO TABS
1.0000 | ORAL_TABLET | ORAL | Status: DC | PRN
  Administered 2018-05-08 – 2018-05-10 (×2): 1 via ORAL
  Filled 2018-05-08 (×2): qty 1

## 2018-05-08 MED ORDER — ONDANSETRON HCL 4 MG/2ML IJ SOLN: 4.0000 mg | INTRAMUSCULAR | Status: DC | PRN

## 2018-05-08 MED ORDER — LACTATED RINGERS IV SOLN
INTRAVENOUS | Status: DC
  Administered 2018-05-09: 10:00:00 via INTRAVENOUS

## 2018-05-08 MED ORDER — BENZOCAINE-MENTHOL 20-0.5 % EX AERO: 1.0000 "application " | INHALATION_SPRAY | CUTANEOUS | Status: DC | PRN

## 2018-05-08 MED ORDER — LACTATED RINGERS IV SOLN: 500.0000 mL | Freq: Once | INTRAVENOUS | Status: DC

## 2018-05-08 MED ORDER — WITCH HAZEL-GLYCERIN EX PADS: 1.0000 "application " | MEDICATED_PAD | CUTANEOUS | Status: DC | PRN

## 2018-05-08 MED ORDER — IBUPROFEN 600 MG PO TABS
600.0000 mg | ORAL_TABLET | Freq: Four times a day (QID) | ORAL | Status: DC
  Administered 2018-05-08 – 2018-05-10 (×8): 600 mg via ORAL
  Filled 2018-05-08 (×8): qty 1

## 2018-05-08 MED ORDER — SENNOSIDES-DOCUSATE SODIUM 8.6-50 MG PO TABS
2.0000 | ORAL_TABLET | ORAL | Status: DC
  Administered 2018-05-08 – 2018-05-09 (×2): 2 via ORAL
  Filled 2018-05-08 (×2): qty 2

## 2018-05-08 MED ORDER — DIPHENHYDRAMINE HCL 25 MG PO CAPS: 25.0000 mg | ORAL_CAPSULE | Freq: Four times a day (QID) | ORAL | Status: DC | PRN

## 2018-05-08 MED ORDER — ONDANSETRON HCL 4 MG PO TABS: 4.0000 mg | ORAL_TABLET | ORAL | Status: DC | PRN

## 2018-05-08 NOTE — Discharge Summary (Signed)
OB Discharge Summary     Patient Name: Vicki PoseyCheyenne Nicole Adams DOB: 11/01/1995 MRN: [id number]  Date of admission: 05/07/2018 Delivering Provider: Oswaldo ConroyJacelyn Y Pecolia Marando, CNM  Date of Delivery: 05/08/2018  Date of discharge: 05/10/2018  Admitting diagnosis: Encounter for planned induction of labor Intrauterine pregnancy: 7032w1d     Secondary diagnosis: Anemia     Discharge diagnosis: Term Pregnancy Delivered, Bilateral tubal ligation                         Hospital course:  Induction of Labor With Vaginal Delivery   22 y.o. yo G3P2002 at 7532w1d was admitted to the hospital 05/07/2018 for induction of labor.  Indication for induction: Elective.  Patient had an uncomplicated labor course as follows: Membrane Rupture Time/Date: 12:12 AM ,05/08/2018   Intrapartum Procedures: Episiotomy: None [1]                                         Lacerations:  None [1]  Patient had delivery of a Viable infant.  Information for the patient's newborn:  Rexene Edisonorter, Girl Danaye [[id number]][[id number]]  Delivery Method: Vag-Spont   05/08/2018  Details of delivery can be found in separate delivery note. She had a bilateral tubal ligation on 05/09/2018 without complications. She had a routine postpartum course. Patient is discharged home 05/10/18.                                                                 Post partum procedures: postpartum tubal ligation  Complications: None  Physical exam on 05/10/2018: Vitals:   05/09/18 2339 05/10/18 0334 05/10/18 0336 05/10/18 0840  BP: 113/79 (!) 86/47 110/71 114/75  Pulse: 91 71 60 64  Resp: 18 18  20   Temp: 98.1 F (36.7 C) 98.1 F (36.7 C)  97.8 F (36.6 C)  TempSrc: Oral Oral  Oral  SpO2: 98% 98%  99%  Weight:      Height:       General: alert, cooperative and no distress Lochia: appropriate Uterine Fundus: firm Incision: Healing well with no significant drainage DVT Evaluation: No evidence of DVT seen on physical exam.  Labs: Lab Results  Component Value  Date   WBC 10.4 05/10/2018   HGB 10.3 (L) 05/10/2018   HCT 32.7 (L) 05/10/2018   MCV 79.8 (L) 05/10/2018   PLT 182 05/10/2018   CMP Latest Ref Rng & Units 02/15/2018  Glucose 65 - 99 mg/dL 73  BUN 6 - 20 mg/dL 5(L)  Creatinine 6.960.57 - 1.00 mg/dL 2.950.63  Sodium 284134 - 132144 mmol/L 138  Potassium 3.5 - 5.2 mmol/L 4.0  Chloride 96 - 106 mmol/L 103  CO2 20 - 29 mmol/L 22  Calcium 8.7 - 10.2 mg/dL 8.7  Total Protein 6.0 - 8.5 g/dL 6.0  Total Bilirubin 0.0 - 1.2 mg/dL 0.2  Alkaline Phos 39 - 117 IU/L 64  AST 0 - 40 IU/L 12  ALT 0 - 32 IU/L 6    Discharge instruction: per After Visit Summary.  Medications:  Allergies as of 05/10/2018      Reactions   Erythromycin Anaphylaxis, Swelling, Nausea And Vomiting  Medication List    STOP taking these medications   acetaminophen 500 MG tablet Commonly known as:  TYLENOL   FUSION 65-65-25-30 MG Caps     TAKE these medications   CITRANATAL DHA 27-1 & 250 MG tablet Take 1 tablet by mouth daily.   Docusate Sodium 100 MG capsule Take 1 tablet (100 mg total) by mouth 2 (two) times daily.   oxyCODONE-acetaminophen 5-325 MG tablet Commonly known as:  PERCOCET/ROXICET Take 1-2 tablets by mouth every 6 (six) hours as needed.       Diet: No evidence of DVT seen on physical exam.  Activity: Advance as tolerated. Pelvic rest for 6 weeks.   Outpatient follow up: Follow-up Information    Oswaldo Conroy, CNM. Schedule an appointment as soon as possible for a visit in 6 week(s).   Specialty:  Certified Nurse Midwife Contact information: [address] [place] [address] [phone]             Postpartum contraception: Tubal Ligation Rhogam Given postpartum: yes Rubella vaccine given postpartum: no Varicella vaccine given postpartum: Ordered TDaP given antepartum or postpartum: Yes, 03/08/2018  Newborn Data: Live born female  Birth Weight:  7 lb, 1 oz. APGAR: 8, 9  Newborn Delivery   Birth date/time:   05/08/2018 03:12:00 Delivery type:  Vaginal, Spontaneous     Baby Feeding: Formula  Disposition: home with mother  SIGNED:  Oswaldo Conroy, CNM 05/10/2018 9:21 AM

## 2018-05-08 NOTE — Anesthesia Postprocedure Evaluation (Signed)
Anesthesia Post Note  Patient: Vicki Adams  Procedure(s) Performed: AN AD HOC LABOR EPIDURAL  Patient location during evaluation: Mother Baby Anesthesia Type: Epidural Level of consciousness: awake and alert Pain management: pain level controlled Vital Signs Assessment: post-procedure vital signs reviewed and stable Respiratory status: spontaneous breathing, nonlabored ventilation and respiratory function stable Cardiovascular status: stable Postop Assessment: no headache, no backache and epidural receding Anesthetic complications: no     Last Vitals:  Vitals:   05/08/18 0632 05/08/18 0741  BP: 106/63 113/66  Pulse: 66 65  Resp: 20 18  Temp: 36.7 C 36.8 C  SpO2: 98% 98%    Last Pain:  Vitals:   05/08/18 0830  TempSrc:   PainSc: 2                  Yevette EdwardsJames G Maryland Luppino

## 2018-05-08 NOTE — Anesthesia Procedure Notes (Signed)
Epidural Patient location during procedure: OB  Staffing Performed: anesthesiologist   Preanesthetic Checklist Completed: patient identified, site marked, surgical consent, pre-op evaluation, timeout performed, IV checked, risks and benefits discussed and monitors and equipment checked  Epidural Patient position: sitting Prep: Betadine Patient monitoring: heart rate, continuous pulse ox and blood pressure Approach: midline Location: L3-L4 Injection technique: LOR saline  Needle:  Needle type: Tuohy  Needle gauge: 17 G Needle length: 9 cm and 9 Needle insertion depth: 7 cm Catheter type: closed end flexible Catheter size: 20 Guage Catheter at skin depth: 12 cm Test dose: negative and 1.5% lidocaine with Epi 1:200 K  Assessment Sensory level: T10 Events: blood not aspirated, injection not painful, no injection resistance, negative IV test and no paresthesia  Additional Notes Procedure discussed with patient.  She voiced understanding and consented.  She was also very challenging to communicate with during placement and unwilling to voice responses to questions and inappropriate with language and behavior. Ultlimately I was able to place the catheter quickly.    Patient tolerated the insertion well without complications.-SATD -IVTD. No paresthesia. [facility] for VS and dosingReason for block:procedure for pain

## 2018-05-08 NOTE — Plan of Care (Signed)
Pt. Transferred to room 335 Postpartum. Alert and oriented with positive affect. Color good, skin w&d. BBS clear. Fundus firm at U/E, small lochia flow. Assisted up to BR and voided 300cc of clear urine. 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 Pt. V/O.

## 2018-05-08 NOTE — Anesthesia Preprocedure Evaluation (Signed)
Anesthesia Evaluation  Patient identified by MRN, date of birth, ID band Patient awake    Reviewed: Allergy & Precautions, H&P , NPO status , Patient's Chart, lab work & pertinent test results, reviewed documented beta blocker date and time   Airway Mallampati: II  TM Distance: >3 FB Neck ROM: full    Dental no notable dental hx. (+) Teeth Intact   Pulmonary neg pulmonary ROS, Current Smoker,    Pulmonary exam normal breath sounds clear to auscultation       Cardiovascular Exercise Tolerance: Good negative cardio ROS   Rhythm:regular Rate:Normal     Neuro/Psych negative neurological ROS  negative psych ROS   GI/Hepatic negative GI ROS, Neg liver ROS,   Endo/Other  negative endocrine ROSdiabetes  Renal/GU      Musculoskeletal   Abdominal   Peds  Hematology  (+) Blood dyscrasia, anemia ,   Anesthesia Other Findings   Reproductive/Obstetrics (+) Pregnancy                             Anesthesia Physical Anesthesia Plan  ASA: II  Anesthesia Plan: Epidural   Post-op Pain Management:    Induction:   PONV Risk Score and Plan:   Airway Management Planned:   Additional Equipment:   Intra-op Plan:   Post-operative Plan:   Informed Consent: I have reviewed the patients History and Physical, chart, labs and discussed the procedure including the risks, benefits and alternatives for the proposed anesthesia with the patient or authorized representative who has indicated his/her understanding and acceptance.       Plan Discussed with:   Anesthesia Plan Comments:         Anesthesia Quick Evaluation  

## 2018-05-08 NOTE — Progress Notes (Signed)
Subjective:  Doing well no concerns. Tolerating po.  Moderate lochia.  No fevers, no chills.    Objective:  Vital signs in last 24 hours: Temp:  [97.7 F (36.5 C)-98.4 F (36.9 C)] 98.2 F (36.8 C) (11/17 0741) Pulse Rate:  [58-181] 65 (11/17 0741) Resp:  [14-20] 18 (11/17 0741) BP: (88-136)/(44-85) 113/66 (11/17 0741) SpO2:  [98 %-100 %] 98 % (11/17 0741) Weight:  [85.3 kg] 85.3 kg (11/16 1329)    General: NAD Pulmonary: no increased work of breathing Abdomen: non-distended, non-tender, fundus firm at level of umbilicus Extremities: no edema, no erythema, no tenderness  Results for orders placed or performed during the hospital encounter of 05/07/18 (from the past 72 hour(s))  CBC     Status: Abnormal   Collection Time: 05/07/18  1:10 PM  Result Value Ref Range   WBC 9.7 4.0 - 10.5 K/uL   RBC 4.66 3.87 - 5.11 MIL/uL   Hemoglobin 11.6 (L) 12.0 - 15.0 g/dL   HCT 36.2 36.0 - 46.0 %   MCV 77.7 (L) 80.0 - 100.0 fL   MCH 24.9 (L) 26.0 - 34.0 pg   MCHC 32.0 30.0 - 36.0 g/dL   RDW 19.9 (H) 11.5 - 15.5 %   Platelets 190 150 - 400 K/uL   nRBC 0.0 0.0 - 0.2 %    Comment: Performed at [facility], [address] [place], [place] [address]  Type and screen [place]     Status: None (Preliminary result)   Collection Time: 05/07/18  1:10 PM  Result Value Ref Range   ABO/RH(D) O NEG    Antibody Screen POS    Sample Expiration 05/10/2018    Antibody Identification PASSIVELY ACQUIRED ANTI-D    Unit Number [id number]    Blood Component Type RED CELLS,LR    Unit division 00    Status of Unit ALLOCATED    Transfusion Status OK TO TRANSFUSE    Crossmatch Result      COMPATIBLE Performed at [facility], [address], [place], [place] [address]    Unit Number [id number]    Blood Component Type RED CELLS,LR    Unit division 00    Status of Unit ALLOCATED    Transfusion Status OK TO TRANSFUSE    Crossmatch Result COMPATIBLE     Urine Drug Screen, Qualitative ([facility] only)     Status: None   Collection Time: 05/07/18  1:10 PM  Result Value Ref Range   Tricyclic, Ur Screen NONE DETECTED NONE DETECTED   Amphetamines, Ur Screen NONE DETECTED NONE DETECTED   MDMA (Ecstasy)Ur Screen NONE DETECTED NONE DETECTED   Cocaine Metabolite,Ur [place] NONE DETECTED NONE DETECTED   Opiate, Ur Screen NONE DETECTED NONE DETECTED   Phencyclidine (PCP) Ur S NONE DETECTED NONE DETECTED   Cannabinoid 50 Ng, Ur [place] NONE DETECTED NONE DETECTED   Barbiturates, Ur Screen NONE DETECTED NONE DETECTED   Benzodiazepine, Ur Scrn NONE DETECTED NONE DETECTED   Methadone Scn, Ur NONE DETECTED NONE DETECTED    Comment: (NOTE) Tricyclics + metabolites, urine    Cutoff 1000 ng/mL Amphetamines + metabolites, urine  Cutoff 1000 ng/mL MDMA (Ecstasy), urine              Cutoff 500 ng/mL Cocaine Metabolite, urine          Cutoff 300 ng/mL Opiate + metabolites, urine        Cutoff 300 ng/mL Phencyclidine (PCP), urine  Cutoff 25 ng/mL Cannabinoid, urine                 Cutoff 50 ng/mL Barbiturates + metabolites, urine  Cutoff 200 ng/mL Benzodiazepine, urine              Cutoff 200 ng/mL Methadone, urine                   Cutoff 300 ng/mL The urine drug screen provides only a preliminary, unconfirmed analytical test result and should not be used for non-medical purposes. Clinical consideration and professional judgment should be applied to any positive drug screen result due to possible interfering substances. A more specific alternate chemical method must be used in order to obtain a confirmed analytical result. Gas chromatography / mass spectrometry (GC/MS) is the preferred confirmat ory method. Performed at [facility], [address] [place], [place] [address]   [place] rt PCR [facility] only)     Status: None   Collection Time: 05/07/18  1:10 PM  Result Value Ref Range   Specimen source GC/Chlam URINE, RANDOM    Chlamydia Tr  NOT DETECTED NOT DETECTED   N gonorrhoeae NOT DETECTED NOT DETECTED    Comment: (NOTE) This CT/NG assay has not been evaluated in patients with a history of  hysterectomy. Performed at [facility], [address] [place], [place] [address]   CBC     Status: Abnormal   Collection Time: 05/08/18  7:19 AM  Result Value Ref Range   WBC 13.8 (H) 4.0 - 10.5 K/uL   RBC 4.33 3.87 - 5.11 MIL/uL   Hemoglobin 10.9 (L) 12.0 - 15.0 g/dL   HCT 34.5 (L) 36.0 - 46.0 %   MCV 79.7 (L) 80.0 - 100.0 fL   MCH 25.2 (L) 26.0 - 34.0 pg   MCHC 31.6 30.0 - 36.0 g/dL   RDW 19.8 (H) 11.5 - 15.5 %   Platelets 190 150 - 400 K/uL   nRBC 0.0 0.0 - 0.2 %    Comment: Performed at [facility], [address] [place], [place] [address]    Assessment:   22 y.o. [phone] postpartum day # 0 TSVD  Plan:    1) Acute blood loss anemia - hemodynamically stable and asymptomatic - po ferrous sulfate  2) Blood Type --/--/O NEG (11/16 1310) / Rubella 1.65 (03/19 1511) / Varicella Non-Immune  - varivax at discharge -  Rhogam indicated Information for the patient's newborn:  Aziza, Stuck[place] [[id number]]  O POS   3) TDAP status up to date Immunization History  Administered Date(s) Administered  . Influenza-Unspecified 09/07/2017  . MMR 06/24/2015  . Rho (D) Immune Globulin 02/22/2018  . Tdap 03/08/2018  . Varicella 06/24/2015    4) Feeding plan bottle  5)  Education given regarding options for contraception, as well as compatibility with breast feeding if applicable.  Patient plans on tubal ligation for contraception. 22 y.o. [id number]  with undesired fertility, desires permanent sterilization.  Other reversible forms of contraception were discussed with patient; she declines all other modalities. Permanent nature of as well as associated risks of the procedure discussed with patient including but not limited to: risk of regret, permanence of method, bleeding, infection, injury to  surrounding organs and need for additional procedures.  Failure risk of 0.5-1% with increased risk of ectopic gestation if pregnancy occurs was also discussed with patient.   - posted for 05/09/18 PPD#1  6) Disposition - anticipated PPD#2  Malachy Mood, MD, Cherlynn June  [place], [facility] 05/08/2018, 9:24 AM

## 2018-05-09 ENCOUNTER — Other Ambulatory Visit: Payer: Self-pay

## 2018-05-09 ENCOUNTER — Inpatient Hospital Stay: Payer: Medicaid Other | Admitting: Registered Nurse

## 2018-05-09 ENCOUNTER — Encounter
Admission: EM | Disposition: A | Discharge status: Home / Self Care | Payer: Self-pay | Source: Home / Self Care | Attending: Obstetrics and Gynecology

## 2018-05-09 ENCOUNTER — Encounter: Payer: Self-pay | Admitting: Anesthesiology

## 2018-05-09 DIAGNOSIS — Z302 Encounter for sterilization: Secondary | ICD-10-CM

## 2018-05-09 HISTORY — PX: TUBAL LIGATION: SHX77

## 2018-05-09 LAB — TYPE AND SCREEN
ABO/RH(D): O NEG
ANTIBODY SCREEN: POSITIVE
UNIT DIVISION: 0
Unit division: 0

## 2018-05-09 LAB — BPAM RBC
Blood Product Expiration Date: 201912192359
Blood Product Expiration Date: 201912202359
UNIT TYPE AND RH: 9500
Unit Type and Rh: 9500

## 2018-05-09 LAB — FETAL SCREEN
Fetal Screen: NEGATIVE
Weak D: NEGATIVE

## 2018-05-09 SURGERY — LIGATION, FALLOPIAN TUBE, POSTPARTUM
Anesthesia: General | Site: Abdomen | Laterality: Bilateral

## 2018-05-09 MED ORDER — LIDOCAINE HCL (CARDIAC) PF 100 MG/5ML IV SOSY
PREFILLED_SYRINGE | INTRAVENOUS | Status: DC | PRN
  Administered 2018-05-09: 80 mg via INTRAVENOUS

## 2018-05-09 MED ORDER — DEXAMETHASONE SODIUM PHOSPHATE 10 MG/ML IJ SOLN
INTRAMUSCULAR | Status: DC | PRN
  Administered 2018-05-09: 5 mg via INTRAVENOUS

## 2018-05-09 MED ORDER — ROCURONIUM BROMIDE 50 MG/5ML IV SOLN
INTRAVENOUS | Status: AC
  Filled 2018-05-09: qty 1

## 2018-05-09 MED ORDER — BUPIVACAINE HCL (PF) 0.5 % IJ SOLN
INTRAMUSCULAR | Status: AC
  Filled 2018-05-09: qty 30

## 2018-05-09 MED ORDER — RHO D IMMUNE GLOBULIN 1500 UNIT/2ML IJ SOSY
300.0000 ug | PREFILLED_SYRINGE | Freq: Once | INTRAMUSCULAR | Status: AC
  Administered 2018-05-09: 300 ug via INTRAVENOUS
  Filled 2018-05-09: qty 2

## 2018-05-09 MED ORDER — MIDAZOLAM HCL 2 MG/2ML IJ SOLN
INTRAMUSCULAR | Status: DC | PRN
  Administered 2018-05-09: 2 mg via INTRAVENOUS

## 2018-05-09 MED ORDER — ONDANSETRON HCL 4 MG/2ML IJ SOLN
INTRAMUSCULAR | Status: AC
  Filled 2018-05-09: qty 2

## 2018-05-09 MED ORDER — FENTANYL CITRATE (PF) 100 MCG/2ML IJ SOLN
INTRAMUSCULAR | Status: AC
  Filled 2018-05-09: qty 2

## 2018-05-09 MED ORDER — DEXAMETHASONE SODIUM PHOSPHATE 10 MG/ML IJ SOLN
INTRAMUSCULAR | Status: AC
  Filled 2018-05-09: qty 1

## 2018-05-09 MED ORDER — FENTANYL CITRATE (PF) 100 MCG/2ML IJ SOLN
INTRAMUSCULAR | Status: DC | PRN
  Administered 2018-05-09 (×2): 50 ug via INTRAVENOUS

## 2018-05-09 MED ORDER — SUCCINYLCHOLINE CHLORIDE 20 MG/ML IJ SOLN
INTRAMUSCULAR | Status: DC | PRN
  Administered 2018-05-09: 100 mg via INTRAVENOUS

## 2018-05-09 MED ORDER — SUGAMMADEX SODIUM 200 MG/2ML IV SOLN
INTRAVENOUS | Status: AC
  Filled 2018-05-09: qty 2

## 2018-05-09 MED ORDER — OXYCODONE-ACETAMINOPHEN 5-325 MG PO TABS
ORAL_TABLET | ORAL | Status: AC
  Administered 2018-05-09: 14:00:00
  Filled 2018-05-09: qty 1

## 2018-05-09 MED ORDER — FENTANYL CITRATE (PF) 100 MCG/2ML IJ SOLN
INTRAMUSCULAR | Status: AC
  Administered 2018-05-09: 25 ug via INTRAVENOUS
  Filled 2018-05-09: qty 2

## 2018-05-09 MED ORDER — FENTANYL CITRATE (PF) 100 MCG/2ML IJ SOLN
25.0000 ug | INTRAMUSCULAR | Status: AC | PRN
  Administered 2018-05-09 (×6): 25 ug via INTRAVENOUS

## 2018-05-09 MED ORDER — BUPIVACAINE HCL 0.5 % IJ SOLN
INTRAMUSCULAR | Status: DC | PRN
  Administered 2018-05-09: 10 mL

## 2018-05-09 MED ORDER — SUGAMMADEX SODIUM 200 MG/2ML IV SOLN
INTRAVENOUS | Status: DC | PRN
  Administered 2018-05-09: 100 mg via INTRAVENOUS

## 2018-05-09 MED ORDER — PROPOFOL 10 MG/ML IV BOLUS
INTRAVENOUS | Status: DC | PRN
  Administered 2018-05-09: 50 mg via INTRAVENOUS
  Administered 2018-05-09: 120 mg via INTRAVENOUS

## 2018-05-09 MED ORDER — ONDANSETRON HCL 4 MG/2ML IJ SOLN: 4.0000 mg | Freq: Once | INTRAMUSCULAR | Status: DC | PRN

## 2018-05-09 MED ORDER — OXYCODONE-ACETAMINOPHEN 5-325 MG PO TABS
2.0000 | ORAL_TABLET | Freq: Once | ORAL | Status: AC
  Administered 2018-05-09: 2 via ORAL

## 2018-05-09 MED ORDER — ONDANSETRON HCL 4 MG/2ML IJ SOLN
INTRAMUSCULAR | Status: DC | PRN
  Administered 2018-05-09: 4 mg via INTRAVENOUS

## 2018-05-09 MED ORDER — MIDAZOLAM HCL 2 MG/2ML IJ SOLN
INTRAMUSCULAR | Status: AC
  Filled 2018-05-09: qty 2

## 2018-05-09 MED ORDER — ROCURONIUM BROMIDE 100 MG/10ML IV SOLN
INTRAVENOUS | Status: DC | PRN
  Administered 2018-05-09: 5 mg via INTRAVENOUS

## 2018-05-09 SURGICAL SUPPLY — 27 items
CHLORAPREP W/TINT 26ML (MISCELLANEOUS) ×3 IMPLANT
COVER WAND RF STERILE (DRAPES) IMPLANT
DERMABOND ADVANCED (GAUZE/BANDAGES/DRESSINGS) ×2
DERMABOND ADVANCED .7 DNX12 (GAUZE/BANDAGES/DRESSINGS) ×1 IMPLANT
DRAPE LAPAROTOMY 100X77 ABD (DRAPES) ×3 IMPLANT
DRSG TEGADERM 2-3/8X2-3/4 SM (GAUZE/BANDAGES/DRESSINGS) IMPLANT
DRSG TELFA 4X3 1S NADH ST (GAUZE/BANDAGES/DRESSINGS) ×3 IMPLANT
ELECT REM PT RETURN 9FT ADLT (ELECTROSURGICAL) ×3
ELECTRODE REM PT RTRN 9FT ADLT (ELECTROSURGICAL) ×1 IMPLANT
GLOVE BIO SURGEON STRL SZ7 (GLOVE) ×3 IMPLANT
GLOVE INDICATOR 7.5 STRL GRN (GLOVE) ×3 IMPLANT
GOWN STRL REUS W/ TWL LRG LVL3 (GOWN DISPOSABLE) ×2 IMPLANT
GOWN STRL REUS W/TWL LRG LVL3 (GOWN DISPOSABLE) ×4
LABEL OR SOLS (LABEL) IMPLANT
NEEDLE HYPO 22GX1.5 SAFETY (NEEDLE) ×3 IMPLANT
NS IRRIG 500ML POUR BTL (IV SOLUTION) ×3 IMPLANT
PACK BASIN MINOR ARMC (MISCELLANEOUS) ×3 IMPLANT
SPONGE LAP 4X18 RFD (DISPOSABLE) ×3 IMPLANT
STRAP SAFETY 5IN WIDE (MISCELLANEOUS) ×3 IMPLANT
SUT CHROMIC GUT BROWN 0 54 (SUTURE) ×1 IMPLANT
SUT CHROMIC GUT BROWN 0 54IN (SUTURE) ×3
SUT MNCRL 4-0 (SUTURE) ×2
SUT MNCRL 4-0 27XMFL (SUTURE) ×1
SUT VIC AB 0 UR5 27 (SUTURE) ×3 IMPLANT
SUT VIC AB 2-0 UR6 27 (SUTURE) ×6 IMPLANT
SUTURE MNCRL 4-0 27XMF (SUTURE) ×1 IMPLANT
SYR 10ML LL (SYRINGE) ×3 IMPLANT

## 2018-05-09 NOTE — Op Note (Signed)
Preoperative Diagnosis: 1) 22 y.o.  [id number] desiring permanent surgical sterilization  Postoperative Diagnosis: 1) 22 y.o.  [id number] desiring permanent surgical sterilization  Operation Performed: Postpartum bilateral tubal ligation via Pomeroy method  Indication: 22 y.o. [id number]  with undesired fertility, desires permanent sterilization.  Other reversible forms of contraception were discussed with patient; she declines all other modalities. Permanent nature of as well as associated risks of the procedure discussed with patient including but not limited to: risk of regret, permanence of method, bleeding, infection, injury to surrounding organs and need for additional procedures.  Failure risk of 0.5-1% with increased risk of ectopic gestation if pregnancy occurs was also discussed with patient.    Surgeon: Vena AustriaAndreas Jarmaine Ehrler, MD  Anesthesia: Choice  Preoperative Antibiotics: none  Estimated Blood Loss: minimal IV Fluids: 300mL  Urine Output:: N/A  Drains or Tubes: none  Implants: none  Specimens Removed: Bilateral segment of fallopian tube  Complications: none  Intraoperative Findings: Normal tubes, ovaries, and uterine fundus.  Good 1cm portion of tube excised from each fallopian tube with tubal ostia visualized.  Patient Condition: stable  Procedure in Detail:  Patient was taken to the operating room where she was administered general anesthesia.  She was positioned in the supine position, prepped and draped in the usual sterile fashion.  Prior to proceeding with procedure a time out was performed.  Attention was turned to the patient's abdomen.  The umbilicus was infiltrated with 1% Sensorcaine, before making a 3cm vertical infraumbilical stab incision using an 11 blade scalpel.  The subcutaneous tissue was dissected of the rectus fascia using a hemostat.  The fascia was then grasped with the hemostat, tented up, regrasped with second hemostat before releasing and re-grasping with  the first hemostat to verify no bowl was grasped.  The fascia was incised using a mayo scissors.  The fascial edges were taged with 0 Vicryl stay sutures prior to removing them.  Arm[employer] retractors were placed the peritoneum was identified, grasped with a hemostat, incised using metzenbaum scissors after verifying that the tissue was translucent by placing the scissors behind the tissue to verify it was free of omentum or bowl.  The operators fingers was placed through the incision and noted it to be free of adhesive disease or umbilical hernias.  The table was put into left tilt, a small moist lap pad tagged with a hemostat was then placed through in incision using Singley forceps to pack away and bowl or omentum.  The right fallopian tube was identified and walked out to its fimbriated end using the Singley forceps and a Babcock clamps.  A mid isthmic portion of tube was then suture ligated twice using a 0 chromic wheel.  The intervening knuckle of tube was then excised.  The tube was noted to be hemostatic before returning it to the abdomen.  The mini-lap was removed and the patient table was tilted to the right with the same procedure being repeated for the patient's left tube.    The previously placed stay sutures were tied together, no fascial defects were noted after closure.  The skin was closed using a 4-0 Monocryl in a subcuticular fashion.  The incision was then dressed with surgical skin glue.  Sponge needle and instrument counts were correct time two.  The patient tolerated the procedure well and was taken to the recovery room in stable condition.

## 2018-05-09 NOTE — Anesthesia Preprocedure Evaluation (Signed)
Anesthesia Evaluation  Patient identified by MRN, date of birth, ID band Patient awake    Reviewed: Allergy & Precautions, H&P , NPO status , Patient's Chart, lab work & pertinent test results, reviewed documented beta blocker date and time   Airway Mallampati: II  TM Distance: >3 FB Neck ROM: full    Dental no notable dental hx. (+) Teeth Intact   Pulmonary neg pulmonary ROS, Current Smoker,    Pulmonary exam normal breath sounds clear to auscultation       Cardiovascular Exercise Tolerance: Good negative cardio ROS   Rhythm:regular Rate:Normal     Neuro/Psych negative neurological ROS  negative psych ROS   GI/Hepatic negative GI ROS, Neg liver ROS,   Endo/Other  negative endocrine ROSdiabetes  Renal/GU negative Renal ROS     Musculoskeletal   Abdominal   Peds  Hematology  (+) Blood dyscrasia, anemia ,   Anesthesia Other Findings   Reproductive/Obstetrics                             Anesthesia Physical  Anesthesia Plan  ASA: II  Anesthesia Plan: General   Post-op Pain Management:    Induction: Intravenous  PONV Risk Score and Plan:   Airway Management Planned: Oral ETT  Additional Equipment:   Intra-op Plan:   Post-operative Plan: Extubation in OR  Informed Consent: I have reviewed the patients History and Physical, chart, labs and discussed the procedure including the risks, benefits and alternatives for the proposed anesthesia with the patient or authorized representative who has indicated his/her understanding and acceptance.     Plan Discussed with: CRNA and Surgeon  Anesthesia Plan Comments:         Anesthesia Quick Evaluation

## 2018-05-09 NOTE — Progress Notes (Signed)
Subjective:   No concerns.  NPO since midnight.  Minimal lochia.  No fevers, no chills  Objective:  Vital signs in last 24 hours: Temp:  [97.7 F (36.5 C)-98.4 F (36.9 C)] 98.4 F (36.9 C) (11/18 0737) Pulse Rate:  [63-77] 74 (11/18 0737) Resp:  [18-20] 18 (11/18 0737) BP: (98-131)/(55-84) 98/63 (11/18 0737) SpO2:  [97 %-99 %] 98 % (11/18 0737)    General: NAD Pulmonary: no increased work of breathing Abdomen: non-distended, non-tender, fundus firm at level of umbilicus Extremities: no edema, no erythema, no tenderness  Results for orders placed or performed during the hospital encounter of 05/07/18 (from the past 72 hour(s))  CBC     Status: Abnormal   Collection Time: 05/07/18  1:10 PM  Result Value Ref Range   WBC 9.7 4.0 - 10.5 K/uL   RBC 4.66 3.87 - 5.11 MIL/uL   Hemoglobin 11.6 (L) 12.0 - 15.0 g/dL   HCT 21.3 08.6 - 57.8 %   MCV 77.7 (L) 80.0 - 100.0 fL   MCH 24.9 (L) 26.0 - 34.0 pg   MCHC 32.0 30.0 - 36.0 g/dL   RDW 46.9 (H) 62.9 - 52.8 %   Platelets 190 150 - 400 K/uL   nRBC 0.0 0.0 - 0.2 %    Comment: Performed at [facility], [address] [place], [place] [address]  Type and screen [facility]     Status: None   Collection Time: 05/07/18  1:10 PM  Result Value Ref Range   ABO/RH(D) O NEG    Antibody Screen POS    Sample Expiration 05/10/2018    Antibody Identification PASSIVELY ACQUIRED ANTI-D    Unit Number [id number]    Blood Component Type RED CELLS,LR    Unit division 00    Status of Unit REL FROM [facility]    Transfusion Status OK TO TRANSFUSE    Crossmatch Result      COMPATIBLE Performed at [facility], [address], [place], [place] [address]    Unit Number [id number]    Blood Component Type RED CELLS,LR    Unit division 00    Status of Unit REL FROM [facility]    Transfusion Status OK TO TRANSFUSE    Crossmatch Result COMPATIBLE   Urine Drug Screen, Qualitative ([facility] only)     Status: None   Collection Time: 05/07/18  1:10 PM  Result Value Ref Range   Tricyclic, Ur Screen NONE DETECTED NONE DETECTED   Amphetamines, Ur Screen NONE DETECTED NONE DETECTED   MDMA (Ecstasy)Ur Screen NONE DETECTED NONE DETECTED   Cocaine Metabolite,Ur Harvey NONE DETECTED NONE DETECTED   Opiate, Ur Screen NONE DETECTED NONE DETECTED   Phencyclidine (PCP) Ur S NONE DETECTED NONE DETECTED   Cannabinoid 50 Ng, Ur  NONE DETECTED NONE DETECTED   Barbiturates, Ur Screen NONE DETECTED NONE DETECTED   Benzodiazepine, Ur Scrn NONE DETECTED NONE DETECTED   Methadone Scn, Ur NONE DETECTED NONE DETECTED    Comment: (NOTE) Tricyclics + metabolites, urine    Cutoff 1000 ng/mL Amphetamines + metabolites, urine  Cutoff 1000 ng/mL MDMA (Ecstasy), urine              Cutoff 500 ng/mL Cocaine Metabolite, urine          Cutoff 300 ng/mL Opiate + metabolites, urine        Cutoff 300 ng/mL Phencyclidine (PCP), urine         Cutoff 25 ng/mL Cannabinoid, urine  Cutoff 50 ng/mL Barbiturates + metabolites, urine  Cutoff 200 ng/mL Benzodiazepine, urine              Cutoff 200 ng/mL Methadone, urine                   Cutoff 300 ng/mL The urine drug screen provides only a preliminary, unconfirmed analytical test result and should not be used for non-medical purposes. Clinical consideration and professional judgment should be applied to any positive drug screen result due to possible interfering substances. A more specific alternate chemical method must be used in order to obtain a confirmed analytical result. Gas chromatography / mass spectrometry (GC/MS) is the preferred confirmat ory method. Performed at [facility], [address] [place], [place] [address]   Chlamydia/NGC rt PCR [facility] only)     Status: None   Collection Time: 05/07/18  1:10 PM  Result Value Ref Range   Specimen source GC/Chlam URINE, RANDOM    Chlamydia Tr NOT DETECTED NOT DETECTED   N gonorrhoeae NOT DETECTED NOT  DETECTED    Comment: (NOTE) This CT/NG assay has not been evaluated in patients with a history of  hysterectomy. Performed at [facility], [address] [place], [place] [address]   CBC     Status: Abnormal   Collection Time: 05/08/18  7:19 AM  Result Value Ref Range   WBC 13.8 (H) 4.0 - 10.5 K/uL   RBC 4.33 3.87 - 5.11 MIL/uL   Hemoglobin 10.9 (L) 12.0 - 15.0 g/dL   HCT 32.434.5 (L) 40.136.0 - 02.746.0 %   MCV 79.7 (L) 80.0 - 100.0 fL   MCH 25.2 (L) 26.0 - 34.0 pg   MCHC 31.6 30.0 - 36.0 g/dL   RDW 25.319.8 (H) 66.411.5 - 40.315.5 %   Platelets 190 150 - 400 K/uL   nRBC 0.0 0.0 - 0.2 %    Comment: Performed at [facility], [address] [place], [place] [address]    Assessment:   22 y.o. [phone] postpartum day # 1 TSVD scheduled for postpartum BTL todya  Plan:    1) Acute blood loss anemia - hemodynamically stable and asymptomatic - po ferrous sulfate  2) Blood Type --/--/O NEG (11/16 1310) / Rubella 1.65 (03/19 1511) / Varicella Non-Immune  - varivax at discharge - baby Rh positive rhogam indicated  3) TDAP status up to date  4) Feeding plan bottle  5)  Education given regarding options for contraception, as well as compatibility with breast feeding if applicable.  Patient plans on tubal ligation for contraception. 22 y.o. [id number]  with undesired fertility, desires permanent sterilization.  Other reversible forms of contraception were discussed with patient; she declines all other modalities. Permanent nature of as well as associated risks of the procedure discussed with patient including but not limited to: risk of regret, permanence of method, bleeding, infection, injury to surrounding organs and need for additional procedures.  Failure risk of 0.5-1% with increased risk of ectopic gestation if pregnancy occurs was also discussed with patient.    6) Disposition - anticipated PPD2, POD1  Vena AustriaAndreas Latandra Loureiro, MD, Merlinda FrederickFACOG [facility], [facility] 05/09/2018, 9:39 AM

## 2018-05-09 NOTE — Transfer of Care (Signed)
Immediate Anesthesia Transfer of Care Note  Patient: Vicki Adams  Procedure(s) Performed: POST PARTUM TUBAL LIGATION (Bilateral Abdomen)  Patient Location: PACU  Anesthesia Type:General  Level of Consciousness: sedated  Airway & Oxygen Therapy: Patient Spontanous Breathing and Patient connected to nasal cannula oxygen  Post-op Assessment: Report given to RN and Post -op Vital signs reviewed and stable  Post vital signs: Reviewed and stable  Last Vitals:  Vitals Value Taken Time  BP 117/77 05/09/2018  1:23 PM  Temp 36.4 C 05/09/2018  1:23 PM  Pulse 92 05/09/2018  1:23 PM  Resp 12 05/09/2018  1:23 PM  SpO2 100 % 05/09/2018  1:23 PM    Last Pain:  Vitals:   05/09/18 1321  TempSrc:   PainSc: Asleep      Patients Stated Pain Goal: 0 (05/07/18 2241)  Complications: No apparent anesthesia complications

## 2018-05-09 NOTE — Anesthesia Post-op Follow-up Note (Signed)
Anesthesia QCDR form completed.        

## 2018-05-09 NOTE — Anesthesia Procedure Notes (Signed)
Procedure Name: Intubation Date/Time: 05/09/2018 12:27 PM Performed by: Hedda Slade, CRNA Pre-anesthesia Checklist: Patient identified, Patient being monitored, Timeout performed, Emergency Drugs available and Suction available Patient Re-evaluated:Patient Re-evaluated prior to induction Oxygen Delivery Method: Circle system utilized Preoxygenation: Pre-oxygenation with 100% oxygen Induction Type: IV induction, Rapid sequence and Cricoid Pressure applied Laryngoscope Size: Mac and 3 Grade View: Grade I Tube type: Oral Tube size: 7.0 mm Number of attempts: 1 Airway Equipment and Method: Stylet Placement Confirmation: ETT inserted through vocal cords under direct vision,  positive ETCO2 and breath sounds checked- equal and bilateral Secured at: 21 cm Tube secured with: Tape Dental Injury: Teeth and Oropharynx as per pre-operative assessment

## 2018-05-10 ENCOUNTER — Encounter: Payer: Self-pay | Admitting: Obstetrics and Gynecology

## 2018-05-10 LAB — CBC
HCT: 32.7 % — ABNORMAL LOW (ref 36.0–46.0)
Hemoglobin: 10.3 g/dL — ABNORMAL LOW (ref 12.0–15.0)
MCH: 25.1 pg — ABNORMAL LOW (ref 26.0–34.0)
MCHC: 31.5 g/dL (ref 30.0–36.0)
MCV: 79.8 fL — ABNORMAL LOW (ref 80.0–100.0)
Platelets: 182 10*3/uL (ref 150–400)
RBC: 4.1 MIL/uL (ref 3.87–5.11)
RDW: 20.1 % — ABNORMAL HIGH (ref 11.5–15.5)
WBC: 10.4 10*3/uL (ref 4.0–10.5)
nRBC: 0 % (ref 0.0–0.2)

## 2018-05-10 LAB — SURGICAL PATHOLOGY

## 2018-05-10 LAB — RHOGAM INJECTION: Unit division: 0

## 2018-05-10 LAB — RPR: RPR: NONREACTIVE

## 2018-05-10 MED ORDER — OXYCODONE-ACETAMINOPHEN 5-325 MG PO TABS: 1.0000 | ORAL_TABLET | Freq: Four times a day (QID) | ORAL | 0 refills | Status: DC | PRN

## 2018-05-10 MED ORDER — DOCUSATE SODIUM 100 MG PO CAPS
100.0000 mg | ORAL_CAPSULE | Freq: Two times a day (BID) | ORAL | Status: DC | PRN
  Administered 2018-05-10: 100 mg via ORAL
  Filled 2018-05-10: qty 1

## 2018-05-10 MED ORDER — VARICELLA VIRUS VACCINE LIVE 1350 PFU/0.5ML IJ SUSR: 0.5000 mL | INTRAMUSCULAR | Status: DC | PRN

## 2018-05-10 MED ORDER — DOCUSATE SODIUM 100 MG PO TABS: 100.0000 mg | ORAL_TABLET | Freq: Two times a day (BID) | ORAL | 0 refills | Status: DC

## 2018-05-10 NOTE — Anesthesia Postprocedure Evaluation (Signed)
Anesthesia Post Note  Patient: Molli PoseyCheyenne Nicole Porter  Procedure(s) Performed: POST PARTUM TUBAL LIGATION (Bilateral Abdomen)  Patient location during evaluation: PACU Anesthesia Type: General Level of consciousness: awake and alert Pain management: pain level controlled Vital Signs Assessment: post-procedure vital signs reviewed and stable Respiratory status: spontaneous breathing, nonlabored ventilation, respiratory function stable and patient connected to nasal cannula oxygen Cardiovascular status: blood pressure returned to baseline and stable Postop Assessment: no apparent nausea or vomiting Anesthetic complications: no     Last Vitals:  Vitals:   05/10/18 0334 05/10/18 0336  BP: (!) 86/47 110/71  Pulse: 71 60  Resp: 18   Temp: 36.7 C   SpO2: 98%     Last Pain:  Vitals:   05/10/18 0334  TempSrc: Oral  PainSc:                  Cleda MccreedyJoseph K Piscitello

## 2018-05-10 NOTE — Progress Notes (Signed)
Patient discharged home with infant. Discharge instructions and prescriptions given and reviewed with patient. Patient verbalized understanding. Will be escorted out by auxillary.  

## 2018-05-10 NOTE — Progress Notes (Signed)
Patient refuses varicella vaccine. Patient educated.

## 2018-05-15 LAB — MISC LABCORP TEST (SEND OUT)

## 2018-08-31 ENCOUNTER — Other Ambulatory Visit: Payer: Self-pay

## 2018-08-31 ENCOUNTER — Encounter: Payer: Self-pay | Admitting: *Deleted

## 2018-08-31 ENCOUNTER — Emergency Department
Admission: EM | Admit: 2018-08-31 | Discharge: 2018-08-31 | Disposition: A | Discharge status: Home / Self Care | Payer: Medicaid Other | Attending: Emergency Medicine | Admitting: Emergency Medicine

## 2018-08-31 ENCOUNTER — Emergency Department: Payer: Medicaid Other

## 2018-08-31 DIAGNOSIS — M79604 Pain in right leg: Secondary | ICD-10-CM | POA: Diagnosis present

## 2018-08-31 MED ORDER — IBUPROFEN 800 MG PO TABS: 800.0000 mg | ORAL_TABLET | Freq: Three times a day (TID) | ORAL | 0 refills | Status: DC | PRN

## 2018-08-31 MED ORDER — CYCLOBENZAPRINE HCL 10 MG PO TABS: 10.0000 mg | ORAL_TABLET | Freq: Three times a day (TID) | ORAL | 0 refills | Status: DC | PRN

## 2018-08-31 NOTE — ED Notes (Signed)
Patient reports pain to right calf and behind right knee since Saturday. Reports pain had gotten better the last 2 days, but this morning she woke up and pain was worse. Pain worse with palpation and weight bearing. No obvious redness or swelling noted. Good pedal pulse.

## 2018-08-31 NOTE — ED Provider Notes (Signed)
[facility] Emergency Department Provider Note       Time seen: ----------------------------------------- 3:40 PM on 08/31/2018 -----------------------------------------   I have reviewed the triage vital signs and the nursing notes.  HISTORY   Chief Complaint Knee Pain and Leg Pain    HPI Vicki Adams is a 23 y.o. female with a history of anemia, syncope who presents to the ED for pain in the right lower leg and behind her right knee.  Patient reports is painful to ambulate.  She was sent to the ER to rule out a blood clot from the local urgent care.  She denies any other complaints.  Past Medical History:  Diagnosis Date  . Anemia   . Secundum ASD    a. 11/2000 s/p closure @ [facility]; b. 04/2015 Echo: Nl LV/RV fxn. Mild PR, triv TR. Stable ASD occluder; b. 01/2016 Echo: EF 60-65%, no rwma.  . Syncope and collapse    a. With second pregnancy-->02/2016 Holter: sinus rhythm, elevated HR likely 2/2 pregnancy. No arrhythmias.    Patient Active Problem List   Diagnosis Date Noted  . Encounter for planned induction of labor 05/07/2018  . NST (non-stress test) reactive 04/22/2018  . Bacterial vaginosis in pregnancy 04/15/2018  . Abdominal pain during pregnancy in third trimester 03/24/2018  . Irregular uterine contractions 03/23/2018  . Antepartum anemia 03/08/2018  . Abdominal pain during pregnancy in second trimester 01/18/2018  . History of postpartum depression, currently pregnant 10/19/2017  . Rh negative state in antepartum period 09/07/2017  . Supervision of other normal pregnancy, antepartum 09/07/2017  . Congenital heart disease, maternal, antepartum 01/23/2016  . History of percutaneous transcatheter closure of congenital ASD 01/24/2015  . History of esophageal hernia repair 01/24/2015    Past Surgical History:  Procedure Laterality Date  . CARDIAC SURGERY  11/2000  . HERNIA REPAIR  1995/08/15   congential diaphragmatic hernia repair done  at 79 weeks of age at [facility]  . TUBAL LIGATION Bilateral 05/09/2018   Procedure: POST PARTUM TUBAL LIGATION;  Surgeon: Vena Austria, MD;  Location: [facility] ORS;  Service: Gynecology;  Laterality: Bilateral;    Allergies Erythromycin  Social History Social History   Tobacco Use  . Smoking status: Never Smoker  . Smokeless tobacco: Never Used  Substance Use Topics  . Alcohol use: No    Comment: occ  . Drug use: No    Comment: ACID   Review of Systems Constitutional: Negative for fever. Cardiovascular: Negative for chest pain. Respiratory: Negative for shortness of breath. Musculoskeletal: Positive for right leg pain Skin: Negative for rash. Neurological: Negative for headaches, focal weakness or numbness.  All systems negative/normal/unremarkable except as stated in the HPI  ____________________________________________   PHYSICAL EXAM:  VITAL SIGNS: ED Triage Vitals  Enc Vitals Group     BP 08/31/18 1502 115/71     Pulse Rate 08/31/18 1502 83     Resp 08/31/18 1502 16     Temp 08/31/18 1502 98.3 F (36.8 C)     Temp Source 08/31/18 1502 Oral     SpO2 08/31/18 1502 97 %     Weight 08/31/18 1503 178 lb (80.7 kg)     Height 08/31/18 1503 5\' 7"  (1.702 m)     Head Circumference --      Peak Flow --      Pain Score 08/31/18 1508 0     Pain Loc --      Pain Edu? --      Excl. in  GC? --    Constitutional: Alert and oriented. Well appearing and in no distress. Cardiovascular: Normal rate, regular rhythm. No murmurs, rubs, or gallops. Respiratory: Normal respiratory effort without tachypnea nor retractions. Breath sounds are clear and equal bilaterally. No wheezes/rales/rhonchi. Musculoskeletal: Mild right leg tenderness, especially behind the right knee Neurologic:  Normal speech and language. No gross focal neurologic deficits are appreciated.  Skin:  Skin is warm, dry and intact. No rash noted. Psychiatric: Mood and affect are normal. Speech and behavior are normal.   ____________________________________________  ED COURSE:  As part of my medical decision making, I reviewed the following data within the electronic MEDICAL RECORD NUMBER History obtained from family if available, nursing notes, old chart and ekg, as well as notes from prior ED visits. Patient presented for right leg pain, we will assess with labs and imaging as indicated at this time.   Procedures ____________________________________________   RADIOLOGY  Right leg ultrasound Is unremarkable ____________________________________________   DIFFERENTIAL DIAGNOSIS   DVT, strain, spasm, Baker's cyst  FINAL ASSESSMENT AND PLAN  Right leg pain   Plan: The patient had presented for right leg pain. Patient's imaging was negative for DVT.  Patient clearly has either muscle strain or tendinitis in the right leg.  She will be placed on NSAIDs with muscle relaxants and is cleared for outpatient follow-up.   Ulice Dash, MD    Note: This note was generated in part or whole with voice recognition software. Voice recognition is usually quite accurate but there are transcription errors that can and very often do occur. I apologize for any typographical errors that were not detected and corrected.     Emily Filbert, MD 08/31/18 [phone]

## 2018-08-31 NOTE — ED Triage Notes (Signed)
Pt to triage via wheelchair.  Pt has pain in right lower leg and behind right knee.  Painful to ambulate.  Pt sent to er to r/o  blood clot.  pt sent from [facility].  Pt alert.

## 2018-08-31 NOTE — ED Notes (Signed)
Pt reports pain in right lower leg and behind right knee.  Painful to ambulate.  Pt sent to er to r/o  blood clot from [facility].  Pt A/O x 4.

## 2018-10-28 ENCOUNTER — Telehealth: Payer: Self-pay | Admitting: Obstetrics and Gynecology

## 2018-10-28 ENCOUNTER — Other Ambulatory Visit: Payer: Self-pay

## 2018-10-28 ENCOUNTER — Other Ambulatory Visit: Payer: Medicaid Other

## 2018-10-28 DIAGNOSIS — N926 Irregular menstruation, unspecified: Secondary | ICD-10-CM

## 2018-10-28 NOTE — Telephone Encounter (Signed)
Pt had TL 11/19 and is concerned she is pregnant. LMP after Easter but was 2-3 days long. Feels pregnant, also with n/v. Had 2 home UPTs with faint positive lines. Wants blood pregnancy test. RTO at 2:00 for serum beta. Will f/u with results.

## 2018-10-29 ENCOUNTER — Encounter: Payer: Self-pay | Admitting: Obstetrics and Gynecology

## 2018-10-29 LAB — BETA HCG QUANT (REF LAB): hCG Quant: <1 m[IU]/mL

## 2018-11-21 ENCOUNTER — Other Ambulatory Visit: Payer: Self-pay

## 2018-11-21 ENCOUNTER — Encounter: Payer: Self-pay | Admitting: *Deleted

## 2018-11-21 ENCOUNTER — Emergency Department
Admission: EM | Admit: 2018-11-21 | Discharge: 2018-11-22 | Disposition: A | Discharge status: Home / Self Care | Payer: Medicaid Other | Attending: Emergency Medicine | Admitting: Emergency Medicine

## 2018-11-21 DIAGNOSIS — K6289 Other specified diseases of anus and rectum: Secondary | ICD-10-CM | POA: Diagnosis present

## 2018-11-21 DIAGNOSIS — K625 Hemorrhage of anus and rectum: Secondary | ICD-10-CM | POA: Insufficient documentation

## 2018-11-21 LAB — COMPREHENSIVE METABOLIC PANEL
ALT: 16 U/L (ref 0–44)
AST: 16 U/L (ref 15–41)
Albumin: 4.1 g/dL (ref 3.5–5.0)
Alkaline Phosphatase: 52 U/L (ref 38–126)
Anion gap: 6 (ref 5–15)
BUN: 18 mg/dL (ref 6–20)
CO2: 28 mmol/L (ref 22–32)
Calcium: 8.7 mg/dL — ABNORMAL LOW (ref 8.9–10.3)
Chloride: 102 mmol/L (ref 98–111)
Creatinine, Ser: 0.81 mg/dL (ref 0.44–1.00)
GFR calc Af Amer: >60 mL/min (ref >60)
GFR calc non Af Amer: >60 mL/min (ref >60)
Glucose, Bld: 106 mg/dL — ABNORMAL HIGH (ref 70–99)
Potassium: 3.8 mmol/L (ref 3.5–5.1)
Sodium: 136 mmol/L (ref 135–145)
Total Bilirubin: 0.3 mg/dL (ref 0.3–1.2)
Total Protein: 7.2 g/dL (ref 6.5–8.1)

## 2018-11-21 LAB — CBC
HCT: 36.7 % (ref 36.0–46.0)
Hemoglobin: 12.1 g/dL (ref 12.0–15.0)
MCH: 27.4 pg (ref 26.0–34.0)
MCHC: 33 g/dL (ref 30.0–36.0)
MCV: 83.2 fL (ref 80.0–100.0)
Platelets: 178 10*3/uL (ref 150–400)
RBC: 4.41 MIL/uL (ref 3.87–5.11)
RDW: 13.9 % (ref 11.5–15.5)
WBC: 7.2 10*3/uL (ref 4.0–10.5)
nRBC: 0 % (ref 0.0–0.2)

## 2018-11-21 NOTE — ED Triage Notes (Signed)
Pt ambulatory to triage.  Pt has rectal bleeding with rectal pain.  No abd pain.  No diarrhea.  Pt also reports swollen area beneath right breast   No drainage  Pt alert.

## 2018-11-22 MED ORDER — IBUPROFEN 600 MG PO TABS
600.0000 mg | ORAL_TABLET | Freq: Once | ORAL | Status: AC
  Administered 2018-11-22: 600 mg via ORAL
  Filled 2018-11-22: qty 1

## 2018-11-22 NOTE — ED Provider Notes (Signed)
[facility] Emergency Department Provider Note  ____________________________________________  Time seen: Approximately 12:07 AM  I have reviewed the triage vital signs and the nursing notes.   HISTORY  Chief Complaint Rectal Bleeding and Abscess   HPI Vicki Adams is a 23 y.o. female with a history of anemia who presents for evaluation of rectal bleeding.  Patient reports that she has had rectal bleeding and pain for a month.  Every time she has a bowel movement she has bright red blood in the toilet.  She is complaining of intermittent sharp/pressure pain in her rectum for the last month.  Today she had anal sex which made the pain worse.  She denies vomiting, hematemesis, abdominal pain, dizziness, chest pain or shortness of breath.  She denies family history of ulcerative colitis or Crohn's disease.  She also reports a abscess on her right breast which has been present for a month.  She reports initially some mild purulent drainage but none anymore.  No pain, no fever.  Past Medical History:  Diagnosis Date  . Anemia   . Secundum ASD    a. 11/2000 s/p closure @ [facility]; b. 04/2015 Echo: Nl LV/RV fxn. Mild PR, triv TR. Stable ASD occluder; b. 01/2016 Echo: EF 60-65%, no rwma.  . Syncope and collapse    a. With second pregnancy-->02/2016 Holter: sinus rhythm, elevated HR likely 2/2 pregnancy. No arrhythmias.    Patient Active Problem List   Diagnosis Date Noted  . Encounter for planned induction of labor 05/07/2018  . NST (non-stress test) reactive 04/22/2018  . Bacterial vaginosis in pregnancy 04/15/2018  . Abdominal pain during pregnancy in third trimester 03/24/2018  . Irregular uterine contractions 03/23/2018  . Antepartum anemia 03/08/2018  . Abdominal pain during pregnancy in second trimester 01/18/2018  . History of postpartum depression, currently pregnant 10/19/2017  . Rh negative state in antepartum period 09/07/2017  . Supervision of  other normal pregnancy, antepartum 09/07/2017  . Congenital heart disease, maternal, antepartum 01/23/2016  . History of percutaneous transcatheter closure of congenital ASD 01/24/2015  . History of esophageal hernia repair 01/24/2015    Past Surgical History:  Procedure Laterality Date  . CARDIAC SURGERY  11/2000  . HERNIA REPAIR  1995-07-03   congential diaphragmatic hernia repair done at 53 weeks of age at [facility]  . TUBAL LIGATION Bilateral 05/09/2018   Procedure: POST PARTUM TUBAL LIGATION;  Surgeon: Vena Austria, MD;  Location: [facility] ORS;  Service: Gynecology;  Laterality: Bilateral;    Prior to Admission medications   Medication Sig Start Date End Date Taking? Authorizing Provider  cyclobenzaprine (FLEXERIL) 10 MG tablet Take 1 tablet (10 mg total) by mouth 3 (three) times daily as needed for muscle spasms. 08/31/18   Emily Filbert, MD  Docusate Sodium 100 MG capsule Take 1 tablet (100 mg total) by mouth 2 (two) times daily. 05/10/18   Oswaldo Conroy, CNM  ibuprofen (ADVIL,MOTRIN) 800 MG tablet Take 1 tablet (800 mg total) by mouth every 8 (eight) hours as needed. 08/31/18   Emily Filbert, MD  oxyCODONE-acetaminophen (PERCOCET/ROXICET) 5-325 MG tablet Take 1-2 tablets by mouth every 6 (six) hours as needed. 05/10/18   Oswaldo Conroy, CNM  Prenat w/o A-FeCbGl-DSS-FA-DHA (CITRANATAL DHA) 27-1 & 250 MG tablet Take 1 tablet by mouth daily. 09/23/17   Tresea Mall, CNM    Allergies Erythromycin  Family History  Problem Relation Age of Onset  . Migraines Mother   . Kidney disease Father   .  Obesity Father   . Heart failure Father   . Scoliosis Sister   . Emphysema Maternal Grandmother   . Heart failure Maternal Grandmother     Social History Social History   Tobacco Use  . Smoking status: Never Smoker  . Smokeless tobacco: Never Used  Substance Use Topics  . Alcohol use: No    Comment: occ  . Drug use: No    Comment: ACID    Review of Systems   Constitutional: Negative for fever. Eyes: Negative for visual changes. ENT: Negative for sore throat. Neck: No neck pain  Cardiovascular: Negative for chest pain. Respiratory: Negative for shortness of breath. Gastrointestinal: Negative for abdominal pain, vomiting or diarrhea. + rectal pain and bleeding Genitourinary: Negative for dysuria. Musculoskeletal: Negative for back pain. Skin: Negative for rash. Neurological: Negative for headaches, weakness or numbness. Psych: No SI or HI  ____________________________________________   PHYSICAL EXAM:  VITAL SIGNS: ED Triage Vitals  Enc Vitals Group     BP 11/21/18 2141 130/68     Pulse Rate 11/21/18 2141 80     Resp 11/21/18 2141 20     Temp 11/21/18 2141 98.6 F (37 C)     Temp Source 11/21/18 2141 Oral     SpO2 11/21/18 2141 98 %     Weight 11/21/18 2138 180 lb (81.6 kg)     Height 11/21/18 2138 5\' 7"  (1.702 m)     Head Circumference --      Peak Flow --      Pain Score 11/21/18 2138 10     Pain Loc --      Pain Edu? --      Excl. in GC? --     Constitutional: Alert and oriented. Well appearing and in no apparent distress. HEENT:      Head: Normocephalic and atraumatic.         Eyes: Conjunctivae are normal. Sclera is non-icteric.       Mouth/Throat: Mucous membranes are moist.       Neck: Supple with no signs of meningismus. Cardiovascular: Regular rate and rhythm. No murmurs, gallops, or rubs. 2+ symmetrical distal pulses are present in all extremities. No JVD. Respiratory: Normal respiratory effort. Lungs are clear to auscultation bilaterally. No wheezes, crackles, or rhonchi.  Gastrointestinal: Soft, non tender, and non distended with positive bowel sounds. No rebound or guarding. Genitourinary: Rectal exam is normal with no anal fissure, no hemorrhoids, no active bleeding Breast: No chest deformity or asymmetry. Normal contours. No nodules, masses, tenderness, or axillary adenopathy. No nipple discharge. There is a  small spot under the R breast with darker coloration, no fluctuance, no erythema, no tenderness Musculoskeletal: Nontender with normal range of motion in all extremities. No edema, cyanosis, or erythema of extremities. Neurologic: Normal speech and language. Face is symmetric. Moving all extremities. No gross focal neurologic deficits are appreciated. Skin: Skin is warm, dry and intact. No rash noted. Psychiatric: Mood and affect are normal. Speech and behavior are normal.  ____________________________________________   LABS (all labs ordered are listed, but only abnormal results are displayed)  Labs Reviewed  COMPREHENSIVE METABOLIC PANEL - Abnormal; Notable for the following components:      Result Value   Glucose, Bld 106 (*)    Calcium 8.7 (*)    All other components within normal limits  CBC   ____________________________________________  EKG  none  ____________________________________________  RADIOLOGY  none  ____________________________________________   PROCEDURES  Procedure(s) performed: None Procedures Critical Care performed:  None ____________________________________________   INITIAL IMPRESSION / ASSESSMENT AND PLAN / ED COURSE   23 y.o. female with a history of anemia who presents for evaluation of rectal bleeding and pain.  #Rectal bleeding and pain: Symptoms ongoing for a month.  Worse today after having anal sex.  Exam is normal.  At this time I am concerned for either internal hemorrhoids versus inflammatory bowel disease.  Patient has been referred to GI.  She is hemodynamically stable with normal hemoglobin and no evidence of active bleeding at this time.  Discussed ibuprofen for pain and sitz bath's.  #Breast abscess: No evidence of active abscess or cellulitis.  Patient has small round dark-colored area under her right breast which looks like scarring from may be a possible old abscess.  Recommend follow-up with [facility] for evaluation.  No  palpable masses.      As part of my medical decision making, I reviewed the following data within the electronic MEDICAL RECORD NUMBER Nursing notes reviewed and incorporated, Labs reviewed , Old chart reviewed, Notes from prior ED visits and [place] Controlled Substance Database    Pertinent labs & imaging results that were available during my care of the patient were reviewed by me and considered in my medical decision making (see chart for details).    ____________________________________________   FINAL CLINICAL IMPRESSION(S) / ED DIAGNOSES  Final diagnoses:  Rectal bleeding      NEW MEDICATIONS STARTED DURING THIS VISIT:  ED Discharge Orders    None       Note:  This document was prepared using Dragon voice recognition software and may include unintentional dictation errors.    Don Perking, [place], MD 11/22/18 [phone]

## 2018-11-22 NOTE — ED Notes (Signed)
Dr. Veronese at the bedside for pt evaluation.  

## 2018-11-22 NOTE — Discharge Instructions (Addendum)
Follow-up with GI for further evaluation of rectal bleeding.  Follow-up at [facility] for evaluation of the skin finding on your breast.  Return to the emergency room if you have abdominal pain, dizziness, chest pain, shortness of breath.  For pain take 600 mg of ibuprofen every 6 hours with a meal or snack.  Sit on a warm bath and let warm water soak the area.

## 2018-11-23 ENCOUNTER — Other Ambulatory Visit: Payer: Self-pay

## 2018-11-23 ENCOUNTER — Ambulatory Visit (INDEPENDENT_AMBULATORY_CARE_PROVIDER_SITE_OTHER): Payer: Medicaid Other | Admitting: Obstetrics and Gynecology

## 2018-11-23 ENCOUNTER — Encounter: Payer: Self-pay | Admitting: Obstetrics and Gynecology

## 2018-11-23 VITALS — BP 130/90 | Ht 67.0 in | Wt 180.4 lb

## 2018-11-23 DIAGNOSIS — N941 Unspecified dyspareunia: Secondary | ICD-10-CM | POA: Diagnosis not present

## 2018-11-23 DIAGNOSIS — N649 Disorder of breast, unspecified: Secondary | ICD-10-CM | POA: Diagnosis not present

## 2018-11-23 DIAGNOSIS — K625 Hemorrhage of anus and rectum: Secondary | ICD-10-CM | POA: Diagnosis not present

## 2018-11-23 MED ORDER — HYDROCORT-PRAMOXINE (PERIANAL) 2.5-1 % EX CREA: TOPICAL_CREAM | Freq: Three times a day (TID) | CUTANEOUS | 0 refills | Status: DC

## 2018-11-23 NOTE — Progress Notes (Signed)
Vena AustriaStaebler, Andreas, MD   Chief Complaint  Patient presents with  . Breast exam    lump on right breast for about a month  . Vaginal Pain    during intercourse    HPI:      Ms. Vicki Adams is a 23 y.o. [phone] [id number] who LMP was Patient's last menstrual period was 11/09/2018 (approximate)., presents today for left breast lesion that started a month ago under RT breast. Felt like a "blocked vein" that is now better but has left a red mark. Had d/c at first but not recently, no pain. No nipple d/c. No FH breast/ovar cancer. Not breastfeeding.  Pt with dyspareunia since last delivery 11/19. Feels like cervix has dropped low and something is being hit. Has a little vaginal dryness.  Pt also with rectal bleeding with BMs for past month. Blood in toilet. Has constipation/hard stools and has pain with BM. Went to ED, referred to GI and has appt 12/05/18. No hemorrhoid seen in ED. Hx of recent anal sex with worsening sx.   Annual due with Dr. Bonney AidStaebler. Notes hot flashes.   Past Medical History:  Diagnosis Date  . Anemia   . Secundum ASD    a. 11/2000 s/p closure @ [facility]; b. 04/2015 Echo: Nl LV/RV fxn. Mild PR, triv TR. Stable ASD occluder; b. 01/2016 Echo: EF 60-65%, no rwma.  . Syncope and collapse    a. With second pregnancy-->02/2016 Holter: sinus rhythm, elevated HR likely 2/2 pregnancy. No arrhythmias.    Past Surgical History:  Procedure Laterality Date  . CARDIAC SURGERY  11/2000  . HERNIA REPAIR  06/1995   congential diaphragmatic hernia repair done at 552 [facility] of age [facility]  . TUBAL LIGATION Bilateral 05/09/2018   Procedure: POST PARTUM TUBAL LIGATION;  Surgeon: Vena AustriaStaebler, Andreas, MD;  Location: [facility] ORS;  Service: Gynecology;  Laterality: Bilateral;    Family History  Problem Relation Age of Onset  . Migraines Mother   . Kidney disease Father   . Obesity Father   . Heart failure Father   . Scoliosis Sister   . Emphysema Maternal Grandmother   . Heart failure Maternal  Grandmother     Social History   Socioeconomic History  . Marital status: Single    Spouse name: Not on file  . Number of children: 2  . Years of education: 8512  . Highest education level: Not on file  Occupational History  . 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 Financial resource strain: Not on file  . Food insecurity:    Worry: Not on file    Inability: Not on file  . Transportation needs:    Medical: Not on file    Non-medical: Not on file  Tobacco Use  . Smoking status: Never Smoker  . Smokeless tobacco: Never Used  Substance and Sexual Activity  . Alcohol use: No    Comment: occ  . Drug use: No    Comment: ACID  . Sexual activity: Yes    Birth control/protection: Other-see comments, Surgical    Comment: Tubal ligation  Lifestyle  . Physical activity:    Days per week: Not on file    Minutes per session: Not on file  . Stress: Not on file  Relationships  . Social connections:    Talks on phone: Not on file    Gets together: Not on file  Attends religious service: Not on file    Active member of club or organization: Not on file    Attends meetings of clubs or organizations: Not on file    Relationship status: Not on file  . Intimate partner violence:    Fear of current or ex partner: Not on file    Emotionally abused: Not on file    Physically abused: Not on file    Forced sexual activity: Not on file  Other Topics Concern  . Not on file  Social History Narrative  . Not on file    Outpatient Medications Prior to Visit  Medication Sig Dispense Refill  . cyclobenzaprine (FLEXERIL) 10 MG tablet Take 1 tablet (10 mg total) by mouth 3 (three) times daily as needed for muscle spasms. (Patient not taking: Reported on 11/23/2018) 30 tablet 0  . Docusate Sodium 100 MG capsule Take 1 tablet (100 mg total) by mouth 2 (two) times daily. 10 tablet 0  . ibuprofen (ADVIL,MOTRIN) 800 MG tablet  Take 1 tablet (800 mg total) by mouth every 8 (eight) hours as needed. 30 tablet 0  . oxyCODONE-acetaminophen (PERCOCET/ROXICET) 5-325 MG tablet Take 1-2 tablets by mouth every 6 (six) hours as needed. 30 tablet 0  . Prenat w/o A-FeCbGl-DSS-FA-DHA (CITRANATAL DHA) 27-1 & 250 MG tablet Take 1 tablet by mouth daily. 30 tablet 11   No facility-administered medications prior to visit.       ROS:  Review of Systems  Constitutional: Negative for fatigue, fever and unexpected weight change.  Respiratory: Negative for cough, shortness of breath and wheezing.   Cardiovascular: Negative for chest pain, palpitations and leg swelling.  Gastrointestinal: Positive for blood in stool, constipation and rectal pain. Negative for diarrhea, nausea and vomiting.  Endocrine: Positive for heat intolerance. Negative for cold intolerance and polyuria.  Genitourinary: Negative for dyspareunia, dysuria, flank pain, frequency, genital sores, hematuria, menstrual problem, pelvic pain, urgency, vaginal bleeding, vaginal discharge and vaginal pain.  Musculoskeletal: Negative for back pain, joint swelling and myalgias.  Skin: Negative for rash.  Neurological: Negative for dizziness, syncope, light-headedness, numbness and headaches.  Hematological: Negative for adenopathy.  Psychiatric/Behavioral: Negative for agitation, confusion, sleep disturbance and suicidal ideas. The patient is not nervous/anxious.    BREAST: mass/lesion   OBJECTIVE:   Vitals:  BP 130/90   Ht  (1.702 m)   Wt 180 lb 6.4 oz (81.8 kg)   LMP 11/09/2018 (Approximate)   Breastfeeding No   BMI 28.25 kg/m   Physical Exam Vitals signs reviewed.  Constitutional:      Appearance: She is well-developed.  Neck:     Musculoskeletal: Normal range of motion.  Pulmonary:     Effort: Pulmonary effort is normal.  Chest:     Breasts:        Right: Skin change present.     Genitourinary:    General: Normal vulva.     Pubic Area: No  rash.      Labia:        Right: No rash, tenderness or lesion.        Left: No rash, tenderness or lesion.      Vagina: Normal. No vaginal discharge, erythema or tenderness.     Cervix: Normal.     Uterus: Normal. Not enlarged and not tender.      Adnexa: Right adnexa normal and left adnexa normal.       Right: No mass or tenderness.         Left: No  mass or tenderness.       Rectum: Tenderness present.     Comments: NORMAL GYN EXAM; NO CERVICAL PROLAPSE; NO RECTOCELE/CYSTOCELE   RECTAL EXAM IS VERY PAINFUL FOR PT; UNABLE TO EVAL FULLY; A COUPLE SMALL SKIN TAGS PRESENT; NO EXT HEM Musculoskeletal: Normal range of motion.  Skin:    General: Skin is warm and dry.  Neurological:     General: No focal deficit present.     Mental Status: She is alert and oriented to person, place, and time.     Cranial Nerves: No cranial nerve deficit.  Psychiatric:        Mood and Affect: Mood normal.        Behavior: Behavior normal.        Thought Content: Thought content normal.        Judgment: Judgment normal.     Assessment/Plan: Breast lesion - Resolving. Reassurance. Can do warm compress/minimze bra use when possible until resolved.   Dyspareunia in female - Normal GYN exam. Reassurance. Could be just PP sx.  Rectal bleeding - Question anal fissure given sx hx. Pt too uncomfortable wiht exam. Try Rx analpram/add fiber and colace. Has appt with GI.  - Plan: hydrocortisone-pramoxine (ANALPRAM-HC) 2.5-1 % rectal cream    Meds ordered this encounter  Medications  . hydrocortisone-pramoxine (ANALPRAM-HC) 2.5-1 % rectal cream    Sig: Place rectally 3 (three) times daily.    Dispense:  30 g    Refill:  0    Order Specific Question:   Supervising Provider    Answer:   Nadara Mustard [[id number]]      Return in about 4 weeks (around 12/21/2018) for annual with AS.   B. , PA-C 11/23/2018 1:45 PM

## 2018-11-23 NOTE — Patient Instructions (Signed)
I value your feedback and entrusting us with your care. If you get a [facility] patient survey, I would appreciate you taking the time to let us know about your experience today. Thank you! 

## 2018-11-25 ENCOUNTER — Telehealth: Payer: Self-pay | Admitting: Nurse Practitioner

## 2018-11-25 NOTE — Telephone Encounter (Signed)
   Primary Cardiologist: No primary care provider on file.  Chart reviewed and patient contacted by phone today as part of pre-operative protocol coverage. Given past medical history and time since last visit, based on ACC/AHA guidelines, Vicki Adams would be at acceptable risk for the planned procedure without further cardiovascular testing.   Based on the guidelines she does not require pre op antibiotics.   I will route this recommendation to the requesting party via Epic fax function and remove from pre-op pool.  Please call with questions.  Corine Shelter, PA-C 11/25/2018, 1:59 PM

## 2018-11-25 NOTE — Telephone Encounter (Signed)
   [facility] Pre-operative Risk Assessment    Request for surgical clearance:  1. What type of surgery is being performed? Dental surgery  2. When is this surgery scheduled? TBD   3. What type of clearance is required (medical clearance vs. Pharmacy clearance to hold med vs. Both)?  medical  Are there any medications that need to be held prior to surgery and how long? Not listed  4. Practice name and name of physician performing surgery? [facility]  5. What is your office phone number [phone]   7.   What is your office fax number [phone]  8.   Anesthesia type (None, local, MAC, general) ? General  Patient delivered clearance form from her dentist office to our office here. She request this be expedited.    Vicki Adams 11/25/2018, 12:42 PM  _________________________________________________________________   (provider comments below)

## 2018-12-05 ENCOUNTER — Telehealth: Payer: Self-pay

## 2018-12-05 ENCOUNTER — Ambulatory Visit (INDEPENDENT_AMBULATORY_CARE_PROVIDER_SITE_OTHER): Payer: Medicaid Other | Admitting: Gastroenterology

## 2018-12-05 ENCOUNTER — Other Ambulatory Visit: Payer: Self-pay

## 2018-12-05 ENCOUNTER — Encounter: Payer: Self-pay | Admitting: Gastroenterology

## 2018-12-05 DIAGNOSIS — K625 Hemorrhage of anus and rectum: Secondary | ICD-10-CM

## 2018-12-05 MED ORDER — NA SULFATE-K SULFATE-MG SULF 17.5-3.13-1.6 GM/177ML PO SOLN: 1.0000 | Freq: Once | ORAL | 0 refills | Status: AC

## 2018-12-05 NOTE — Telephone Encounter (Signed)
   [facility] Pre-operative Risk Assessment    Request for surgical clearance:  1. What type of surgery is being performed? Colonoscopy   2. When is this surgery scheduled? 12/21/2018   3. What type of clearance is required (medical clearance vs. Pharmacy clearance to hold med vs. Both)? Medical clearance  4. Are there any medications that need to be held prior to surgery and how long? None   5. Practice name and name of physician performing surgery? [facility],  Dr. Jonathon Bellows   6. What is your office phone number [phone]    7.   What is your office fax number [phone]  8.   Anesthesia type (None, local, MAC, general) ? General   Rushie Chestnut 12/05/2018, 12:18 PM  _________________________________________________________________   (provider comments below)

## 2018-12-05 NOTE — Progress Notes (Signed)
Vicki MoodKiran Kerrilynn Adams  [address]  [address]  [place], [place] [address]  Main: [phone]  Fax: [phone]   Gastroenterology Consultation  Referring Provider:    ER Primary Care Physician:  Vena AustriaStaebler, Andreas, MD Reason for Consultation:     Rectal bleeding         HPI:   Virtual Visit via video  Note  I connected with patient on 12/05/18 at  8:30 AM EDT by video  and verified that I am speaking with the correct person using two identifiers.   I discussed the limitations, risks, security and privacy concerns of performing an evaluation and management service by video and the availability of in person appointments. I also discussed with the patient that there may be a patient responsible charge related to this service. The patient expressed understanding and agreed to proceed.  Location of the patient: Home Location of provider: Home Participating persons: Patient and provider only   History of Present Illness: Rectal bleeding    Vicki Adams is a 23 y.o. y/o female referred for consultation & management  by the ER. She presented to the ER on 11/21/2018 with rectal bleeding that were ongoing for a month . Hb on presentation was 12. 1 grams . 6 months back was 10.3 grams but was at that pregnant and about to deliver.   Rectal bleeding :  Onset and where was blood seen  :Began 1 month back , not as bad at present but has pain when she has a bowel movement and then has on and off bleeding mixed with the stool and on the toilet paper Frequency of bowel movements : every other day - sometimes once a day  Consistency : has been hard in the past  Change in shape of stool:no  Pain associated with bowel movements:yes - when the stool is coming out  Blood thinner usage:no  NSAID's: no  Prior colonoscopy :no  Family history of colon cancer or polyps:no  Weight loss:no    Presently stool is soft but still has on and off rectal bleeding.    Past Medical History:   Diagnosis Date  . Anemia   . Secundum ASD    a. 11/2000 s/p closure @ [facility]; b. 04/2015 Echo: Nl LV/RV fxn. Mild PR, triv TR. Stable ASD occluder; b. 01/2016 Echo: EF 60-65%, no rwma.  . Syncope and collapse    a. With second pregnancy-->02/2016 Holter: sinus rhythm, elevated HR likely 2/2 pregnancy. No arrhythmias.    Past Surgical History:  Procedure Laterality Date  . CARDIAC SURGERY  11/2000  . HERNIA REPAIR  06/1995   cong[facility] 802 [facility]  . TUBAL LIGATION Bilateral 05/09/2018   Procedure: POST PARTUM TUBAL LIGATION;  Surgeon: Vena AustriaStaebler, Andreas, MD;  Location: [facility] ORS;  Service: Gynecology;  Laterality: Bilateral;    Prior to Admission medications   Medication Sig Start Date End Date Taking? Authorizing Provider  cyclobenzaprine (FLEXERIL) 10 MG tablet Take 1 tablet (10 mg total) by mouth 3 (three) times daily as needed for muscle spasms. Patient not taking: Reported on 11/23/2018 08/31/18   Emily FilbertWilliams, Jonathan E, MD  hydrocortisone-pramoxine [facility](ANALPRAM-HC) 2.5-1 % rectal cream Place rectally 3 (three) times daily. 11/23/18   Copland, Ilona SorrelAlicia B, PA-C    Family History  Problem Relation Age of Onset  . Migraines Mother   . Kidney disease Father   . Obesity Father   . Heart failure Father   .  Scoliosis Sister   . Emphysema Maternal Grandmother   . Heart failure Maternal Grandmother      Social History   Tobacco Use  . Smoking status: Never Smoker  . Smokeless tobacco: Never Used  Substance Use Topics  . Alcohol use: No    Comment: occ  . Drug use: No    Comment: ACID    Allergies as of 12/05/2018 - Review Complete 11/23/2018  Allergen Reaction Noted  . Erythromycin Anaphylaxis, Swelling, and Nausea And Vomiting 11/15/2014    Review of Systems:    All systems reviewed and negative except where noted in HPI. General Appearance:    Alert, cooperative, no distress, appears stated age  Head:    Normocephalic, without obvious  abnormality, atraumatic  Eyes:    PERRL, conjunctiva/corneas clear,  Ears:    Grossly normal hearing    Neurologic:   Grossly appears normal     Observations/Objective:  Labs: CBC    Component Value Date/Time   WBC 7.2 11/21/2018 2202   RBC 4.41 11/21/2018 2202   HGB 12.1 11/21/2018 2202   HGB 9.3 (L) 02/22/2018 1020   HCT 36.7 11/21/2018 2202   HCT 29.0 (L) 02/22/2018 1020   PLT 178 11/21/2018 2202   PLT 187 02/22/2018 1020   MCV 83.2 11/21/2018 2202   MCV 77 (L) 02/22/2018 1020   MCV 79 (L) 09/15/2014 0019   MCH 27.4 11/21/2018 2202   MCHC 33.0 11/21/2018 2202   RDW 13.9 11/21/2018 2202   RDW 16.5 (H) 02/22/2018 1020   RDW 14.7 (H) 09/15/2014 0019   LYMPHSABS 1.5 02/22/2018 1020   LYMPHSABS 1.6 09/15/2014 0019   MONOABS 0.9 12/13/2014 2047   MONOABS 1.3 (H) 09/15/2014 0019   EOSABS 0.1 02/22/2018 1020   EOSABS 0.2 09/15/2014 0019   BASOSABS 0.0 02/22/2018 1020   BASOSABS 0.0 09/15/2014 0019   CMP     Component Value Date/Time   NA 136 11/21/2018 2202   NA 138 02/15/2018 1051   NA 137 09/15/2014 0019   K 3.8 11/21/2018 2202   K 3.2 (L) 09/15/2014 0019   CL 102 11/21/2018 2202   CL 103 09/15/2014 0019   CO2 28 11/21/2018 2202   CO2 27 09/15/2014 0019   GLUCOSE 106 (H) 11/21/2018 2202   GLUCOSE 108 (H) 09/15/2014 0019   BUN 18 11/21/2018 2202   BUN 5 (L) 02/15/2018 1051   BUN 8 09/15/2014 0019   CREATININE 0.81 11/21/2018 2202   CREATININE 0.77 09/15/2014 0019   CALCIUM 8.7 (L) 11/21/2018 2202   CALCIUM 8.7 (L) 09/15/2014 0019   PROT 7.2 11/21/2018 2202   PROT 6.0 02/15/2018 1051   PROT 6.8 09/15/2014 0019   ALBUMIN 4.1 11/21/2018 2202   ALBUMIN 3.2 (L) 02/15/2018 1051   ALBUMIN 3.9 09/15/2014 0019   AST 16 11/21/2018 2202   AST 17 09/15/2014 0019   ALT 16 11/21/2018 2202   ALT 13 (L) 09/15/2014 0019   ALKPHOS 52 11/21/2018 2202   ALKPHOS 51 09/15/2014 0019   BILITOT 0.3 11/21/2018 2202   BILITOT 0.2 02/15/2018 1051   BILITOT 0.4 09/15/2014 0019    GFRNONAA >60 11/21/2018 2202   GFRNONAA >60 09/15/2014 0019   GFRAA >60 11/21/2018 2202   GFRAA >60 09/15/2014 0019    Imaging Studies: No results found.  Assessment and Plan:   Vicki Adams is a 23 y.o. y/o female has been referred for rectal bleeding .     Plan :   1. Colonoscopy  in first week of July  2. Counseled on high fiber diet    I have discussed alternative options, risks & benefits,  which include, but are not limited to, bleeding, infection, perforation,respiratory complication & drug reaction.  The patient agrees with this plan & written consent will be obtained.    I discussed the assessment and treatment plan with the patient. The patient was provided an opportunity to ask questions and all were answered. The patient agreed with the plan and demonstrated an understanding of the instructions.   The patient was advised to call back or seek an in-person evaluation if the symptoms worsen or if the condition fails to improve as anticipated.     Dr Jonathon Bellows MD,MRCP [facility]) Gastroenterology/Hepatology Pager: [phone]   Speech recognition software was used to dictate the above note.

## 2018-12-05 NOTE — Telephone Encounter (Signed)
   Primary Cardiologist: Dr. Rockey Situ  Chart reviewed as part of pre-operative protocol coverage. Given past medical history and time since last visit, based on ACC/AHA guidelines, Carlia Bomkamp would be at acceptable risk for the planned procedure without further cardiovascular testing.   I will route this recommendation to the requesting party via Epic fax function and remove from pre-op pool.  Please call with questions.  [place], [place] 12/05/2018, 1:37 PM

## 2018-12-16 ENCOUNTER — Other Ambulatory Visit: Payer: Self-pay

## 2018-12-16 ENCOUNTER — Other Ambulatory Visit
Admission: RE | Admit: 2018-12-16 | Discharge: 2018-12-16 | Disposition: A | Discharge status: Home / Self Care | Payer: Medicaid Other | Source: Ambulatory Visit | Attending: Gastroenterology | Admitting: Gastroenterology

## 2018-12-16 DIAGNOSIS — Z1159 Encounter for screening for other viral diseases: Secondary | ICD-10-CM | POA: Insufficient documentation

## 2018-12-17 LAB — NOVEL CORONAVIRUS, NAA (HOSP ORDER, SEND-OUT TO REF LAB; TAT 18-24 HRS): SARS-CoV-2, NAA: NOT DETECTED

## 2018-12-20 ENCOUNTER — Encounter: Payer: Self-pay | Admitting: Emergency Medicine

## 2018-12-21 ENCOUNTER — Ambulatory Visit: Payer: Medicaid Other | Admitting: Anesthesiology

## 2018-12-21 ENCOUNTER — Ambulatory Visit
Admission: RE | Admit: 2018-12-21 | Discharge: 2018-12-21 | Disposition: A | Discharge status: Home / Self Care | Payer: Medicaid Other | Attending: Gastroenterology | Admitting: Gastroenterology

## 2018-12-21 ENCOUNTER — Encounter
Admission: RE | Disposition: A | Discharge status: Home / Self Care | Payer: Self-pay | Source: Home / Self Care | Attending: Gastroenterology

## 2018-12-21 ENCOUNTER — Encounter: Payer: Self-pay | Admitting: Anesthesiology

## 2018-12-21 DIAGNOSIS — D649 Anemia, unspecified: Secondary | ICD-10-CM | POA: Insufficient documentation

## 2018-12-21 DIAGNOSIS — K625 Hemorrhage of anus and rectum: Secondary | ICD-10-CM | POA: Insufficient documentation

## 2018-12-21 DIAGNOSIS — K640 First degree hemorrhoids: Secondary | ICD-10-CM | POA: Diagnosis not present

## 2018-12-21 HISTORY — PX: COLONOSCOPY WITH PROPOFOL: SHX5780

## 2018-12-21 LAB — POCT PREGNANCY, URINE: Preg Test, Ur: NEGATIVE

## 2018-12-21 SURGERY — COLONOSCOPY WITH PROPOFOL
Anesthesia: General

## 2018-12-21 MED ORDER — PROPOFOL 10 MG/ML IV BOLUS
INTRAVENOUS | Status: DC | PRN
  Administered 2018-12-21: 80 mg via INTRAVENOUS
  Administered 2018-12-21: 20 mg via INTRAVENOUS
  Administered 2018-12-21: 35 mg via INTRAVENOUS

## 2018-12-21 MED ORDER — MIDAZOLAM HCL 2 MG/2ML IJ SOLN
INTRAMUSCULAR | Status: DC | PRN
  Administered 2018-12-21: 2 mg via INTRAVENOUS

## 2018-12-21 MED ORDER — SODIUM CHLORIDE 0.9 % IV SOLN
INTRAVENOUS | Status: DC
  Administered 2018-12-21: 14:00:00 1000 mL via INTRAVENOUS

## 2018-12-21 MED ORDER — LIDOCAINE HCL (CARDIAC) PF 100 MG/5ML IV SOSY
PREFILLED_SYRINGE | INTRAVENOUS | Status: DC | PRN
  Administered 2018-12-21: 50 mg via INTRAVENOUS

## 2018-12-21 MED ORDER — MIDAZOLAM HCL 2 MG/2ML IJ SOLN
INTRAMUSCULAR | Status: AC
  Filled 2018-12-21: qty 2

## 2018-12-21 MED ORDER — PROPOFOL 10 MG/ML IV BOLUS
INTRAVENOUS | Status: AC
  Filled 2018-12-21: qty 40

## 2018-12-21 MED ORDER — PROPOFOL 500 MG/50ML IV EMUL
INTRAVENOUS | Status: DC | PRN
  Administered 2018-12-21: 150 ug/kg/min via INTRAVENOUS

## 2018-12-21 NOTE — Anesthesia Preprocedure Evaluation (Signed)
Anesthesia Evaluation  Patient identified by MRN, date of birth, ID band Patient awake    Reviewed: Allergy & Precautions, NPO status , Patient's Chart, lab work & pertinent test results, reviewed documented beta blocker date and time   Airway Mallampati: II  TM Distance: >3 FB     Dental  (+) Chipped   Pulmonary           Cardiovascular      Neuro/Psych    GI/Hepatic   Endo/Other    Renal/GU      Musculoskeletal   Abdominal   Peds  Hematology  (+) anemia ,   Anesthesia Other Findings EF 60-65 3 yrs ago.  Reproductive/Obstetrics                             Anesthesia Physical Anesthesia Plan  ASA: II  Anesthesia Plan: General   Post-op Pain Management:    Induction: Intravenous  PONV Risk Score and Plan:   Airway Management Planned:   Additional Equipment:   Intra-op Plan:   Post-operative Plan:   Informed Consent: I have reviewed the patients History and Physical, chart, labs and discussed the procedure including the risks, benefits and alternatives for the proposed anesthesia with the patient or authorized representative who has indicated his/her understanding and acceptance.       Plan Discussed with: CRNA  Anesthesia Plan Comments:         Anesthesia Quick Evaluation

## 2018-12-21 NOTE — H&P (Signed)
Jonathon Bellows, MD [address], [place], [place], [place], [address] [address], [place], [place], [place], [address] Phone: [phone]  Fax: [phone]  Primary Care Physician:  Malachy Mood, MD   Pre-Procedure History & Physical: HPI:  Vicki Adams is a 23 y.o. female is here for an colonoscopy.   Past Medical History:  Diagnosis Date  . Anemia   . Secundum ASD    a. 11/2000 s/p closure @ [facility]; b. 04/2015 Echo: Nl LV/RV fxn. Mild PR, triv TR. Stable ASD occluder; b. 01/2016 Echo: EF 60-65%, no rwma.  . Syncope and collapse    a. With second pregnancy-->02/2016 Holter: sinus rhythm, elevated HR likely 2/2 pregnancy. No arrhythmias.    Past Surgical History:  Procedure Laterality Date  . CARDIAC SURGERY  11/2000  . HERNIA REPAIR  1995-11-23   congential diaphragmatic hernia repair done at 76 weeks of age at [facility]  . TUBAL LIGATION Bilateral 05/09/2018   Procedure: POST PARTUM TUBAL LIGATION;  Surgeon: Malachy Mood, MD;  Location: [facility] ORS;  Service: Gynecology;  Laterality: Bilateral;    Prior to Admission medications   Medication Sig Start Date End Date Taking? Authorizing Provider  hydrocortisone-pramoxine [facility]) 2.5-1 % rectal cream Place rectally 3 (three) times daily. 09/22/13  Yes Copland, Elmo Putt B, PA-C  cyclobenzaprine (FLEXERIL) 10 MG tablet Take 1 tablet (10 mg total) by mouth 3 (three) times daily as needed for muscle spasms. Patient not taking: Reported on 11/23/2018 08/31/18   Earleen Newport, MD    Allergies as of 12/05/2018 - Review Complete 12/05/2018  Allergen Reaction Noted  . Erythromycin Anaphylaxis, Swelling, and Nausea And Vomiting 11/15/2014    Family History  Problem Relation Age of Onset  . Migraines Mother   . Kidney disease Father   . Obesity Father   . Heart failure Father   . Scoliosis Sister   . Emphysema Maternal Grandmother   . Heart failure Maternal Grandmother     Social History   Socioeconomic  History  . Marital status: Single    Spouse name: Not on file  . Number of children: 2  . Years of education: 8  . Highest education level: Not on file  Occupational History  . Occupation: SECURITY GUARD    Employer: [employer]    Comment: [employer]ARD  Social Needs  . Financial resource strain: Not on file  . Food insecurity    Worry: Not on file    Inability: Not on file  . Transportation needs    Medical: Not on file    Non-medical: Not on file  Tobacco Use  . Smoking status: Never Smoker  . Smokeless tobacco: Never Used  Substance and Sexual Activity  . Alcohol use: No    Comment: occ  . Drug use: No    Comment: ACID  . Sexual activity: Yes    Birth control/protection: Other-see comments, Surgical    Comment: Tubal ligation  Lifestyle  . Physical activity    Days per week: Not on file    Minutes per session: Not on file  . Stress: Not on file  Relationships  . Social Herbalist on phone: Not on file    Gets together: Not on file    Attends religious service: Not on file    Active member of club or organization: Not on file    Attends meetings of clubs or organizations: Not on file    Relationship status:  Not on file  . Intimate partner violence    Fear of current or ex partner: Not on file    Emotionally abused: Not on file    Physically abused: Not on file    Forced sexual activity: Not on file  Other Topics Concern  . Not on file  Social History Narrative  . Not on file    Review of Systems: See HPI, otherwise negative ROS  Physical Exam: BP 119/64   Pulse 64   Temp (!) 96.3 F (35.7 C) (Tympanic)   Resp 20   Ht 5\' 7"  (1.702 m)   Wt 81.6 kg   LMP 12/18/2018 (Exact Date)   SpO2 98%   Breastfeeding No   BMI 28.19 kg/m  General:   Alert,  pleasant and cooperative in NAD Head:  Normocephalic and atraumatic. Neck:  Supple; no masses or thyromegaly. Lungs:  Clear throughout to auscultation, normal respiratory effort.     Heart:  +S1, +S2, Regular rate and rhythm, No edema. Abdomen:  Soft, nontender and nondistended. Normal bowel sounds, without guarding, and without rebound.   Neurologic:  Alert and  oriented x4;  grossly normal neurologically.  Impression/Plan: Vicki Adams is here for an colonoscopy to be performed for rectal bleeding  Risks, benefits, limitations, and alternatives regarding  colonoscopy have been reviewed with the patient.  Questions have been answered.  All parties agreeable.   Wyline MoodKiran Genova Kiner, MD  12/21/2018, 2:33 PM

## 2018-12-21 NOTE — Transfer of Care (Signed)
Immediate Anesthesia Transfer of Care Note  Patient: Vicki Adams  Procedure(s) Performed: COLONOSCOPY WITH PROPOFOL (N/A )  Patient Location: PACU and Endoscopy Unit  Anesthesia Type:General  Level of Consciousness: drowsy  Airway & Oxygen Therapy: Patient Spontanous Breathing  Post-op Assessment: Report given to RN and Post -op Vital signs reviewed and stable  Post vital signs: Reviewed and stable  Last Vitals: see nursing vital sign flow sheet Vitals Value Taken Time  BP    Temp    Pulse    Resp    SpO2      Last Pain:  Vitals:   12/21/18 1357  TempSrc: Tympanic      Patients Stated Pain Goal: 0 (42/87/68 1157)  Complications: No apparent anesthesia complications

## 2018-12-21 NOTE — Op Note (Signed)
[facility] Patient Name: Kalyn Hofstra Procedure Date: 12/21/2018 2:29 PM MRN: [id number] Account #: [id number] Date of Birth: Feb 27, 1996 Admit Type: Outpatient Age: 23 Room: [facility] ENDO ROOM 2 Gender: Female Note Status: Finalized Procedure:            Colonoscopy Indications:          Rectal bleeding Providers:            Jonathon Bellows MD, MD Medicines:            Monitored Anesthesia Care Complications:        No immediate complications. Procedure:            Pre-Anesthesia Assessment:                       - Prior to the procedure, a History and Physical was                        performed, and patient medications, allergies and                        sensitivities were reviewed. The patient's tolerance of                        previous anesthesia was reviewed.                       - The risks and benefits of the procedure and the                        sedation options and risks were discussed with the                        patient. All questions were answered and informed                        consent was obtained.                       - ASA Grade Assessment: II - A patient with mild                        systemic disease.                       After obtaining informed consent, the colonoscope was                        passed under direct vision. Throughout the procedure,                        the patient's blood pressure, pulse, and oxygen                        saturations were monitored continuously. The                        Colonoscope was introduced through the anus and                        advanced to the the cecum, identified by the  appendiceal orifice, IC valve and transillumination.                        The colonoscopy was performed with ease. The patient                        tolerated the procedure well. The quality of the bowel                        preparation was excellent. Findings:  The perianal and digital rectal examinations were normal.      Non-bleeding internal hemorrhoids were found during retroflexion. The       hemorrhoids were small and Grade I (internal hemorrhoids that do not       prolapse).      The exam was otherwise without abnormality on direct and retroflexion       views. Impression:           - Non-bleeding internal hemorrhoids.                       - The examination was otherwise normal on direct and                        retroflexion views.                       - No specimens collected. Recommendation:       - Discharge patient to home (with escort).                       - Resume previous diet.                       - Continue present medications.                       - Return to my office in 2 weeks. Procedure Code(s):    --- Professional ---                       [phone] [id number], Colonoscopy, flexible; diagnostic, including                        collection of specimen(s) by brushing or washing, when                        performed (separate procedure) Diagnosis Code(s):    --- Professional ---                       K64.0, First degree hemorrhoids                       K62.5, Hemorrhage of anus and rectum CPT copyright 2019 American Medical Association. All rights reserved. The codes documented in this report are preliminary and upon coder review may  be revised to meet current compliance requirements. Wyline MoodKiran Jackqulyn Mendel, MD Wyline MoodKiran Aury Scollard MD, MD 12/21/2018 2:58:38 PM This report has been signed electronically. Number of Addenda: 0 Note Initiated On: 12/21/2018 2:29 PM Scope Withdrawal Time: 0 hours 6 minutes 36 seconds  Total Procedure Duration: 0 hours 11 minutes 35 seconds  Estimated Blood Loss: Estimated blood loss: none.      [place]  [facility]

## 2018-12-21 NOTE — Anesthesia Post-op Follow-up Note (Signed)
Anesthesia QCDR form completed.        

## 2018-12-22 ENCOUNTER — Encounter: Payer: Self-pay | Admitting: Gastroenterology

## 2018-12-22 NOTE — Anesthesia Postprocedure Evaluation (Signed)
Anesthesia Post Note  Patient: Vicki Adams  Procedure(s) Performed: COLONOSCOPY WITH PROPOFOL (N/A )  Patient location during evaluation: Endoscopy Anesthesia Type: General Level of consciousness: awake and alert Pain management: pain level controlled Vital Signs Assessment: post-procedure vital signs reviewed and stable Respiratory status: spontaneous breathing, nonlabored ventilation, respiratory function stable and patient connected to nasal cannula oxygen Cardiovascular status: blood pressure returned to baseline and stable Postop Assessment: no apparent nausea or vomiting Anesthetic complications: no     Last Vitals:  Vitals:   12/21/18 1523 12/21/18 1533  BP: 102/73 107/80  Pulse: (!) 58 (!) 53  Resp: 15 13  Temp:    SpO2: 100% 100%    Last Pain:  Vitals:   12/21/18 1533  TempSrc:   PainSc: 0-No pain                 Genelle Economou S

## 2019-01-02 ENCOUNTER — Encounter: Payer: Self-pay | Admitting: Obstetrics and Gynecology

## 2019-01-02 ENCOUNTER — Other Ambulatory Visit: Payer: Self-pay

## 2019-01-02 ENCOUNTER — Ambulatory Visit (INDEPENDENT_AMBULATORY_CARE_PROVIDER_SITE_OTHER): Payer: Medicaid Other | Admitting: Obstetrics and Gynecology

## 2019-01-02 VITALS — BP 104/66 | Wt 182.0 lb

## 2019-01-02 DIAGNOSIS — Z01419 Encounter for gynecological examination (general) (routine) without abnormal findings: Secondary | ICD-10-CM

## 2019-01-02 DIAGNOSIS — Z1239 Encounter for other screening for malignant neoplasm of breast: Secondary | ICD-10-CM

## 2019-01-02 DIAGNOSIS — Z0000 Encounter for general adult medical examination without abnormal findings: Secondary | ICD-10-CM | POA: Diagnosis not present

## 2019-01-02 DIAGNOSIS — F411 Generalized anxiety disorder: Secondary | ICD-10-CM

## 2019-01-02 MED ORDER — ESCITALOPRAM OXALATE 10 MG PO TABS: 10.0000 mg | ORAL_TABLET | Freq: Every day | ORAL | 3 refills | Status: DC

## 2019-01-02 NOTE — Progress Notes (Signed)
Gynecology Annual Exam  PCP: Malachy Mood, MD  Chief Complaint:  Chief Complaint  Patient presents with  . Gynecologic Exam    Colonoscopy d/t rectal bleeding    History of Present Illness: Patient is a 23 y.o. O1Y0737 presents for annual exam. The patient has no complaints today.   LMP: Patient's last menstrual period was 12/18/2018 (exact date). Average Interval: regular, 28 days Duration of flow: 3 days Heavy Menses: no Clots: no Intermenstrual Bleeding: no Postcoital Bleeding: no Dysmenorrhea: no  The patient is sexually active. She currently uses tubal ligation for contraception. She has dyspareunia. There is no notable family history of breast or ovarian cancer in her family.  The patient wears seatbelts: yes.  The patient has regular exercise: not asked.    The patient repots current symptoms of depression.    Review of Systems: Review of Systems  Constitutional: Negative for chills and fever.  HENT: Negative for congestion.   Respiratory: Negative for cough and shortness of breath.   Cardiovascular: Negative for chest pain and palpitations.  Gastrointestinal: Negative for abdominal pain, constipation, diarrhea, heartburn, nausea and vomiting.  Genitourinary: Negative for dysuria, frequency and urgency.  Skin: Negative for itching and rash.  Neurological: Negative for dizziness and headaches.  Endo/Heme/Allergies: Negative for polydipsia.  Psychiatric/Behavioral: Negative for depression. The patient is nervous/anxious.     Past Medical History:  Past Medical History:  Diagnosis Date  . Anemia   . Secundum ASD    a. 11/2000 s/p closure @ [facility]; b. 04/2015 Echo: Nl LV/RV fxn. Mild PR, triv TR. Stable ASD occluder; b. 01/2016 Echo: EF 60-65%, no rwma.  . Syncope and collapse    a. With second pregnancy-->02/2016 Holter: sinus rhythm, elevated HR likely 2/2 pregnancy. No arrhythmias.    Past Surgical History:  Past Surgical History:  Procedure Laterality  Date  . CARDIAC SURGERY  11/2000  . COLONOSCOPY WITH PROPOFOL N/A 12/21/2018   Procedure: COLONOSCOPY WITH PROPOFOL;  Surgeon: Jonathon Bellows, MD;  Location: [facility] ENDOSCOPY;  Service: Gastroenterology;  Laterality: N/A;  . HERNIA REPAIR  07-12-1995   congential diaphragmatic hernia repair done at 27 weeks of age at [facility]  . TUBAL LIGATION Bilateral 05/09/2018   Procedure: POST PARTUM TUBAL LIGATION;  Surgeon: Malachy Mood, MD;  Location: [facility] ORS;  Service: Gynecology;  Laterality: Bilateral;    Gynecologic History:  Patient's last menstrual period was 12/18/2018 (exact date). Contraception: tubal ligation Last Pap: Results were: 09/07/2017 no abnormalities   Obstetric History: [id number]  Family History:  Family History  Problem Relation Age of Onset  . Migraines Mother   . Kidney disease Father   . Obesity Father   . Heart failure Father   . Scoliosis Sister   . Emphysema Maternal Grandmother   . Heart failure Maternal Grandmother     Social History:  Social History   Socioeconomic History  . Marital status: Single    Spouse name: Not on file  . Number of children: 2  . Years of education: 51  . Highest education level: Not on file  Occupational History  . 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 Financial resource strain: Not on file  . Food insecurity    Worry: Not on file    Inability: Not on file  . Transportation needs    Medical: Not on file    Non-medical: Not on file  Tobacco Use  .  Smoking status: Never Smoker  . Smokeless tobacco: Never Used  Substance and Sexual Activity  . Alcohol use: Yes    Comment: occ  . Drug use: No    Comment: ACID  . Sexual activity: Yes    Birth control/protection: Other-see comments, Surgical    Comment: Tubal ligation  Lifestyle  . Physical activity    Days per week: Not on file    Minutes per session: Not on file  . Stress: Not on file  Relationships   . Social Musicianconnections    Talks on phone: Not on file    Gets together: Not on file    Attends religious service: Not on file    Active member of club or organization: Not on file    Attends meetings of clubs or organizations: Not on file    Relationship status: Not on file  . Intimate partner violence    Fear of current or ex partner: Not on file    Emotionally abused: Not on file    Physically abused: Not on file    Forced sexual activity: Not on file  Other Topics Concern  . Not on file  Social History Narrative  . Not on file    Allergies:  Allergies  Allergen Reactions  . Erythromycin Anaphylaxis, Swelling and Nausea And Vomiting    Medications: Prior to Admission medications   Not on File    Physical Exam Vitals: Blood pressure 104/66, weight 182 lb (82.6 kg), last menstrual period 12/18/2018, not currently breastfeeding.  General: NAD HEENT: normocephalic, anicteric Thyroid: no enlargement, no palpable nodules Pulmonary: No increased work of breathing, CTAB Cardiovascular: RRR, distal pulses 2+ Breast: Breast symmetrical, no tenderness, no palpable nodules or masses, no skin or nipple retraction present, no nipple discharge.  No axillary or supraclavicular lymphadenopathy. Abdomen: NABS, soft, non-tender, non-distended.  Umbilicus without lesions.  No hepatomegaly, splenomegaly or masses palpable. No evidence of hernia  Genitourinary:  External: Normal external female genitalia.  Normal urethral meatus, normal Bartholin's and Skene's glands.    Vagina: Normal vaginal mucosa, no evidence of prolapse.    Cervix: Grossly normal in appearance, no bleeding  Uterus: Non-enlarged, mobile, normal contour.  No CMT  Adnexa: ovaries non-enlarged, no adnexal masses  Rectal: deferred  Lymphatic: no evidence of inguinal lymphadenopathy Extremities: no edema, erythema, or tenderness Neurologic: Grossly intact Psychiatric: mood appropriate, affect full  Female chaperone  present for pelvic and breast  portions of the physical exam    Assessment: 23 y.o. G3P3003 routine annual exam  Plan: Problem List Items Addressed This Visit    None    Visit Diagnoses    Generalized anxiety disorder    -  Primary   Relevant Medications   escitalopram (LEXAPRO) 10 MG tablet   Encounter for gynecological examination without abnormal finding       Breast screening          1) STI screening  wasoffered and declined   2) Generalized anxiety - start lexapro 10mg  po  3)  ASCCP guidelines and rational discussed.  Patient opts for every 3 years screening interval  4) Contraception - the patient is currently using  tubal ligation.  She is not currently in need of contraception secondary to being sterile We discussed safe sex practices to reduce her furture risk of STI's.    5) Return in about 4 weeks (around 01/30/2019) for medication follow up phone.   Vena AustriaAndreas Eliza Green, MD, Merlinda FrederickFACOG [facility], [facility]

## 2019-01-19 ENCOUNTER — Other Ambulatory Visit: Payer: Self-pay

## 2019-01-19 ENCOUNTER — Ambulatory Visit (INDEPENDENT_AMBULATORY_CARE_PROVIDER_SITE_OTHER): Payer: Medicaid Other | Admitting: Gastroenterology

## 2019-01-19 ENCOUNTER — Encounter: Payer: Self-pay | Admitting: Gastroenterology

## 2019-01-19 VITALS — BP 116/74 | HR 97 | Temp 98.1°F | Ht 67.0 in | Wt 187.8 lb

## 2019-01-19 DIAGNOSIS — K648 Other hemorrhoids: Secondary | ICD-10-CM

## 2019-01-19 DIAGNOSIS — K625 Hemorrhage of anus and rectum: Secondary | ICD-10-CM | POA: Diagnosis not present

## 2019-01-19 NOTE — Progress Notes (Signed)
   Jonathon Bellows MD, MRCP(U.K) [address]  [place]  [place], [place] [address]  Main: [phone]  Fax: [phone]   Primary Care Physician: Malachy Mood, MD  Primary Gastroenterologist:  Dr. Jonathon Bellows   Chief Complaint  Patient presents with  . Follow-up    Rectal bleeding    HPI: Ticara Waner is a 23 y.o. female was initially referred and seen back in June 2020 for rectal bleeding.  On a colonoscopy on 12/21/2018 noted grade 1 internal hemorrhoids nonbleeding small in size.  Since her colonoscopy she has had no issues hemorrhoids no bleeding.  Current Outpatient Medications  Medication Sig Dispense Refill  . escitalopram (LEXAPRO) 10 MG tablet Take 1 tablet (10 mg total) by mouth daily. 30 tablet 3   No current facility-administered medications for this visit.     Allergies as of 01/19/2019 - Review Complete 01/19/2019  Allergen Reaction Noted  . Erythromycin Anaphylaxis, Swelling, and Nausea And Vomiting 11/15/2014    ROS:  General: Negative for anorexia, weight loss, fever, chills, fatigue, weakness. ENT: Negative for hoarseness, difficulty swallowing , nasal congestion. CV: Negative for chest pain, angina, palpitations, dyspnea on exertion, peripheral edema.  Respiratory: Negative for dyspnea at rest, dyspnea on exertion, cough, sputum, wheezing.  GI: See history of present illness. GU:  Negative for dysuria, hematuria, urinary incontinence, urinary frequency, nocturnal urination.  Endo: Negative for unusual weight change.    Physical Examination:   BP 116/74   Pulse 97   Temp 98.1 F (36.7 C)   Ht 5\' 7"  (1.702 m)   Wt 187 lb 12.8 oz (85.2 kg)   BMI 29.41 kg/m   General: Well-nourished, well-developed in no acute distress.  Eyes: No icterus. Conjunctivae pink. Mouth: Oropharyngeal mucosa moist and pink , no lesions erythema or exudate. Lungs: Clear to auscultation bilaterally. Non-labored. Heart: Regular rate and rhythm, no murmurs  rubs or gallops.  Abdomen: Bowel sounds are normal, nontender, nondistended, no hepatosplenomegaly or masses, no abdominal bruits or hernia , no rebound or guarding.   Extremities: No lower extremity edema. No clubbing or deformities. Neuro: Alert and oriented x 3.  Grossly intact. Skin: Warm and dry, no jaundice.   Psych: Alert and cooperative, normal mood and affect.   Imaging Studies: No results found.  Assessment and Plan:   Xariah Silvernail is a 23 y.o. y/o female here to see me today for rectal bleeding secondary to small internal hemorrhoids seen on colonoscopy on 12/21/2018.  Plan 1.  Discussed conservative management of hemorrhoids including the use of stool softeners, sitz bath, perianal hygiene and possibly trial of Anusol suppository.  If fails discussed options of hemorrhoidal banding.   Dr Jonathon Bellows  MD,MRCP [facility]) Follow up in as needed

## 2019-01-24 ENCOUNTER — Other Ambulatory Visit: Payer: Self-pay | Admitting: Obstetrics and Gynecology

## 2019-01-24 NOTE — Telephone Encounter (Signed)
Advise

## 2019-01-30 ENCOUNTER — Other Ambulatory Visit: Payer: Self-pay

## 2019-01-30 ENCOUNTER — Ambulatory Visit (INDEPENDENT_AMBULATORY_CARE_PROVIDER_SITE_OTHER): Payer: Medicaid Other | Admitting: Obstetrics and Gynecology

## 2019-01-30 DIAGNOSIS — M25559 Pain in unspecified hip: Secondary | ICD-10-CM

## 2019-01-30 DIAGNOSIS — F411 Generalized anxiety disorder: Secondary | ICD-10-CM

## 2019-01-30 MED ORDER — SERTRALINE HCL 25 MG PO TABS: 25.0000 mg | ORAL_TABLET | Freq: Every day | ORAL | 30 refills | Status: DC

## 2019-01-30 NOTE — Progress Notes (Signed)
I connected with Vicki Adams  on 02/01/19 at 10:50 AM EDT by telephone and verified that I am speaking with the correct person using two identifiers.   I discussed the limitations, risks, security and privacy concerns of performing an evaluation and management service by telephone and the availability of in person appointments. I also discussed with the patient that there may be a patient responsible charge related to this service. The patient expressed understanding and agreed to proceed.  The patient was at home I spoke with the patient from my workstation phone The names of people involved in this encounter were: Vicki Adams , and Vena AustriaAndreas Jacorian Golaszewski    Obstetrics & Gynecology Office Visit   Chief Complaint:  Chief Complaint  Patient presents with  . Follow-up    Med was giving headaches and breaking out in rash    History of Present Illness: The patient is a 23 y.o. female presenting follow up for symptoms of anxiety and depression.  The patient was  taking lexapro for the management of her symptoms, but stopped secondary to nausea.  She has not had any recent situational stressors.  She reports symptoms of anhedonia, insomnia, irritability, social anxiety, feelings of guilt and feelings of worthlessness.  She denies risk taking behavior, agorophobia, suicidal ideation, homicidal ideation, auditory hallucinations and visual hallucinations. Symptoms have worsened since last visit.     The patient does not have a pre-existing history of depression and anxiety.  She  does not a prior history of suicide attempts.    Review of Systems: Review of Systems  All other systems reviewed and are negative.    Past Medical History:  Past Medical History:  Diagnosis Date  . Anemia   . Secundum ASD    a. 11/2000 s/p closure @ [facility]; b. 04/2015 Echo: Nl LV/RV fxn. Mild PR, triv TR. Stable ASD occluder; b. 01/2016 Echo: EF 60-65%, no rwma.  . Syncope and collapse    a. With  second pregnancy-->02/2016 Holter: sinus rhythm, elevated HR likely 2/2 pregnancy. No arrhythmias.    Past Surgical History:  Past Surgical History:  Procedure Laterality Date  . CARDIAC SURGERY  11/2000  . COLONOSCOPY WITH PROPOFOL N/A 12/21/2018   Procedure: COLONOSCOPY WITH PROPOFOL;  Surgeon: Wyline MoodAnna, Kiran, MD;  [facility];  Service: Gastroenterology;  Laterality: N/A;  . HERNIA REPAIR  06/1995   congential diaphragmatic hernia repair done at 472 weeks of age at [facility]  . TUBAL LIGATION Bilateral 05/09/2018   Procedure: POST PARTUM TUBAL LIGATION;  Surgeon: Vena AustriaStaebler, Corena Tilson, MD;  Location: [facility] ORS;  Service: Gynecology;  Laterality: Bilateral;    Gynecologic History: No LMP recorded.  Obstetric History: [id number]  Family History:  Family History  Problem Relation Age of Onset  . Migraines Mother   . Kidney disease Father   . Obesity Father   . Heart failure Father   . Scoliosis Sister   . Emphysema Maternal Grandmother   . Heart failure Maternal Grandmother     Social History:  Social History   Socioeconomic History  . Marital status: Single    Spouse name: Not on file  . Number of children: 2  . Years of education: 6612  . Highest education level: Not on file  Occupational History  . 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 Financial resource strain: Not on file  . Food insecurity    Worry: Not  on file    Inability: Not on file  . Transportation needs    Medical: Not on file    Non-medical: Not on file  Tobacco Use  . Smoking status: Never Smoker  . Smokeless tobacco: Never Used  Substance and Sexual Activity  . Alcohol use: Yes    Comment: occ  . Drug use: No    Comment: ACID  . Sexual activity: Yes    Birth control/protection: Other-see comments, Surgical    Comment: Tubal ligation  Lifestyle  . Physical activity    Days per week: Not on file    Minutes per session: Not on  file  . Stress: Not on file  Relationships  . Social Herbalist on phone: Not on file    Gets together: Not on file    Attends religious service: Not on file    Active member of club or organization: Not on file    Attends meetings of clubs or organizations: Not on file    Relationship status: Not on file  . Intimate partner violence    Fear of current or ex partner: Not on file    Emotionally abused: Not on file    Physically abused: Not on file    Forced sexual activity: Not on file  Other Topics Concern  . Not on file  Social History Narrative  . Not on file    Allergies:  Allergies  Allergen Reactions  . Erythromycin Anaphylaxis, Swelling and Nausea And Vomiting    Medications: Prior to Admission medications   Medication Sig Start Date End Date Taking? Authorizing Provider  escitalopram (LEXAPRO) 10 MG tablet TAKE 1 TABLET BY MOUTH EVERY DAY Patient not taking: Reported on 01/30/2019 01/24/19   Malachy Mood, MD    Physical Exam Vitals: There were no vitals filed for this visit. No LMP recorded.  No physical exam as this was a remote telephone visit to promote social distancing during the current COVID-19 Pandemic    GAD 7 : Generalized Anxiety Score 01/30/2019  Nervous, Anxious, on Edge 1  Control/stop worrying 1  Worry too much - different things 1  Trouble relaxing 1  Restless 1  Easily annoyed or irritable 3  Afraid - awful might happen 1  Total GAD 7 Score 9  Anxiety Difficulty Very difficult    No flowsheet data found.  No flowsheet data found.   Assessment: 23 y.o. G3P3003 follow up anxiety/depression  Plan: Problem List Items Addressed This Visit    None    Visit Diagnoses    Arthralgia of hip, unspecified laterality    -  Primary   Relevant Orders   AMB referral to orthopedics   Generalized anxiety disorder       Relevant Medications   sertraline (ZOLOFT) 25 MG tablet      1) Anxiety = Rx Zoloft 25mg  discontinued  lexapro   2) Thyroid and B12 screen has not been obtained previously  3) Telephone Time 8:32 minutes  4) Return in about 3 weeks (around 02/20/2019) for 2-3 week medication follow up phone.    Malachy Mood, MD, [facility], [place] 01/30/2019, 11:54 AM

## 2019-03-16 ENCOUNTER — Other Ambulatory Visit: Payer: Self-pay | Admitting: Obstetrics and Gynecology

## 2019-03-16 NOTE — Telephone Encounter (Signed)
Please advise 

## 2019-07-12 ENCOUNTER — Ambulatory Visit (INDEPENDENT_AMBULATORY_CARE_PROVIDER_SITE_OTHER): Payer: Medicaid Other | Admitting: Obstetrics and Gynecology

## 2019-07-12 ENCOUNTER — Other Ambulatory Visit: Payer: Self-pay

## 2019-07-12 DIAGNOSIS — F411 Generalized anxiety disorder: Secondary | ICD-10-CM | POA: Diagnosis not present

## 2019-07-12 MED ORDER — HYDROXYZINE HCL 25 MG PO TABS: 25.0000 mg | ORAL_TABLET | Freq: Four times a day (QID) | ORAL | 2 refills | Status: DC | PRN

## 2019-07-12 MED ORDER — BUPROPION HCL ER (XL) 150 MG PO TB24: 150.0000 mg | ORAL_TABLET | Freq: Every day | ORAL | 11 refills | Status: DC

## 2019-07-12 NOTE — Progress Notes (Signed)
I connected with Vicki Adams on 07/12/19 at  9:50 AM EST by telephone and verified that I am speaking with the correct person using two identifiers.   I discussed the limitations, risks, security and privacy concerns of performing an evaluation and management service by telephone and the availability of in person appointments. I also discussed with the patient that there may be a patient responsible charge related to this service. The patient expressed understanding and agreed to proceed.  The patient was at home I spoke with the patient from my workstation phone The names of people involved in this encounter were: Vicki Adams , and Vena Austria   Obstetrics & Gynecology Office Visit   Chief Complaint:  Chief Complaint  Patient presents with  . Follow-up    Anxiety/depression    History of Present Illness: The patient is a 24 y.o. female presenting follow up for symptoms of anxiety and depression.  The patient is currently taking nothing for the management of her symptoms.  Was started on Zoloft 25mg  but self discontinued secondary to headaches. She has not had any recent situational stressors.  She reports symptoms of day time somnolence, irritability and social anxiety.  She denies anhedonia, day time somnolence, agorophobia, feelings of guilt, feelings of worthlessness, suicidal ideation, homicidal ideation, auditory hallucinations and visual hallucinations. Symptoms have remained unchanged since last visit.     Review of Systems: Review of Systems  Constitutional: Negative.   Neurological: Positive for headaches.  Psychiatric/Behavioral: Negative for depression, hallucinations, memory loss, substance abuse and suicidal ideas. The patient is nervous/anxious. The patient does not have insomnia.      Past Medical History:  Past Medical History:  Diagnosis Date  . Anemia   . Secundum ASD    a. 11/2000 s/p closure @ [facility]; b. 04/2015 Echo: Nl LV/RV fxn. Mild  PR, triv TR. Stable ASD occluder; b. 01/2016 Echo: EF 60-65%, no rwma.  . Syncope and collapse    a. With second pregnancy-->02/2016 Holter: sinus rhythm, elevated HR likely 2/2 pregnancy. No arrhythmias.    Past Surgical History:  Past Surgical History:  Procedure Laterality Date  . CARDIAC SURGERY  11/2000  . COLONOSCOPY WITH PROPOFOL N/A 12/21/2018   Procedure: COLONOSCOPY WITH PROPOFOL;  Surgeon: 02/21/2019, MD;  Location: [facility] ENDOSCOPY;  Service: Gastroenterology;  Laterality: N/A;  . HERNIA REPAIR  September 13, 1995   congential diaphragmatic hernia repair done at 64 weeks of age at [facility]  . TUBAL LIGATION Bilateral 05/09/2018   Procedure: POST PARTUM TUBAL LIGATION;  Surgeon: 05/11/2018, MD;  Location: [facility] ORS;  Service: Gynecology;  Laterality: Bilateral;    Gynecologic History: Patient's last menstrual period was 06/21/2019.  Obstetric History: 06/23/2019  Family History:  Family History  Problem Relation Age of Onset  . Migraines Mother   . Kidney disease Father   . Obesity Father   . Heart failure Father   . Scoliosis Sister   . Emphysema Maternal Grandmother   . Heart failure Maternal Grandmother     Social History:  Social History   Socioeconomic History  . Marital status: Single    Spouse name: Not on file  . Number of children: 2  . Years of education: 17  . Highest education level: Not on file  Occupational History  . 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 Smoking status: Never Smoker  . Smokeless tobacco: Never Used  Substance  and Sexual Activity  . Alcohol use: Yes    Comment: occ  . Drug use: No    Comment: ACID  . Sexual activity: Yes    Birth control/protection: Other-see comments, Surgical    Comment: Tubal ligation  Other Topics Concern  . Not on file  Social History Narrative  . Not on file   Social Determinants of Health   Financial Resource Strain:   . Difficulty of  Paying Living Expenses: Not on file  Food Insecurity:   . Worried About Charity fundraiser in the Last Year: Not on file  . Ran Out of Food in the Last Year: Not on file  Transportation Needs:   . Lack of Transportation (Medical): Not on file  . Lack of Transportation (Non-Medical): Not on file  Physical Activity:   . Days of Exercise per Week: Not on file  . Minutes of Exercise per Session: Not on file  Stress:   . Feeling of Stress : Not on file  Social Connections:   . Frequency of Communication with Friends and Family: Not on file  . Frequency of Social Gatherings with Friends and Family: Not on file  . Attends Religious Services: Not on file  . Active Member of Clubs or Organizations: Not on file  . Attends Archivist Meetings: Not on file  . Marital Status: Not on file  Intimate Partner Violence:   . Fear of Current or Ex-Partner: Not on file  . Emotionally Abused: Not on file  . Physically Abused: Not on file  . Sexually Abused: Not on file    Allergies:  Allergies  Allergen Reactions  . Erythromycin Anaphylaxis, Swelling and Nausea And Vomiting    Medications: Prior to Admission medications   Medication Sig Start Date End Date Taking? Authorizing Provider  sertraline (ZOLOFT) 25 MG tablet TAKE 1 TABLET BY MOUTH EVERY DAY 03/16/19  Yes Malachy Mood, MD    Physical Exam Vitals: There were no vitals filed for this visit. Patient's last menstrual period was 06/21/2019.  No physical exam as this was a remote telephone visit to promote social distancing during the current COVID-19 Pandemic   GAD 7 : Generalized Anxiety Score 07/12/2019 01/30/2019  Nervous, Anxious, on Edge 3 1  Control/stop worrying 2 1  Worry too much - different things 3 1  Trouble relaxing 1 1  Restless 1 1  Easily annoyed or irritable 3 3  Afraid - awful might happen 1 1  Total GAD 7 Score 14 9  Anxiety Difficulty Very difficult Very difficult    Depression screen PHQ 2/9  07/12/2019  Decreased Interest 2  Down, Depressed, Hopeless 1  PHQ - 2 Score 3  Altered sleeping 2  Tired, decreased energy 3  Change in appetite 0  Feeling bad or failure about yourself  0  Trouble concentrating 0  Moving slowly or fidgety/restless 0  Suicidal thoughts 0  PHQ-9 Score 8  Difficult doing work/chores Very difficult    Depression screen PHQ 2/9 07/12/2019  Decreased Interest 2  Down, Depressed, Hopeless 1  PHQ - 2 Score 3  Altered sleeping 2  Tired, decreased energy 3  Change in appetite 0  Feeling bad or failure about yourself  0  Trouble concentrating 0  Moving slowly or fidgety/restless 0  Suicidal thoughts 0  PHQ-9 Score 8  Difficult doing work/chores Very difficult     Assessment: 24 y.o. [id number] follow up anxiety  Plan: Problem List Items Addressed This Visit  None    Visit Diagnoses    Generalized anxiety disorder    -  Primary   Relevant Medications   buPROPion (WELLBUTRIN XL) 150 MG 24 hr tablet   hydrOXYzine (ATARAX/VISTARIL) 25 MG tablet      1) Anxiety - patient inquired about Xanax, will start on Vistaril prn in addition to switching to Wellbutrin XL 150mg .  Irritability and fatigue are the most bothersome symptoms patient has noted.    2) Thyroid and B12 screen has not been obtained previously  3) Telephone time 7:20 minutes  4) Return in about 2 weeks (around 07/26/2019) for medication follow up phone.    09/23/2019, MD, Vena Austria [facility], [facility] 07/12/2019, 10:07 AM

## 2019-07-14 ENCOUNTER — Other Ambulatory Visit: Payer: Self-pay

## 2019-07-14 ENCOUNTER — Ambulatory Visit (INDEPENDENT_AMBULATORY_CARE_PROVIDER_SITE_OTHER): Payer: Medicaid Other | Admitting: Obstetrics and Gynecology

## 2019-07-14 ENCOUNTER — Encounter: Payer: Self-pay | Admitting: Obstetrics and Gynecology

## 2019-07-14 VITALS — BP 110/70 | Ht 67.0 in | Wt 207.0 lb

## 2019-07-14 DIAGNOSIS — L02429 Furuncle of limb, unspecified: Secondary | ICD-10-CM

## 2019-07-14 DIAGNOSIS — N926 Irregular menstruation, unspecified: Secondary | ICD-10-CM

## 2019-07-14 DIAGNOSIS — Z3202 Encounter for pregnancy test, result negative: Secondary | ICD-10-CM

## 2019-07-14 LAB — POCT URINE PREGNANCY: Preg Test, Ur: NEGATIVE

## 2019-07-14 MED ORDER — SULFAMETHOXAZOLE-TRIMETHOPRIM 800-160 MG PO TABS: 1.0000 | ORAL_TABLET | Freq: Two times a day (BID) | ORAL | 1 refills | Status: DC

## 2019-07-14 NOTE — Progress Notes (Signed)
Obstetrics & Gynecology Office Visit   Chief Complaint:  Chief Complaint  Patient presents with  . Follow-up    Medication follow up    History of Present Illness: The patient is a 24 y.o. [id number] presenting after positive home UPT in the setting of prior tubal ligation.  She reports that she took follow up home test that were negative.  She reports irregular menstrual intervals since tubal ligation.  Patient's last menstrual period was 06/21/2019.  She denies abdominal pain.  Presents today as she was told that if she missed a period and got a positive UPT following tubal ligation this was an emergency.  She has also noted a boil in her right groin that has been tender.  No fevers, chills, or discharge.  No trauma to the area that the patient can recall.  Review of Systems: Review of Systems  Constitutional: Negative.   Gastrointestinal: Negative.   Genitourinary: Negative.   Skin: Positive for rash. Negative for itching.    Past Medical History:  Past Medical History:  Diagnosis Date  . Anemia   . Secundum ASD    a. 11/2000 s/p closure @ [facility]; b. 04/2015 Echo: Nl LV/RV fxn. Mild PR, triv TR. Stable ASD occluder; b. 01/2016 Echo: EF 60-65%, no rwma.  . Syncope and collapse    a. With second pregnancy-->02/2016 Holter: sinus rhythm, elevated HR likely 2/2 pregnancy. No arrhythmias.    Past Surgical History:  Past Surgical History:  Procedure Laterality Date  . CARDIAC SURGERY  11/2000  . COLONOSCOPY WITH PROPOFOL N/A 12/21/2018   Procedure: COLONOSCOPY WITH PROPOFOL;  Surgeon: Jonathon Bellows, MD;  Location: [facility] ENDOSCOPY;  Service: Gastroenterology;  Laterality: N/A;  . HERNIA REPAIR  Nov 20, 1995   congential diaphragmatic hernia repair done at 91 weeks of age at [facility]  . TUBAL LIGATION Bilateral 05/09/2018   Procedure: POST PARTUM TUBAL LIGATION;  Surgeon: Malachy Mood, MD;  Location: [facility] ORS;  Service: Gynecology;  Laterality: Bilateral;    Gynecologic History: Patient's  last menstrual period was 06/21/2019.  Obstetric History: [id number]  Family History:  Family History  Problem Relation Age of Onset  . Migraines Mother   . Kidney disease Father   . Obesity Father   . Heart failure Father   . Scoliosis Sister   . Emphysema Maternal Grandmother   . Heart failure Maternal Grandmother     Social History:  Social History   Socioeconomic History  . Marital status: Single    Spouse name: Not on file  . Number of children: 2  . Years of education: 41  . Highest education level: Not on file  Occupational History  . 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 Smoking status: Never Smoker  . Smokeless tobacco: Never Used  Substance and Sexual Activity  . Alcohol use: Yes    Comment: occ  . Drug use: No    Comment: ACID  . Sexual activity: Yes    Birth control/protection: Other-see comments, Surgical    Comment: Tubal ligation  Other Topics Concern  . Not on file  Social History Narrative  . Not on file   Social Determinants of Health   Financial Resource Strain:   . Difficulty of Paying Living Expenses: Not on file  Food Insecurity:   . Worried About Charity fundraiser in the Last Year: Not on file  . Ran Out of Food in  the Last Year: Not on file  Transportation Needs:   . Lack of Transportation (Medical): Not on file  . Lack of Transportation (Non-Medical): Not on file  Physical Activity:   . Days of Exercise per Week: Not on file  . Minutes of Exercise per Session: Not on file  Stress:   . Feeling of Stress : Not on file  Social Connections:   . Frequency of Communication with Friends and Family: Not on file  . Frequency of Social Gatherings with Friends and Family: Not on file  . Attends Religious Services: Not on file  . Active Member of Clubs or Organizations: Not on file  . Attends Banker Meetings: Not on file  . Marital Status: Not on file    Intimate Partner Violence:   . Fear of Current or Ex-Partner: Not on file  . Emotionally Abused: Not on file  . Physically Abused: Not on file  . Sexually Abused: Not on file    Allergies:  Allergies  Allergen Reactions  . Erythromycin Anaphylaxis, Swelling and Nausea And Vomiting    Medications: Prior to Admission medications   Medication Sig Start Date End Date Taking? Authorizing Provider  buPROPion (WELLBUTRIN XL) 150 MG 24 hr tablet Take 1 tablet (150 mg total) by mouth daily. 07/12/19  Yes Vena Austria, MD  hydrOXYzine (ATARAX/VISTARIL) 25 MG tablet Take 1 tablet (25 mg total) by mouth every 6 (six) hours as needed for anxiety. 07/12/19  Yes Vena Austria, MD  sulfamethoxazole-trimethoprim (BACTRIM DS) 800-160 MG tablet Take 1 tablet by mouth 2 (two) times daily. 07/14/19   Vena Austria, MD    Physical Exam Vitals:  Vitals:   07/14/19 1527  BP: 110/70   Patient's last menstrual period was 06/21/2019.  General: NAD, well nourished, appears stated age HEENT: normocephalic, anicteric Pulmonary: No increased work of breathing Abdomen: NABS, soft, non-tender, non-distended.  Umbilicus without lesions.  No hepatomegaly, splenomegaly or masses palpable. No evidence of hernia  Extremities: no edema, erythema, or tenderness.  Right groin with small 1cm boil, no fluctuance or drainage Neurologic: Grossly intact Psychiatric: mood appropriate, affect full  Female chaperone present for pelvic  portions of the physical exam  Assessment: 24 y.o. G3P3003 positive UPT in setting of prior BTL and boil right groin  Plan: Problem List Items Addressed This Visit    None    Visit Diagnoses    Irregular periods/menstrual cycles    -  Primary   Relevant Orders   POCT urine pregnancy (Completed)   Boil, thigh       Relevant Medications   sulfamethoxazole-trimethoprim (BACTRIM DS) 800-160 MG tablet     1) Small boil right groin crease -  rx bactrim.  Discussed warm  compresses and sitz baths  2) Positive UPT - subsequent home UPT negative with negative UPT here in office.  Patient reports irregular menses not heavy or bothersome declines OCP for cycle control   3) A total of 15 minutes were spent in face-to-face contact with the patient during this encounter with over half of that time devoted to counseling and coordination of care.  4) Return if symptoms worsen or fail to improve.   Vena Austria, MD, [facility], [facility]

## 2019-07-27 ENCOUNTER — Other Ambulatory Visit: Payer: Self-pay

## 2019-07-27 ENCOUNTER — Encounter: Payer: Self-pay | Admitting: Obstetrics and Gynecology

## 2019-07-27 ENCOUNTER — Ambulatory Visit (INDEPENDENT_AMBULATORY_CARE_PROVIDER_SITE_OTHER): Payer: Medicaid Other | Admitting: Obstetrics and Gynecology

## 2019-07-27 DIAGNOSIS — F329 Major depressive disorder, single episode, unspecified: Secondary | ICD-10-CM

## 2019-07-27 DIAGNOSIS — F419 Anxiety disorder, unspecified: Secondary | ICD-10-CM | POA: Diagnosis not present

## 2019-07-27 MED ORDER — VENLAFAXINE HCL ER 75 MG PO CP24: 75.0000 mg | ORAL_CAPSULE | Freq: Every day | ORAL | 3 refills | Status: DC

## 2019-07-27 NOTE — Progress Notes (Signed)
I connected with Vicki Adams on 07/27/19 at  9:50 AM EST by telephone and verified that I am speaking with the correct person using two identifiers.   I discussed the limitations, risks, security and privacy concerns of performing an evaluation and management service by telephone and the availability of in person appointments. I also discussed with the patient that there may be a patient responsible charge related to this service. The patient expressed understanding and agreed to proceed.  The patient was at home I spoke with the patient from my workstation phone The names of people involved in this encounter were: Vicki Adams , and [facility] Office Visit   Chief Complaint:  Chief Complaint  Patient presents with  . Follow-up    Anxiety/depression    History of Present Illness: The patient is a 24 y.o. female presenting follow up for symptoms of anxiety and depression.  The patient is currently taking Wellbutrin XL 150mg   for the management of her symptoms.  She has not had any recent situational stressors.  She reports symptoms of anhedonia, day time somnolence, insomnia, irritability, feelings of guilt, feelings of worthlessness and suicidal ideation.  She denies risk taking behavior, social anxiety, agorophobia, homicidal ideation, auditory hallucinations and visual hallucinations. Symptoms have worsened since last visit.   Reports two panic attack last night.    The patient does have a pre-existing history of depression and anxiety.  She  does not a prior history of suicide attempts.    Review of Systems: Review of Systems  Constitutional: Negative.   Gastrointestinal: Negative for nausea.  Neurological: Negative for headaches.  Psychiatric/Behavioral: Positive for depression and suicidal ideas. Negative for hallucinations, memory loss and substance abuse. The patient is nervous/anxious and has insomnia.      Past Medical  History:  Past Medical History:  Diagnosis Date  . Anemia   . Secundum ASD    a. 11/2000 s/p closure @ [facility]; b. 04/2015 Echo: Nl LV/RV fxn. Mild PR, triv TR. Stable ASD occluder; b. 01/2016 Echo: EF 60-65%, no rwma.  . Syncope and collapse    a. With second pregnancy-->02/2016 Holter: sinus rhythm, elevated HR likely 2/2 pregnancy. No arrhythmias.    Past Surgical History:  Past Surgical History:  Procedure Laterality Date  . CARDIAC SURGERY  11/2000  . COLONOSCOPY WITH PROPOFOL N/A 12/21/2018   Procedure: COLONOSCOPY WITH PROPOFOL;  Surgeon: Jonathon Bellows, MD;  Location: [facility] ENDOSCOPY;  Service: Gastroenterology;  Laterality: N/A;  . HERNIA REPAIR  07/03/1995   congential diaphragmatic hernia repair done at 60 weeks of age at [facility]  . TUBAL LIGATION Bilateral 05/09/2018   Procedure: POST PARTUM TUBAL LIGATION;  Surgeon: Malachy Mood, MD;  Location: [facility] ORS;  Service: Gynecology;  Laterality: Bilateral;    Gynecologic History: No LMP recorded.  Obstetric History: [id number]  Family History:  Family History  Problem Relation Age of Onset  . Migraines Mother   . Kidney disease Father   . Obesity Father   . Heart failure Father   . Scoliosis Sister   . Emphysema Maternal Grandmother   . Heart failure Maternal Grandmother     Social History:  Social History   Socioeconomic History  . Marital status: Single    Spouse name: Not on file  . Number of children: 2  . Years of education: 65  . Highest education level: Not on file  Occupational History  . 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 Smoking status: Never Smoker  . Smokeless tobacco: Never Used  Substance and Sexual Activity  . Alcohol use: Yes    Comment: occ  . Drug use: No    Comment: ACID  . Sexual activity: Yes    Birth control/protection: Other-see comments, Surgical    Comment: Tubal ligation  Other Topics Concern  . Not on file  Social  History Narrative  . Not on file   Social Determinants of Health   Financial Resource Strain:   . Difficulty of Paying Living Expenses: Not on file  Food Insecurity:   . Worried About Programme researcher, broadcasting/film/video in the Last Year: Not on file  . Ran Out of Food in the Last Year: Not on file  Transportation Needs:   . Lack of Transportation (Medical): Not on file  . Lack of Transportation (Non-Medical): Not on file  Physical Activity:   . Days of Exercise per Week: Not on file  . Minutes of Exercise per Session: Not on file  Stress:   . Feeling of Stress : Not on file  Social Connections:   . Frequency of Communication with Friends and Family: Not on file  . Frequency of Social Gatherings with Friends and Family: Not on file  . Attends Religious Services: Not on file  . Active Member of Clubs or Organizations: Not on file  . Attends Banker Meetings: Not on file  . Marital Status: Not on file  Intimate Partner Violence:   . Fear of Current or Ex-Partner: Not on file  . Emotionally Abused: Not on file  . Physically Abused: Not on file  . Sexually Abused: Not on file    Allergies:  Allergies  Allergen Reactions  . Erythromycin Anaphylaxis, Swelling and Nausea And Vomiting    Medications: Prior to Admission medications   Medication Sig Start Date End Date Taking? Authorizing Provider  buPROPion (WELLBUTRIN XL) 150 MG 24 hr tablet Take 1 tablet (150 mg total) by mouth daily. 07/12/19   Vena Austria, MD  hydrOXYzine (ATARAX/VISTARIL) 25 MG tablet Take 1 tablet (25 mg total) by mouth every 6 (six) hours as needed for anxiety. 07/12/19   Vena Austria, MD  sulfamethoxazole-trimethoprim (BACTRIM DS) 800-160 MG tablet Take 1 tablet by mouth 2 (two) times daily. 07/14/19   Vena Austria, MD    Physical Exam Vitals: There were no vitals filed for this visit. No LMP recorded.  No physical exam as this was a remote telephone visit to promote social distancing during  the current COVID-19 Pandemic   GAD 7 : Generalized Anxiety Score 07/27/2019 07/12/2019 01/30/2019  Nervous, Anxious, on Edge 3 3 1   Control/stop worrying 1 2 1   Worry too much - different things 2 3 1   Trouble relaxing 1 1 1   Restless 1 1 1   Easily annoyed or irritable 3 3 3   Afraid - awful might happen 2 1 1   Total GAD 7 Score 13 14 9   Anxiety Difficulty Somewhat difficult Very difficult Very difficult    Depression screen [facility] 2/9 07/27/2019 07/12/2019  Decreased Interest 3 2  Down, Depressed, Hopeless 2 1  PHQ - 2 Score 5 3  Altered sleeping 3 2  Tired, decreased energy 3 3  Change in appetite 0 0  Feeling bad or failure about yourself  2 0  Trouble concentrating 0 0  Moving slowly or fidgety/restless 0 0  Suicidal thoughts 1 0  PHQ-9 Score 14 8  Difficult doing work/chores Somewhat difficult Very difficult    Depression screen [facility] 2/9 07/27/2019 07/12/2019  Decreased Interest 3 2  Down, Depressed, Hopeless 2 1  PHQ - 2 Score 5 3  Altered sleeping 3 2  Tired, decreased energy 3 3  Change in appetite 0 0  Feeling bad or failure about yourself  2 0  Trouble concentrating 0 0  Moving slowly or fidgety/restless 0 0  Suicidal thoughts 1 0  PHQ-9 Score 14 8  Difficult doing work/chores Somewhat difficult Very difficult     Assessment: 24 y.o. [id number] follow up anxiety/depression Plan: Problem List Items Addressed This Visit    None    Visit Diagnoses    Anxiety and depression    -  Primary   Relevant Medications   venlafaxine XR (EFFEXOR-XR) 75 MG 24 hr capsule      1) Worsening of depression and anxiety symptoms with no improvement in fatigue of Wellbutrin - Switch to Effexor XR 75mg  - If no response to SNRI after failing Wellbutrin and two SSRI (Lexapro and Zoloft) would recommend psychiatry referral - some transient SI, no plan, no history and able to contract to for safety  2) Thyroid and B12 screen has not been obtained previously  3) Telephone time 8:21  minutes  4) Return in about 1 week (around 08/03/2019) for medication follow up phone.    10/01/2019, MD, Vena Austria [facility], [facility] 07/27/2019, 10:08 AM

## 2019-08-02 ENCOUNTER — Ambulatory Visit (INDEPENDENT_AMBULATORY_CARE_PROVIDER_SITE_OTHER): Payer: Medicaid Other | Admitting: Obstetrics and Gynecology

## 2019-08-02 ENCOUNTER — Encounter: Payer: Self-pay | Admitting: Obstetrics and Gynecology

## 2019-08-02 ENCOUNTER — Other Ambulatory Visit: Payer: Self-pay

## 2019-08-02 DIAGNOSIS — O99345 Other mental disorders complicating the puerperium: Secondary | ICD-10-CM | POA: Diagnosis not present

## 2019-08-02 DIAGNOSIS — F411 Generalized anxiety disorder: Secondary | ICD-10-CM | POA: Diagnosis not present

## 2019-08-02 DIAGNOSIS — F53 Postpartum depression: Secondary | ICD-10-CM

## 2019-08-02 NOTE — Progress Notes (Signed)
I connected with Vicki Adams on 08/02/19 at 11:30 AM EST by telephone and verified that I am speaking with the correct person using two identifiers.   I discussed the limitations, risks, security and privacy concerns of performing an evaluation and management service by telephone and the availability of in person appointments. I also discussed with the patient that there may be a patient responsible charge related to this service. The patient expressed understanding and agreed to proceed.  The patient was at home. I spoke with the patient from my workstation phone. The names of people involved in this encounter were: Vicki Adams , and [facility] Office Visit   Chief Complaint:  Chief Complaint  Patient presents with  . Follow-up    anxiety depression    History of Present Illness: The patient is a 24 y.o. female presenting follow up for symptoms of anxiety and depression.  The patient is currently taking Effexor XR 75mg  daily for the management of her symptoms.  She has not had any recent situational stressors.  She reports symptoms of insomnia, irritability and social anxiety.  She denies suicidal ideation, homicidal ideation, auditory hallucinations and visual hallucinations. Symptoms have improved since last visit.     The patient does have a pre-existing history of depression and anxiety.  She  does not a prior history of suicide attempts.  Previous treatment tied include Lexapro and Wellbutrin  Review of Systems: Review of Systems  Constitutional: Negative.   Gastrointestinal: Positive for nausea.  Neurological: Positive for headaches.  Psychiatric/Behavioral: Negative for depression, hallucinations, memory loss, substance abuse and suicidal ideas. The patient is nervous/anxious and has insomnia.      Past Medical History:  Past Medical History:  Diagnosis Date  . Anemia   . Secundum ASD    a. 11/2000 s/p closure @ [facility]; b.  04/2015 Echo: Nl LV/RV fxn. Mild PR, triv TR. Stable ASD occluder; b. 01/2016 Echo: EF 60-65%, no rwma.  . Syncope and collapse    a. With second pregnancy-->02/2016 Holter: sinus rhythm, elevated HR likely 2/2 pregnancy. No arrhythmias.    Past Surgical History:  Past Surgical History:  Procedure Laterality Date  . CARDIAC SURGERY  11/2000  . COLONOSCOPY WITH PROPOFOL N/A 12/21/2018   Procedure: COLONOSCOPY WITH PROPOFOL;  Surgeon: Jonathon Bellows, MD;  Location: [facility] ENDOSCOPY;  Service: Gastroenterology;  Laterality: N/A;  . HERNIA REPAIR  1996-03-26   congential diaphragmatic hernia repair done at 46 weeks of age at [facility]  . TUBAL LIGATION Bilateral 05/09/2018   Procedure: POST PARTUM TUBAL LIGATION;  Surgeon: Malachy Mood, MD;  Location: [facility] ORS;  Service: Gynecology;  Laterality: Bilateral;    Gynecologic History: Patient's last menstrual period was 07/27/2019.  Obstetric History: [id number]  Family History:  Family History  Problem Relation Age of Onset  . Migraines Mother   . Kidney disease Father   . Obesity Father   . Heart failure Father   . Scoliosis Sister   . Emphysema Maternal Grandmother   . Heart failure Maternal Grandmother     Social History:  Social History   Socioeconomic History  . Marital status: Single    Spouse name: Not on file  . Number of children: 2  . Years of education: 29  . Highest education level: Not on file  Occupational History  . 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 Smoking  status: Never Smoker  . Smokeless tobacco: Never Used  Substance and Sexual Activity  . Alcohol use: Yes    Comment: occ  . Drug use: No    Comment: ACID  . Sexual activity: Yes    Birth control/protection: Other-see comments, Surgical    Comment: Tubal ligation  Other Topics Concern  . Not on file  Social History Narrative  . Not on file   Social Determinants of Health   Financial  Resource Strain:   . Difficulty of Paying Living Expenses: Not on file  Food Insecurity:   . Worried About Programme researcher, broadcasting/film/video in the Last Year: Not on file  . Ran Out of Food in the Last Year: Not on file  Transportation Needs:   . Lack of Transportation (Medical): Not on file  . Lack of Transportation (Non-Medical): Not on file  Physical Activity:   . Days of Exercise per Week: Not on file  . Minutes of Exercise per Session: Not on file  Stress:   . Feeling of Stress : Not on file  Social Connections:   . Frequency of Communication with Friends and Family: Not on file  . Frequency of Social Gatherings with Friends and Family: Not on file  . Attends Religious Services: Not on file  . Active Member of Clubs or Organizations: Not on file  . Attends Banker Meetings: Not on file  . Marital Status: Not on file  Intimate Partner Violence:   . Fear of Current or Ex-Partner: Not on file  . Emotionally Abused: Not on file  . Physically Abused: Not on file  . Sexually Abused: Not on file    Allergies:  Allergies  Allergen Reactions  . Erythromycin Anaphylaxis, Swelling and Nausea And Vomiting    Medications: Prior to Admission medications   Medication Sig Start Date End Date Taking? Authorizing Provider  venlafaxine XR (EFFEXOR-XR) 75 MG 24 hr capsule Take 1 capsule (75 mg total) by mouth daily. 07/27/19  Yes Vena Austria, MD  hydrOXYzine (ATARAX/VISTARIL) 25 MG tablet Take 1 tablet (25 mg total) by mouth every 6 (six) hours as needed for anxiety. Patient not taking: Reported on 08/02/2019 07/12/19   Vena Austria, MD    Physical Exam Vitals: There were no vitals filed for this visit. Patient's last menstrual period was 07/27/2019.  No physical exam as this was a remote telephone visit to promote social distancing during the current COVID-19 Pandemic   GAD 7 : Generalized Anxiety Score 08/02/2019 07/27/2019 07/12/2019 01/30/2019  Nervous, Anxious, on Edge 2 3 3 1    Control/stop worrying 2 1 2 1   Worry too much - different things 2 2 3 1   Trouble relaxing 1 1 1 1   Restless 0 1 1 1   Easily annoyed or irritable 3 3 3 3   Afraid - awful might happen 1 2 1 1   Total GAD 7 Score 11 13 14 9   Anxiety Difficulty - Somewhat difficult Very difficult Very difficult    Depression screen [facility] 2/9 08/02/2019 07/27/2019 07/12/2019  Decreased Interest 1 3 2   Down, Depressed, Hopeless 2 2 1   PHQ - 2 Score 3 5 3   Altered sleeping 3 3 2   Tired, decreased energy 1 3 3   Change in appetite 3 0 0  Feeling bad or failure about yourself  - 2 0  Trouble concentrating 1 0 0  Moving slowly or fidgety/restless 0 0 0  Suicidal thoughts 0 1 0  PHQ-9 Score 11 14 8  Difficult doing work/chores - Somewhat difficult Very difficult    Depression screen [facility] 2/9 08/02/2019 07/27/2019 07/12/2019  Decreased Interest 1 3 2   Down, Depressed, Hopeless 2 2 1   PHQ - 2 Score 3 5 3   Altered sleeping 3 3 2   Tired, decreased energy 1 3 3   Change in appetite 3 0 0  Feeling bad or failure about yourself  - 2 0  Trouble concentrating 1 0 0  Moving slowly or fidgety/restless 0 0 0  Suicidal thoughts 0 1 0  PHQ-9 Score 11 14 8   Difficult doing work/chores - Somewhat difficult Very difficult     Assessment: 24 y.o. postpartum depression/anxiety  Plan: Problem List Items Addressed This Visit    None    Visit Diagnoses    Generalized anxiety disorder    -  Primary   Postpartum depression          1) Depression/anxiety - improvement in ovarall mood on Effexor, she has also noted significantly less irritability as well as improved energy.  Only side-effects are some nausea, had one headache, and noted some increased photosensitivity with night time driving. - re-evaluate in 3 week to see if dose increase is warranted  2) Thyroid and B12 screen has not been obtained previously  3) Telephone Time 6:53 minutes  4) Return in about 3 weeks (around 08/23/2019) for medication follow up  (phone, video, or telephone).   , MD, [facility], [facility] 08/02/2019, 12:08 PM

## 2019-08-19 ENCOUNTER — Other Ambulatory Visit: Payer: Self-pay | Admitting: Obstetrics and Gynecology

## 2019-08-24 ENCOUNTER — Other Ambulatory Visit: Payer: Self-pay

## 2019-08-24 ENCOUNTER — Encounter: Payer: Self-pay | Admitting: Obstetrics and Gynecology

## 2019-08-24 ENCOUNTER — Ambulatory Visit (INDEPENDENT_AMBULATORY_CARE_PROVIDER_SITE_OTHER): Payer: Medicaid Other | Admitting: Obstetrics and Gynecology

## 2019-08-24 DIAGNOSIS — F418 Other specified anxiety disorders: Secondary | ICD-10-CM

## 2019-08-24 MED ORDER — VENLAFAXINE HCL ER 75 MG PO CP24: ORAL_CAPSULE | ORAL | 4 refills | Status: DC

## 2019-08-24 NOTE — Progress Notes (Signed)
I connected with Molli Posey on 08/24/19 at  9:50 AM EST by telephone and verified that I am speaking with the correct person using two identifiers.   I discussed the limitations, risks, security and privacy concerns of performing an evaluation and management service by telephone and the availability of in person appointments. I also discussed with the patient that there may be a patient responsible charge related to this service. The patient expressed understanding and agreed to proceed.  The patient was at home I spoke with the patient from my workstation phone The names of people involved in this encounter were: Molli Posey , and Vena Austria   Obstetrics & Gynecology Office Visit   Chief Complaint:  Chief Complaint  Patient presents with  . Follow-up    History of Present Illness: The patient is a 24 y.o. female presenting follow up for symptoms of anxiety and depression.  The patient is currently taking Effexor CR 75mg   for the management of her symptoms.  She has not had any recent situational stressors.  She reports symptoms of good control of symptoms on her current regimen. She denies risk taking behavior, suicidal ideation, homicidal ideation, auditory hallucinations and visual hallucinations. Symptoms have improved since last visit.   Side-effect profile likewise improved.    The patient does not have a pre-existing history of depression and anxiety.  She  does not a prior history of suicide attempts.   Review of Systems: Review of Systems  Constitutional: Negative.   Gastrointestinal: Negative for nausea.  Neurological: Negative for headaches.  Psychiatric/Behavioral: Negative.    Past Medical History:  Past Medical History:  Diagnosis Date  . Anemia   . Secundum ASD    a. 11/2000 s/p closure @ [facility]; b. 04/2015 Echo: Nl LV/RV fxn. Mild PR, triv TR. Stable ASD occluder; b. 01/2016 Echo: EF 60-65%, no rwma.  . Syncope and collapse    a. With  second pregnancy-->02/2016 Holter: sinus rhythm, elevated HR likely 2/2 pregnancy. No arrhythmias.    Past Surgical History:  Past Surgical History:  Procedure Laterality Date  . CARDIAC SURGERY  11/2000  . COLONOSCOPY WITH PROPOFOL N/A 12/21/2018   Procedure: COLONOSCOPY WITH PROPOFOL;  Surgeon: 02/21/2019, MD;  Location: [facility] ENDOSCOPY;  Service: Gastroenterology;  Laterality: N/A;  . HERNIA REPAIR  02-26-1996   congential diaphragmatic hernia repair done at 60 weeks of age at [facility]  . TUBAL LIGATION Bilateral 05/09/2018   Procedure: POST PARTUM TUBAL LIGATION;  Surgeon: 05/11/2018, MD;  Location: [facility] ORS;  Service: Gynecology;  Laterality: Bilateral;    Gynecologic History: Patient's last menstrual period was 07/27/2019.  Obstetric History: 09/24/2019  Family History:  Family History  Problem Relation Age of Onset  . Migraines Mother   . Kidney disease Father   . Obesity Father   . Heart failure Father   . Scoliosis Sister   . Emphysema Maternal Grandmother   . Heart failure Maternal Grandmother     Social History:  Social History   Socioeconomic History  . Marital status: Single    Spouse name: Not on file  . Number of children: 2  . Years of education: 55  . Highest education level: Not on file  Occupational History  . 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 Smoking status: Never Smoker  . Smokeless tobacco: Never Used  Substance and Sexual Activity  . Alcohol use: Yes  Comment: occ  . Drug use: No    Comment: ACID  . Sexual activity: Yes    Birth control/protection: Other-see comments, Surgical    Comment: Tubal ligation  Other Topics Concern  . Not on file  Social History Narrative  . Not on file   Social Determinants of Health   Financial Resource Strain:   . Difficulty of Paying Living Expenses: Not on file  Food Insecurity:   . Worried About Charity fundraiser in the Last  Year: Not on file  . Ran Out of Food in the Last Year: Not on file  Transportation Needs:   . Lack of Transportation (Medical): Not on file  . Lack of Transportation (Non-Medical): Not on file  Physical Activity:   . Days of Exercise per Week: Not on file  . Minutes of Exercise per Session: Not on file  Stress:   . Feeling of Stress : Not on file  Social Connections:   . Frequency of Communication with Friends and Family: Not on file  . Frequency of Social Gatherings with Friends and Family: Not on file  . Attends Religious Services: Not on file  . Active Member of Clubs or Organizations: Not on file  . Attends Archivist Meetings: Not on file  . Marital Status: Not on file  Intimate Partner Violence:   . Fear of Current or Ex-Partner: Not on file  . Emotionally Abused: Not on file  . Physically Abused: Not on file  . Sexually Abused: Not on file    Allergies:  Allergies  Allergen Reactions  . Erythromycin Anaphylaxis, Swelling and Nausea And Vomiting    Medications: Prior to Admission medications   Medication Sig Start Date End Date Taking? Authorizing Provider  hydrOXYzine (ATARAX/VISTARIL) 25 MG tablet Take 1 tablet (25 mg total) by mouth every 6 (six) hours as needed for anxiety. Patient not taking: Reported on 08/02/2019 07/12/19   Malachy Mood, MD  venlafaxine XR (EFFEXOR-XR) 75 MG 24 hr capsule TAKE 1 CAPSULE BY MOUTH EVERY DAY 08/24/19   Malachy Mood, MD    Physical Exam Vitals: There were no vitals filed for this visit. Patient's last menstrual period was 07/27/2019.  No physical exam as this was a remote telephone visit to promote social distancing during the current COVID-19 Pandemic   GAD 7 : Generalized Anxiety Score 08/24/2019 08/02/2019 07/27/2019 07/12/2019  Nervous, Anxious, on Edge 1 2 3 3   Control/stop worrying 1 2 1 2   Worry too much - different things 1 2 2 3   Trouble relaxing 0 1 1 1   Restless 0 0 1 1  Easily annoyed or irritable 3 3  3 3   Afraid - awful might happen 1 1 2 1   Total GAD 7 Score 7 11 13 14   Anxiety Difficulty Somewhat difficult - Somewhat difficult Very difficult    Depression screen [facility] 2/9 08/24/2019 08/02/2019 07/27/2019  Decreased Interest 1 1 3   Down, Depressed, Hopeless 1 2 2   PHQ - 2 Score 2 3 5   Altered sleeping 0 3 3  Tired, decreased energy 1 1 3   Change in appetite 0 3 0  Feeling bad or failure about yourself  1 - 2  Trouble concentrating 0 1 0  Moving slowly or fidgety/restless 0 0 0  Suicidal thoughts 0 0 1  PHQ-9 Score 4 11 14   Difficult doing work/chores - - Somewhat difficult    Depression screen [facility] 2/9 08/24/2019 08/02/2019 07/27/2019 07/12/2019  Decreased Interest 1 1 3  2  Down, Depressed, Hopeless 1 2 2 1   PHQ - 2 Score 2 3 5 3   Altered sleeping 0 3 3 2   Tired, decreased energy 1 1 3 3   Change in appetite 0 3 0 0  Feeling bad or failure about yourself  1 - 2 0  Trouble concentrating 0 1 0 0  Moving slowly or fidgety/restless 0 0 0 0  Suicidal thoughts 0 0 1 0  PHQ-9 Score 4 11 14 8   Difficult doing work/chores - - Somewhat difficult Very difficult     Assessment: 24 y.o. follow up anxiety/depression  Plan: Problem List Items Addressed This Visit    None    Visit Diagnoses    Depression with anxiety    -  Primary   Relevant Medications   venlafaxine XR (EFFEXOR-XR) 75 MG 24 hr capsule     1) Effexor XR 75mg  occasional vasomotor symptoms only side-effect.   Nausea and headaches improved.  Mood good improvement.    - maintain at current dose  2) Thyroid and B12 screen has not been obtained previously  3) Telephone time 6:38 minutes  4) Return in about 2 months (around 10/24/2019) for medication follow up.    , MD, [facility], [facility] 08/24/2019, 10:14 AM

## 2019-09-13 ENCOUNTER — Emergency Department (HOSPITAL_COMMUNITY)
Admission: EM | Admit: 2019-09-13 | Discharge: 2019-09-13 | Disposition: A | Discharge status: Home / Self Care | Payer: Medicaid Other | Attending: Emergency Medicine | Admitting: Emergency Medicine

## 2019-09-13 ENCOUNTER — Encounter (HOSPITAL_COMMUNITY): Payer: Self-pay | Admitting: Emergency Medicine

## 2019-09-13 ENCOUNTER — Other Ambulatory Visit: Payer: Self-pay

## 2019-09-13 DIAGNOSIS — Z5321 Procedure and treatment not carried out due to patient leaving prior to being seen by health care provider: Secondary | ICD-10-CM | POA: Insufficient documentation

## 2019-09-13 DIAGNOSIS — M79604 Pain in right leg: Secondary | ICD-10-CM | POA: Insufficient documentation

## 2019-09-13 DIAGNOSIS — M545 Low back pain: Secondary | ICD-10-CM | POA: Insufficient documentation

## 2019-09-13 NOTE — ED Triage Notes (Signed)
Patient c/o lower back pain that started this evening. Patient describes nerve pain that shot up her back and back down her right leg.

## 2019-09-13 NOTE — ED Notes (Signed)
Pt walked out of facility without signing out.

## 2019-09-13 NOTE — ED Notes (Signed)
Pt called me into room to ask for a work note so she could leave. I informed her the EDP would have to give her a work note.

## 2019-10-25 ENCOUNTER — Ambulatory Visit (INDEPENDENT_AMBULATORY_CARE_PROVIDER_SITE_OTHER): Payer: Medicaid Other | Admitting: Obstetrics and Gynecology

## 2019-10-25 ENCOUNTER — Encounter: Payer: Self-pay | Admitting: Obstetrics and Gynecology

## 2019-10-25 ENCOUNTER — Other Ambulatory Visit: Payer: Self-pay

## 2019-10-25 DIAGNOSIS — F410 Panic disorder [episodic paroxysmal anxiety] without agoraphobia: Secondary | ICD-10-CM | POA: Diagnosis not present

## 2019-10-25 DIAGNOSIS — F411 Generalized anxiety disorder: Secondary | ICD-10-CM | POA: Diagnosis not present

## 2019-10-25 HISTORY — DX: Panic disorder (episodic paroxysmal anxiety): F41.0

## 2019-10-25 MED ORDER — DULOXETINE HCL 20 MG PO CPEP: 20.0000 mg | ORAL_CAPSULE | Freq: Every day | ORAL | 3 refills | Status: DC
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
## 2019-11-08 ENCOUNTER — Other Ambulatory Visit: Payer: Self-pay | Admitting: Obstetrics and Gynecology

## 2019-11-08 NOTE — Telephone Encounter (Signed)
Advise if 90 day supply can be sent in °

## 2019-11-22 ENCOUNTER — Other Ambulatory Visit: Payer: Self-pay

## 2019-11-22 ENCOUNTER — Ambulatory Visit: Payer: Medicaid Other | Admitting: Obstetrics and Gynecology

## 2020-01-29 ENCOUNTER — Other Ambulatory Visit: Payer: Self-pay

## 2020-01-29 ENCOUNTER — Ambulatory Visit (INDEPENDENT_AMBULATORY_CARE_PROVIDER_SITE_OTHER): Payer: Medicaid Other | Admitting: Obstetrics & Gynecology

## 2020-01-29 ENCOUNTER — Encounter: Payer: Self-pay | Admitting: Obstetrics & Gynecology

## 2020-01-29 VITALS — BP 120/80 | Ht 67.0 in | Wt 207.0 lb

## 2020-01-29 DIAGNOSIS — N926 Irregular menstruation, unspecified: Secondary | ICD-10-CM

## 2020-01-29 DIAGNOSIS — R102 Pelvic and perineal pain unspecified side: Secondary | ICD-10-CM

## 2020-01-29 DIAGNOSIS — Z3201 Encounter for pregnancy test, result positive: Secondary | ICD-10-CM

## 2020-01-29 LAB — POCT URINE PREGNANCY: Preg Test, Ur: NEGATIVE

## 2020-01-29 NOTE — Progress Notes (Signed)
  Missed Menses/ Possible Pregnancy Patient complains of missing a period this month and a faintly positive uCG at home. She believes she could be pregnant. Pregnancy is not desired. Sexual Activity: single partner, contraception: BTL 04/2018 (confirmed surgery and path reports for PP BTL with partial salpingectomy). Current symptoms also include: breast tenderness, fatigue and RLQ shooting pain that is mild and intermittent. Last period was normal.   Patient's last menstrual period was 12/21/2018.   PMHx: She  has a past medical history of Anemia, Generalized anxiety disorder with panic attacks (10/25/2019), Secundum ASD, and Syncope and collapse. Also,  has a past surgical history that includes Cardiac surgery (11/2000); Hernia repair (02/21/96); Tubal ligation (Bilateral, 05/09/2018); and Colonoscopy with propofol (N/A, 12/21/2018)., family history includes Emphysema in her maternal grandmother; Heart failure in her father and maternal grandmother; Kidney disease in her father; Migraines in her mother; Obesity in her father; Scoliosis in her sister.,  reports that she has never smoked. She has never used smokeless tobacco. She reports current alcohol use. She reports current drug use. Frequency: 2.00 times per week. Drug: Marijuana.  She currently has no medications in their medication list. Also, is allergic to erythromycin.  Review of Systems  Constitutional: Negative for chills, fever and malaise/fatigue.  HENT: Negative for congestion, sinus pain and sore throat.   Eyes: Negative for blurred vision and pain.  Respiratory: Negative for cough and wheezing.   Cardiovascular: Negative for chest pain and leg swelling.  Gastrointestinal: Negative for abdominal pain, constipation, diarrhea, heartburn, nausea and vomiting.  Genitourinary: Negative for dysuria, frequency, hematuria and urgency.  Musculoskeletal: Negative for back pain, joint pain, myalgias and neck pain.  Skin: Negative for itching and  rash.  Neurological: Negative for dizziness, tremors and weakness.  Endo/Heme/Allergies: Does not bruise/bleed easily.  Psychiatric/Behavioral: Negative for depression. The patient is not nervous/anxious and does not have insomnia.     Objective: BP 120/80   Ht 5\' 7"  (1.702 m)   Wt 207 lb (93.9 kg)   LMP 12/21/2018   BMI 32.42 kg/m  Physical Exam Constitutional:      General: She is not in acute distress.    Appearance: She is well-developed.  Musculoskeletal:        General: Normal range of motion.  Neurological:     Mental Status: She is alert and oriented to person, place, and time.  Skin:    General: Skin is warm and dry.  Vitals reviewed.   uCG NEG today here  ASSESSMENT/PLAN:    Problem List Items Addressed This Visit    Positive pregnancy test    -  Primary   Relevant Orders   POCT urine pregnancy (Completed)   Missed period       Relevant Orders   Beta hCG quant (ref lab)   Pelvic pain        Recheck in 3-4 weeks Also needs PAP/Annual.  Trying to get [employer] in network coverage If pain worsens, then exam and 02/21/2019 sooner. Discussed etiology for missed period with neg testing of pregnancy  [place], MD, [facility], [facility] 01/29/2020  8:54 AM

## 2020-01-30 ENCOUNTER — Telehealth: Payer: Self-pay | Admitting: Obstetrics & Gynecology

## 2020-01-30 LAB — BETA HCG QUANT (REF LAB): hCG Quant: <1 m[IU]/mL

## 2020-01-30 NOTE — Telephone Encounter (Signed)
Cancel appts 02/05/2020, not needed then.

## 2020-01-30 NOTE — Telephone Encounter (Signed)
Patient is calling to follow up on results. Please advise

## 2020-01-30 NOTE — Telephone Encounter (Signed)
Appointment has been cancelled.

## 2020-02-05 ENCOUNTER — Ambulatory Visit: Payer: Medicaid Other | Admitting: Obstetrics

## 2020-02-21 ENCOUNTER — Ambulatory Visit
Admission: EM | Admit: 2020-02-21 | Discharge: 2020-02-21 | Disposition: A | Discharge status: Home / Self Care | Payer: Medicaid Other | Attending: Emergency Medicine | Admitting: Emergency Medicine

## 2020-02-21 ENCOUNTER — Other Ambulatory Visit: Payer: Self-pay

## 2020-02-21 DIAGNOSIS — S99922A Unspecified injury of left foot, initial encounter: Secondary | ICD-10-CM

## 2020-02-21 DIAGNOSIS — S91332A Puncture wound without foreign body, left foot, initial encounter: Secondary | ICD-10-CM | POA: Diagnosis not present

## 2020-02-21 DIAGNOSIS — W450XXA Nail entering through skin, initial encounter: Secondary | ICD-10-CM | POA: Diagnosis not present

## 2020-02-21 DIAGNOSIS — Z23 Encounter for immunization: Secondary | ICD-10-CM | POA: Diagnosis not present

## 2020-02-21 MED ORDER — TETANUS-DIPHTH-ACELL PERTUSSIS 5-2.5-18.5 LF-MCG/0.5 IM SUSP
0.5000 mL | Freq: Once | INTRAMUSCULAR | Status: AC
  Administered 2020-02-21: 0.5 mL via INTRAMUSCULAR

## 2020-02-21 MED ORDER — CEPHALEXIN 500 MG PO CAPS: 500.0000 mg | ORAL_CAPSULE | Freq: Four times a day (QID) | ORAL | 0 refills | Status: AC

## 2020-02-21 NOTE — Discharge Instructions (Addendum)
Antibiotics prescribed/take as directed Clean wound with warm water and mild soap Apply thin layer of Neosporin May use OTC Tylenol/ibuprofen as needed for pain Follow-up with PCP Return or go to ED for worsening of symptoms

## 2020-02-21 NOTE — ED Triage Notes (Signed)
Pt presents with puncture wound to left foot after stepping on nail yesterday , may need tetanus

## 2020-02-21 NOTE — ED Provider Notes (Signed)
[facility] CARE CENTER   [id number] 02/21/20 Arrival Time: 1402  Chief Complaint  Patient presents with  . Foot Injury     SUBJECTIVE: History from: patient.  Vicki Adams is a 24 y.o. female who presented to the urgent care with a complaint of left foot injury after stepping on a nail yesterday.  States the nail has been removed and was intact.  She localizes the pain to the left foot.  She describes the pain as constant and achy.  She has tried OTC medications without relief.  Her symptoms are made worse with ROM.  She denies similar symptoms in the past.  Tetanus immunization status is unknown.  Denies chills, fever, nausea, vomiting, diarrhea.   ROS: As per HPI.  All other pertinent ROS negative.     Past Medical History:  Diagnosis Date  . Anemia   . 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 Secundum ASD    a. 11/2000 s/p closure @ [facility]; b. 04/2015 Echo: Nl LV/RV fxn. Mild PR, triv TR. Stable ASD occluder; b. 01/2016 Echo: EF 60-65%, no rwma.  . Syncope and collapse    a. With second pregnancy-->02/2016 Holter: sinus rhythm, elevated HR likely 2/2 pregnancy. No arrhythmias.   Past Surgical History:  Procedure Laterality Date  . CARDIAC SURGERY  11/2000  . COLONOSCOPY WITH PROPOFOL N/A 12/21/2018   Procedure: COLONOSCOPY WITH PROPOFOL;  Surgeon: [name], MD;  Location: [facility] ENDOSCOPY;  Service: Gastroenterology;  Laterality: N/A;  . HERNIA REPAIR  1996-06-11   congential diaphragmatic hernia repair done at 7 weeks of age at [facility]  . TUBAL LIGATION Bilateral 05/09/2018   Procedure: POST PARTUM TUBAL LIGATION;  Surgeon: Vena Austria, MD;  Location: [facility] ORS;  Service: Gynecology;  Laterality: Bilateral;   Allergies  Allergen Reactions  . Erythromycin Anaphylaxis, Swelling and Nausea And Vomiting   No current facility-administered medications on file prior to encounter.   No current outpatient medications on file prior to encounter.   Social  History   Socioeconomic History  . Marital status: Single    Spouse name: Not on file  . Number of children: 2  . Years of education: 80  . Highest education level: Not on file  Occupational History  . 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 Smoking status: Never Smoker  . Smokeless tobacco: Never Used  Vaping Use  . Vaping Use: Never used  Substance and Sexual Activity  . Alcohol use: Yes    Comment: occ  . Drug use: Yes    Frequency: 2.0 times per week    Types: Marijuana    Comment: ACID  . Sexual activity: Yes    Birth control/protection: Other-see comments, Surgical    Comment: Tubal ligation  Other Topics Concern  . Not on file  Social History Narrative  . Not on file   Social Determinants of Health   Financial Resource Strain:   . Difficulty of Paying Living Expenses: Not on file  Food Insecurity:   . Worried About Programme researcher, broadcasting/film/video in the Last Year: Not on file  . Ran Out of Food in the Last Year: Not on file  Transportation Needs:   . Lack of Transportation (Medical): Not on file  . Lack of Transportation (Non-Medical): Not on file  Physical Activity:   . Days of Exercise per Week: Not on file  . Minutes of Exercise per Session:  Not on file  Stress:   . Feeling of Stress : Not on file  Social Connections:   . Frequency of Communication with Friends and Family: Not on file  . Frequency of Social Gatherings with Friends and Family: Not on file  . Attends Religious Services: Not on file  . Active Member of Clubs or Organizations: Not on file  . Attends Banker Meetings: Not on file  . Marital Status: Not on file  Intimate Partner Violence:   . Fear of Current or Ex-Partner: Not on file  . Emotionally Abused: Not on file  . Physically Abused: Not on file  . Sexually Abused: Not on file   Family History  Problem Relation Age of Onset  . Migraines Mother   . Kidney  disease Father   . Obesity Father   . Heart failure Father   . Scoliosis Sister   . Emphysema Maternal Grandmother   . Heart failure Maternal Grandmother     OBJECTIVE:  Vitals:   02/21/20 1500  BP: 114/73  Pulse: 67  Resp: 16  Temp: 98.2 F (36.8 C)  TempSrc: Oral  SpO2: 97%     Physical Exam Vitals and nursing note reviewed.  Constitutional:      General: She is not in acute distress.    Appearance: Normal appearance. She is normal weight. She is not ill-appearing, toxic-appearing or diaphoretic.  HENT:     Head: Normocephalic.  Cardiovascular:     Rate and Rhythm: Normal rate and regular rhythm.     Pulses: Normal pulses.     Heart sounds: Normal heart sounds. No murmur heard.  No friction rub. No gallop.   Pulmonary:     Effort: Pulmonary effort is normal. No respiratory distress.     Breath sounds: Normal breath sounds. No stridor. No wheezing, rhonchi or rales.  Chest:     Chest wall: No tenderness.  Musculoskeletal:        General: Tenderness and signs of injury present.     Right foot: Normal.     Left foot: Swelling and tenderness present.     Comments: The left foot is with obvious deformity compared to the right foot.  Swelling present on the sole of foot.  No ecchymosis, lesion.  Limited range of motion due to pain.  Neurovascular status intact  Skin:    Findings: Erythema present.     Comments: Puncture wound present on sole of left foot  Neurological:     Mental Status: She is alert and oriented to person, place, and time.     LABS:  No results found for this or any previous visit (from the past 24 hour(s)).   ASSESSMENT & PLAN:  1. Injury of left foot, initial encounter   2. Nail wound of left foot, initial encounter     Meds ordered this encounter  Medications  . Tdap (BOOSTRIX) injection 0.5 mL  . cephALEXin (KEFLEX) 500 MG capsule    Sig: Take 1 capsule (500 mg total) by mouth 4 (four) times daily for 7 days.    Dispense:  28 capsule     Refill:  0    Discharge instructions Antibiotics prescribed/take as directed Clean wound with warm water and mild soap Apply thin layer of Neosporin May use OTC Tylenol/ibuprofen as needed for pain Follow-up with PCP Return or go to ED for worsening of symptoms  Reviewed expectations re: course of current medical issues. Questions answered. Outlined signs and symptoms indicating need  for more acute intervention. Patient verbalized understanding. After Visit Summary given.         Durward Parcel, FNP 02/21/20 1538

## 2020-07-28 ENCOUNTER — Emergency Department (HOSPITAL_COMMUNITY)
Admission: EM | Admit: 2020-07-28 | Discharge: 2020-07-28 | Discharge status: Against Medical Advice | Payer: Medicaid Other | Attending: Emergency Medicine | Admitting: Emergency Medicine

## 2020-07-28 ENCOUNTER — Emergency Department (HOSPITAL_COMMUNITY): Payer: Medicaid Other

## 2020-07-28 ENCOUNTER — Other Ambulatory Visit: Payer: Self-pay

## 2020-07-28 DIAGNOSIS — M549 Dorsalgia, unspecified: Secondary | ICD-10-CM | POA: Insufficient documentation

## 2020-07-28 DIAGNOSIS — R0609 Other forms of dyspnea: Secondary | ICD-10-CM | POA: Insufficient documentation

## 2020-07-28 DIAGNOSIS — R079 Chest pain, unspecified: Secondary | ICD-10-CM | POA: Diagnosis present

## 2020-07-28 DIAGNOSIS — U071 COVID-19: Secondary | ICD-10-CM | POA: Diagnosis not present

## 2020-07-28 DIAGNOSIS — Z20822 Contact with and (suspected) exposure to covid-19: Secondary | ICD-10-CM | POA: Insufficient documentation

## 2020-07-28 DIAGNOSIS — R42 Dizziness and giddiness: Secondary | ICD-10-CM | POA: Diagnosis not present

## 2020-07-28 DIAGNOSIS — Z5321 Procedure and treatment not carried out due to patient leaving prior to being seen by health care provider: Secondary | ICD-10-CM | POA: Diagnosis not present

## 2020-07-28 LAB — TROPONIN I (HIGH SENSITIVITY)
Troponin I (High Sensitivity): <2 ng/L (ref <18)
Troponin I (High Sensitivity): <2 ng/L (ref <18)

## 2020-07-28 LAB — BASIC METABOLIC PANEL
Anion gap: 12 (ref 5–15)
BUN: 11 mg/dL (ref 6–20)
CO2: 22 mmol/L (ref 22–32)
Calcium: 9.3 mg/dL (ref 8.9–10.3)
Chloride: 99 mmol/L (ref 98–111)
Creatinine, Ser: 1.02 mg/dL — ABNORMAL HIGH (ref 0.44–1.00)
GFR, Estimated: >60 mL/min (ref >60)
Glucose, Bld: 109 mg/dL — ABNORMAL HIGH (ref 70–99)
Potassium: 3.6 mmol/L (ref 3.5–5.1)
Sodium: 133 mmol/L — ABNORMAL LOW (ref 135–145)

## 2020-07-28 LAB — CBC
HCT: 34.3 % — ABNORMAL LOW (ref 36.0–46.0)
Hemoglobin: 11 g/dL — ABNORMAL LOW (ref 12.0–15.0)
MCH: 26.1 pg (ref 26.0–34.0)
MCHC: 32.1 g/dL (ref 30.0–36.0)
MCV: 81.3 fL (ref 80.0–100.0)
Platelets: 148 10*3/uL — ABNORMAL LOW (ref 150–400)
RBC: 4.22 MIL/uL (ref 3.87–5.11)
RDW: 14.5 % (ref 11.5–15.5)
WBC: 5.2 10*3/uL (ref 4.0–10.5)
nRBC: 0 % (ref 0.0–0.2)

## 2020-07-28 LAB — I-STAT BETA HCG BLOOD, ED (MC, WL, AP ONLY): I-stat hCG, quantitative: <5 m[IU]/mL (ref <5)

## 2020-07-28 LAB — SARS CORONAVIRUS 2 BY RT PCR (HOSPITAL ORDER, PERFORMED IN ~~LOC~~ HOSPITAL LAB): SARS Coronavirus 2: POSITIVE — AB

## 2020-07-28 MED ORDER — ACETAMINOPHEN 325 MG PO TABS
650.0000 mg | ORAL_TABLET | Freq: Once | ORAL | Status: AC
  Administered 2020-07-28: 650 mg via ORAL
  Filled 2020-07-28: qty 2

## 2020-07-28 MED ORDER — ONDANSETRON 4 MG PO TBDP
4.0000 mg | ORAL_TABLET | Freq: Once | ORAL | Status: AC
  Administered 2020-07-28: 4 mg via ORAL
  Filled 2020-07-28: qty 1

## 2020-07-28 NOTE — ED Triage Notes (Signed)
Pt presents to ED POV. Pt c/o center CP. Pt reports that pain radiates towards back. Pt also c/o dizziness, SOB on exertion. Pt reports that she has hx of "hole" in heart. Pt is unvaccinated for covid, denies any known exposures

## 2020-07-28 NOTE — ED Notes (Signed)
Pt left AMA, advised to return if symptoms worsen. 

## 2020-07-29 ENCOUNTER — Ambulatory Visit
Admission: EM | Admit: 2020-07-29 | Discharge: 2020-07-29 | Disposition: A | Discharge status: Home / Self Care | Payer: Medicaid Other | Attending: Emergency Medicine | Admitting: Emergency Medicine

## 2020-07-29 DIAGNOSIS — U071 COVID-19: Secondary | ICD-10-CM | POA: Diagnosis not present

## 2020-07-29 DIAGNOSIS — F419 Anxiety disorder, unspecified: Secondary | ICD-10-CM

## 2020-07-29 MED ORDER — HYDROXYZINE HCL 25 MG PO TABS: 25.0000 mg | ORAL_TABLET | Freq: Four times a day (QID) | ORAL | 0 refills | Status: DC | PRN

## 2020-07-29 NOTE — ED Provider Notes (Signed)
[facility] CARE CENTER   [id number] 07/29/20 Arrival Time: 1049   Chief Complaint  Patient presents with  . Anxiety     SUBJECTIVE: History from: patient and family.  Vicki Adams is a 25 y.o. female who presented to the urgent care with a complaint of anxiety and panic attack episode for the past few days.  Was seen at the ER yesterday for chest pain and tested positive for COVID-19.  Denies a precipitating event, or recent stresor.  Describes anxiety as as chest pain.  Currently not taking any medication, but was taking hydroxyzine.  Symptoms are made worse with family stress.  Reports similar symptoms in the past that improved with hydroxyzine.  Denies HI or SI.  Patient denies fever, chills, anhedonia, difficulty sleeping, changes in normal activities, nausea, vomiting, chest pain, SOB, abdominal pain, changes in bowel or bladder habits.    ROS: As per HPI.  All other pertinent ROS negative.      Past Medical History:  Diagnosis Date  . Anemia   . 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 Secundum ASD    a. 11/2000 s/p closure @ [facility]; b. 04/2015 Echo: Nl LV/RV fxn. Mild PR, triv TR. Stable ASD occluder; b. 01/2016 Echo: EF 60-65%, no rwma.  . Syncope and collapse    a. With second pregnancy-->02/2016 Holter: sinus rhythm, elevated HR likely 2/2 pregnancy. No arrhythmias.   Past Surgical History:  Procedure Laterality Date  . CARDIAC SURGERY  11/2000  . COLONOSCOPY WITH PROPOFOL N/A 12/21/2018   Procedure: COLONOSCOPY WITH PROPOFOL;  Surgeon: [name], MD;  Location: [facility] ENDOSCOPY;  Service: Gastroenterology;  Laterality: N/A;  . HERNIA REPAIR  11/25/95   congential diaphragmatic hernia repair done at 72 weeks of age at [facility]  . TUBAL LIGATION Bilateral 05/09/2018   Procedure: POST PARTUM TUBAL LIGATION;  Surgeon: Vena Austria, MD;  Location: [facility] ORS;  Service: Gynecology;  Laterality: Bilateral;   Allergies  Allergen Reactions  . Erythromycin  Anaphylaxis, Swelling and Nausea And Vomiting   No current facility-administered medications on file prior to encounter.   No current outpatient medications on file prior to encounter.   Social History   Socioeconomic History  . Marital status: Single    Spouse name: Not on file  . Number of children: 2  . Years of education: 44  . Highest education level: Not on file  Occupational History  . 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 Smoking status: Never Smoker  . Smokeless tobacco: Never Used  Vaping Use  . Vaping Use: Never used  Substance and Sexual Activity  . Alcohol use: Yes    Comment: occ  . Drug use: Yes    Frequency: 2.0 times per week    Types: Marijuana    Comment: ACID  . Sexual activity: Yes    Birth control/protection: Other-see comments, Surgical    Comment: Tubal ligation  Other Topics Concern  . Not on file  Social History Narrative  . Not on file   Social Determinants of Health   Financial Resource Strain: Not on file  Food Insecurity: Not on file  Transportation Needs: Not on file  Physical Activity: Not on file  Stress: Not on file  Social Connections: Not on file  Intimate Partner Violence: Not on file   Family History  Problem Relation Age of Onset  . Migraines Mother   . Kidney  disease Father   . Obesity Father   . Heart failure Father   . Scoliosis Sister   . Emphysema Maternal Grandmother   . Heart failure Maternal Grandmother     OBJECTIVE:  Vitals:   07/29/20 1109  BP: (!) 108/58  Pulse: 68  Resp: 18  Temp: 98.1 F (36.7 C)  TempSrc: Oral  SpO2: 96%     Physical Exam Vitals and nursing note reviewed.  Constitutional:      General: She is not in acute distress.    Appearance: Normal appearance. She is normal weight. She is not ill-appearing, toxic-appearing or diaphoretic.  HENT:     Head: Normocephalic.  Cardiovascular:     Rate and Rhythm:  Normal rate and regular rhythm.     Pulses: Normal pulses.     Heart sounds: Normal heart sounds. No murmur heard. No friction rub. No gallop.   Pulmonary:     Effort: Pulmonary effort is normal. No respiratory distress.     Breath sounds: Normal breath sounds. No stridor. No wheezing, rhonchi or rales.  Chest:     Chest wall: No tenderness.  Neurological:     Mental Status: She is alert and oriented to person, place, and time.  Psychiatric:        Attention and Perception: Attention normal.        Mood and Affect: Mood is anxious.        Behavior: Behavior normal.        Thought Content: Thought content normal. Thought content is not delusional. Thought content does not include homicidal or suicidal ideation.        Judgment: Judgment normal.     LABS:  No results found for this or any previous visit (from the past 24 hour(s)).   ASSESSMENT & PLAN:  1. Anxiety   2. COVID-19 virus infection     Meds ordered this encounter  Medications  . hydrOXYzine (ATARAX/VISTARIL) 25 MG tablet    Sig: Take 1 tablet (25 mg total) by mouth every 6 (six) hours as needed.    Dispense:  30 tablet    Refill:  0    Discharge Instructions  Rest and drink fluids Eat a well-balanced diet, and avoid excessive caffeine intake Continue with hydroxyzine nightly for symptom relief Some things you may try doing to help alleviate your symptoms include: keeping a journal, exercise, talking to a friend or relative, listening to music, going for a walk or hike outside, or other activities that you may find enjoyable PCP assistance initiated Recommending further evaluation and management with PCP Return or go to ER if you have any new or worsening symptoms such as fever, chills, fatigue, worsening shortness of breath, wheezing, chest pain, nausea, vomiting, abdominal pain, changes in bowel or bladder habits, etc...   Reviewed expectations re: course of current medical issues. Questions  answered. Outlined signs and symptoms indicating need for more acute intervention. Patient verbalized understanding. After Visit Summary given.         Durward Parcel, FNP 07/29/20 1239

## 2020-07-29 NOTE — ED Triage Notes (Signed)
Patient presents to Urgent Care with complaints of history of anxiety, most recently has had increased panic attacks episodes.  Pt states she was seen at the ED related to panic attack. At this time denies chest discomfort. She states at this time she does not have an established PCP unable fill meds due to changes in [place].

## 2020-07-29 NOTE — Discharge Instructions (Addendum)
Rest and drink fluids Eat a well-balanced diet, and avoid excessive caffeine intake Continue with hydroxyzine nightly for symptom relief Some things you may try doing to help alleviate your symptoms include: keeping a journal, exercise, talking to a friend or relative, listening to music, going for a walk or hike outside, or other activities that you may find enjoyable PCP assistance initiated Recommending further evaluation and management with PCP Return or go to ER if you have any new or worsening symptoms such as fever, chills, fatigue, worsening shortness of breath, wheezing, chest pain, nausea, vomiting, abdominal pain, changes in bowel or bladder habits, etc... 

## 2021-03-14 ENCOUNTER — Other Ambulatory Visit: Payer: Self-pay

## 2021-03-14 ENCOUNTER — Encounter: Payer: Self-pay | Admitting: Emergency Medicine

## 2021-03-14 ENCOUNTER — Ambulatory Visit
Admission: EM | Admit: 2021-03-14 | Discharge: 2021-03-14 | Disposition: A | Discharge status: Home / Self Care | Payer: Medicaid Other | Attending: Emergency Medicine | Admitting: Emergency Medicine

## 2021-03-14 DIAGNOSIS — F411 Generalized anxiety disorder: Secondary | ICD-10-CM

## 2021-03-14 DIAGNOSIS — J069 Acute upper respiratory infection, unspecified: Secondary | ICD-10-CM

## 2021-03-14 MED ORDER — HYDROXYZINE HCL 25 MG PO TABS: 25.0000 mg | ORAL_TABLET | Freq: Every evening | ORAL | 0 refills | Status: DC | PRN

## 2021-03-14 MED ORDER — BENZONATATE 100 MG PO CAPS: 100.0000 mg | ORAL_CAPSULE | Freq: Three times a day (TID) | ORAL | 0 refills | Status: DC

## 2021-03-14 NOTE — Discharge Instructions (Signed)
Declines covid test Get plenty of rest and push fluids Hydroxyzine refilled.  Do not take while driving as this medication may make you drowsy Tessalon Perles prescribed for cough Use OTC zyrtec for nasal congestion, runny nose, and/or sore throat Use OTC flonase for nasal congestion and runny nose Use medications daily for symptom relief Use OTC medications like ibuprofen or tylenol as needed fever or pain Call or go to the ED if you have any new or worsening symptoms such as fever, worsening cough, shortness of breath, chest tightness, chest pain, turning blue, changes in mental status, etc..Marland Kitchen

## 2021-03-14 NOTE — ED Provider Notes (Signed)
[facility] CARE CENTER   [id number] 03/14/21 Arrival Time: 1204   CC: COVID symptoms  SUBJECTIVE: History from: patient.  Vicki Adams is a 25 y.o. female who presents with sinus congestion, cough and anxiety x few days.  Children with similar symptoms.  Denies alleviating or aggravating factors.  Reports previous symptoms in the past.   Denies fever, chills, SOB, wheezing, chest pain, nausea, changes in bowel or bladder habits.     ROS: As per HPI.  All other pertinent ROS negative.     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 11/2000 s/p closure @ [facility]; b. 04/2015 Echo: Nl LV/RV fxn. Mild PR, triv TR. Stable ASD occluder; b. 01/2016 Echo: EF 60-65%, no rwma.   Syncope and collapse    a. With second pregnancy-->02/2016 Holter: sinus rhythm, elevated HR likely 2/2 pregnancy. No arrhythmias.   Past Surgical History:  Procedure Laterality Date   CARDIAC SURGERY  11/2000   COLONOSCOPY WITH PROPOFOL N/A 12/21/2018   Procedure: COLONOSCOPY WITH PROPOFOL;  Surgeon: [name], MD;  Location: [facility] ENDOSCOPY;  Service: Gastroenterology;  Laterality: N/A;   HERNIA REPAIR  04/18/1996   congential diaphragmatic hernia repair done at 26 weeks of age at [facility]   TUBAL LIGATION Bilateral 05/09/2018   Procedure: POST PARTUM TUBAL LIGATION;  Surgeon: Vena Austria, MD;  Location: [facility] ORS;  Service: Gynecology;  Laterality: Bilateral;   Allergies  Allergen Reactions   Erythromycin Anaphylaxis, Swelling and Nausea And Vomiting   No current facility-administered medications on file prior to encounter.   No current outpatient medications on file prior to encounter.   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  Tobacco Use   Smoking status: Never   Smokeless tobacco: Never  Vaping Use   Vaping Use: Never used  Substance and Sexual Activity   Alcohol use: Yes    Comment: occ   Drug use: Yes    Frequency: 2.0 times per week    Types: Marijuana    Comment: ACID   Sexual activity: Yes    Birth control/protection: Other-see comments, Surgical    Comment: Tubal ligation  Other Topics Concern   Not on file  Social History Narrative   Not on file   Social Determinants of Health   Financial Resource Strain: Not on file  Food Insecurity: Not on file  Transportation Needs: Not on file  Physical Activity: Not on file  Stress: Not on file  Social Connections: Not on file  Intimate Partner Violence: Not on file   Family History  Problem Relation Age of Onset   Migraines Mother    Kidney disease Father    Obesity Father    Heart failure Father    Scoliosis Sister    Emphysema Maternal Grandmother    Heart failure Maternal Grandmother     OBJECTIVE:  Vitals:   03/14/21 1212  BP: 111/72  Pulse: 80  Resp: 16  Temp: 98.1 F (36.7 C)  TempSrc: Oral  SpO2: 97%  Weight: 200 lb 11.2 oz (91 kg)     General appearance:  alert; appears fatigued, but nontoxic; speaking in full sentences and tolerating own secretions HEENT: NCAT; Ears: EACs clear, TMs pearly gray; Eyes: PERRL.  EOM grossly intact. Sinuses: nontender; Nose: nares patent without rhinorrhea, Throat: oropharynx clear, tonsils non erythematous or enlarged, uvula midline  Neck: supple without LAD Lungs: unlabored respirations, symmetrical air entry; cough: mild; no respiratory distress; CTAB Heart: regular rate and rhythm.   Skin: warm and dry Psychological: alert and cooperative; normal mood and affect  ASSESSMENT & PLAN:  1. Viral URI with cough     Meds ordered this encounter  Medications   hydrOXYzine (ATARAX/VISTARIL) 25 MG tablet    Sig: Take 1  tablet (25 mg total) by mouth at bedtime as needed.    Dispense:  30 tablet    Refill:  0    Order Specific Question:   Supervising Provider    Answer:   Eustace Moore [[id number]]   benzonatate (TESSALON) 100 MG capsule    Sig: Take 1 capsule (100 mg total) by mouth every 8 (eight) hours.    Dispense:  21 capsule    Refill:  0    Order Specific Question:   Supervising Provider    Answer:   Eustace Moore [[id number]]   Declines covid test Get plenty of rest and push fluids Hydroxyzine refilled.  Do not take while driving as this medication may make you drowsy Tessalon Perles prescribed for cough Use OTC zyrtec for nasal congestion, runny nose, and/or sore throat Use OTC flonase for nasal congestion and runny nose Use medications daily for symptom relief Use OTC medications like ibuprofen or tylenol as needed fever or pain Call or go to the ED if you have any new or worsening symptoms such as fever, worsening cough, shortness of breath, chest tightness, chest pain, turning blue, changes in mental status, etc...   Reviewed expectations re: course of current medical issues. Questions answered. Outlined signs and symptoms indicating need for more acute intervention. Patient verbalized understanding. After Visit Summary given.          Rennis Harding, PA-C 03/14/21 1231

## 2021-03-14 NOTE — ED Triage Notes (Signed)
Needs a refill on anxiety medication hydroxyzine 25mg .  Chest congestion and nasal congestion x 2 days.  States her children had same symptoms and tested covid negative.

## 2021-05-09 ENCOUNTER — Ambulatory Visit
Admission: EM | Admit: 2021-05-09 | Discharge: 2021-05-09 | Disposition: A | Discharge status: Home / Self Care | Payer: Medicaid Other

## 2021-05-09 ENCOUNTER — Other Ambulatory Visit: Payer: Self-pay

## 2021-05-28 ENCOUNTER — Emergency Department (HOSPITAL_COMMUNITY)
Admission: EM | Admit: 2021-05-28 | Discharge: 2021-05-28 | Disposition: A | Discharge status: Home / Self Care | Payer: Medicaid Other | Attending: Emergency Medicine | Admitting: Emergency Medicine

## 2021-05-28 ENCOUNTER — Encounter (HOSPITAL_COMMUNITY): Payer: Self-pay

## 2021-05-28 ENCOUNTER — Other Ambulatory Visit: Payer: Self-pay

## 2021-05-28 DIAGNOSIS — R000 Tachycardia, unspecified: Secondary | ICD-10-CM | POA: Diagnosis not present

## 2021-05-28 DIAGNOSIS — Z20822 Contact with and (suspected) exposure to covid-19: Secondary | ICD-10-CM | POA: Insufficient documentation

## 2021-05-28 DIAGNOSIS — J101 Influenza due to other identified influenza virus with other respiratory manifestations: Secondary | ICD-10-CM

## 2021-05-28 DIAGNOSIS — R509 Fever, unspecified: Secondary | ICD-10-CM | POA: Diagnosis present

## 2021-05-28 LAB — RESP PANEL BY RT-PCR (FLU A&B, COVID) ARPGX2
Influenza A by PCR: POSITIVE — AB
Influenza B by PCR: NEGATIVE
SARS Coronavirus 2 by RT PCR: NEGATIVE

## 2021-05-28 MED ORDER — IBUPROFEN 400 MG PO TABS
600.0000 mg | ORAL_TABLET | Freq: Once | ORAL | Status: AC
  Administered 2021-05-28: 600 mg via ORAL
  Filled 2021-05-28: qty 2

## 2021-05-28 NOTE — ED Provider Notes (Signed)
[facility] EMERGENCY DEPARTMENT Provider Note   CSN: [id number] Arrival date & time: 05/28/21  0825     History Chief Complaint  Patient presents with   URI    Vicki Adams is a 25 y.o. female.  Patient presents to ER chief complaint of fever cough body aches and chills with nasal congestion.  Symptoms ongoing since yesterday.  Denies any vomiting or diarrhea.  Multiple family members are sick with influenza A.      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 11/2000 s/p closure @ [facility]; b. 04/2015 Echo: Nl LV/RV fxn. Mild PR, triv TR. Stable ASD occluder; b. 01/2016 Echo: EF 60-65%, no rwma.   Syncope and collapse    a. With second pregnancy-->02/2016 Holter: sinus rhythm, elevated HR likely 2/2 pregnancy. No arrhythmias.    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   History of percutaneous transcatheter closure of congenital ASD 01/24/2015   History of esophageal hernia repair 01/24/2015    Past Surgical History:  Procedure Laterality Date   CARDIAC SURGERY  11/2000   COLONOSCOPY WITH PROPOFOL N/A 12/21/2018   Procedure: COLONOSCOPY WITH PROPOFOL;  Surgeon: [name], MD;  Location: [facility] ENDOSCOPY;  Service: Gastroenterology;  Laterality: N/A;   HERNIA REPAIR  11-Jul-1995   congential diaphragmatic hernia repair done at 47 weeks of age at [facility]   TUBAL LIGATION Bilateral 05/09/2018   Procedure: POST PARTUM TUBAL LIGATION;  Surgeon: Vena Austria, MD;  Location: [facility] ORS;  Service: Gynecology;  Laterality: Bilateral;     OB History     Gravida  3   Para  3   Term  3   Preterm  0   AB  0   Living  3      SAB  0   IAB  0   Ectopic  0   Multiple  0   Live Births  3           Family History  Problem Relation Age of Onset    Migraines Mother    Kidney disease Father    Obesity Father    Heart failure Father    Scoliosis Sister    Emphysema Maternal Grandmother    Heart failure Maternal Grandmother     Social History   Tobacco Use   Smoking status: Never   Smokeless tobacco: Never  Vaping Use   Vaping Use: Never used  Substance Use Topics   Alcohol use: Yes    Comment: occ   Drug use: Yes    Frequency: 2.0 times per week    Types: Marijuana    Comment: ACID    Home Medications Prior to Admission medications   Medication Sig Start Date End Date Taking? Authorizing Provider  benzonatate (TESSALON) 100 MG capsule Take 1 capsule (100 mg total) by mouth every 8 (eight) hours. 03/14/21   Wurst, [place], PA-C  hydrOXYzine (ATARAX/VISTARIL) 25 MG tablet Take 1 tablet (25 mg total) by mouth at bedtime as needed. 03/14/21   Wurst, [place], PA-C    Allergies    Erythromycin  Review of Systems   Review of Systems  Constitutional:  Positive for fever.  HENT:  Negative for ear pain.   Eyes:  Negative for pain.  Respiratory:  Positive for cough.  Cardiovascular:  Negative for chest pain.  Gastrointestinal:  Negative for abdominal pain.  Genitourinary:  Negative for flank pain.  Musculoskeletal:  Negative for back pain.  Skin:  Negative for rash.  Neurological:  Negative for headaches.   Physical Exam Updated Vital Signs BP 117/76 (BP Location: Right Arm)   Pulse 95   Temp (!) 101.3 F (38.5 C) (Oral)   Resp 16   Ht 5\' 7"  (1.702 m)   Wt 86.2 kg   SpO2 99%   BMI 29.76 kg/m   Physical Exam Constitutional:      General: She is not in acute distress.    Appearance: Normal appearance.  HENT:     Head: Normocephalic.     Nose: Nose normal.  Eyes:     Extraocular Movements: Extraocular movements intact.  Cardiovascular:     Rate and Rhythm: Tachycardia present.  Pulmonary:     Effort: Pulmonary effort is normal.  Musculoskeletal:        General: Normal range of motion.     Cervical  back: Normal range of motion.  Neurological:     General: No focal deficit present.     Mental Status: She is alert. Mental status is at baseline.    ED Results / Procedures / Treatments   Labs (all labs ordered are listed, but only abnormal results are displayed) Labs Reviewed  RESP PANEL BY RT-PCR (FLU A&B, COVID) ARPGX2 - Abnormal; Notable for the following components:      Result Value   Influenza A by PCR POSITIVE (*)    All other components within normal limits    EKG None  Radiology No results found.  Procedures Procedures   Medications Ordered in ED Medications  ibuprofen (ADVIL) tablet 600 mg (600 mg Oral Given 05/28/21 14/7/22)    ED Course  I have reviewed the triage vital signs and the nursing notes.  Pertinent labs & imaging results that were available during my care of the patient were reviewed by me and considered in my medical decision making (see chart for details).    MDM Rules/Calculators/A&P                           Patient with mild tachycardia presenting with influenza A findings.  Consistent with lab test.  Recommending oral hydration Tylenol Motrin as needed for fevers and pains at home.  Advised isolation for 5 days.  Advised immediate return for worsening symptoms or any additional concerns.  Advise follow-up with primary care doctor in 3 to 4 days.  Final Clinical Impression(s) / ED Diagnoses Final diagnoses:  Influenza A    Rx / DC Orders ED Discharge Orders     None        1601, MD 05/28/21 1002

## 2021-05-28 NOTE — ED Triage Notes (Signed)
Cold symptoms that started the previous day.

## 2021-05-28 NOTE — Discharge Instructions (Signed)
Call your primary care doctor or specialist as discussed in the next 2-3 days.   Return immediately back to the ER if:  Your symptoms worsen within the next 12-24 hours. You develop new symptoms such as new fevers, persistent vomiting, new pain, shortness of breath, or new weakness or numbness, or if you have any other concerns.  

## 2021-07-03 ENCOUNTER — Encounter: Payer: Self-pay | Admitting: Otolaryngology

## 2021-07-04 ENCOUNTER — Other Ambulatory Visit (HOSPITAL_BASED_OUTPATIENT_CLINIC_OR_DEPARTMENT_OTHER): Payer: Self-pay

## 2021-07-04 DIAGNOSIS — G4733 Obstructive sleep apnea (adult) (pediatric): Secondary | ICD-10-CM

## 2021-07-14 NOTE — Discharge Instructions (Signed)
T & A INSTRUCTION SHEET - [facility]  P. SCOTT BENNETT, MD  INFORMATION SHEET FOR A TONSILLECTOMY AND ADENDOIDECTOMY  About Your Tonsils and Adenoids The tonsils and adenoids are normal body tissues that are part of our immune system. They normally help to protect us against diseases that may enter our mouth and nose. However, sometimes the tonsils and/or adenoids become too large and obstruct our breathing, especially at night.  If either of these things happen it helps to remove the tonsils and adenoids in order to become healthier. The operation to remove the tonsils and adenoids is called a tonsillectomy and adenoidectomy.  The Location of Your Tonsils and Adenoids The tonsils are located in the back of the throat on both side and sit in a cradle of muscles. The adenoids are located in the roof of the mouth, behind the nose, and closely associated with the opening of the Eustachian tube to the ear.  Surgery on Tonsils and Adenoids A tonsillectomy and adenoidectomy is a short operation which takes about thirty minutes. This includes being put to sleep and being awakened. Tonsillectomies and adenoidectomies are performed at [facility] and may require observation period in the recovery room prior to going home. Children are required to remain in the recovery area for 45 minutes after surgery.  Following the Operation for a Tonsillectomy A cautery machine is used to control bleeding.  Bleeding from a tonsillectomy and adenoidectomy is minimal and postoperatively the risk of bleeding is approximately four percent, although this rarely life threatening.  After your tonsillectomy and adenoidectomy post-op care at home: 1. Our patients are able to go home the same day. You may be given prescriptions for pain medications and antibiotics, if indicated. 2. It is extremely important to remember that fluid intake is of utmost importance after a  tonsillectomy. The amount that you drink must be maintained in the postoperative period. A good indication of whether a child is getting enough fluid is whether his/her urine output is constant.  As long as children are urinating or wetting their diaper every 6 - 8 hours this is usually enough fluid intake.   3. Although rare, this is a risk of some bleeding in the first ten days after surgery. This usually occurs between day five and nine postoperatively. This risk of bleeding is approximately four percent.  If you or your child should have any bleeding you should remain calm and notify our office or go directly to the Emergency Room at [facility] where they will contact us. Our doctors are available seven days a week for notification. We recommend sitting up quietly in a chair, place an ice pack on the front of the neck and spitting out the blood gently until we are able to contact you. Adults should gargle gently with ice water and this may help stop the bleeding. If the bleeding does not stop after a short time, i.e. 10 to 15 minutes, or seems to be increasing again, please contact us or go to the hospital.   4. It is common for the pain to be worse at 5 - 7 days postoperatively. This occurs because the "scab" is peeling off and the mucous membrane (skin of the throat) is growing back where the tonsils were.   5. It is common for a low-grade fever, less than 102, during the first week after a tonsillectomy and adenoidectomy. It is usually due to not drinking enough   liquids, and we suggest your use liquid Tylenol (acetaminophen) or the pain medicine with Tylenol (acetaminophen) prescribed in order to keep your temperature below 102. Please follow the directions on the back of the bottle. 6. Do not take aspirin or any products that contain aspirin such as Bufferin, Anacin, Ecotrin, aspirin gum, Goodies, BC headache powders, etc., after a T&A because it can promote bleeding.  DO NOT TAKE  MOTRIN OR IBUPROFEN. Please check with our office before administering any other medication that may been prescribed by other doctors during the two-week post-operative period. 7. If you happen to look in the mirror or into your child's mouth you will see white/gray patches on the back of the throat.  This is what a scab looks like in the mouth and is normal after having a tonsillectomy and adenoidectomy. It will disappear once the tonsil area heals completely. However, it may cause a noticeable odor, and this too will disappear with time.     8. You or your child may experience ear pain after having a tonsillectomy and adenoidectomy. This is called referred pain and comes from the throat, but it is felt in the ears. Ear pain is quite common and expected. It will usually go away after ten days. There is usually nothing wrong with the ears, and it is primarily due to the healing area stimulating the nerve to the ear that runs along the side of the throat. Use either the prescribed pain medicine or Tylenol (acetaminophen) as needed.  9. The throat tissues after a tonsillectomy are obviously sensitive. Smoking around children who have had a tonsillectomy significantly increases the risk of bleeding.  DO NOT SMOKE! What to Expect Each Day  First Day at Home 1. Patients will be discharged home the same day.  2. Drink at least four glasses of liquid a day. Clear, cool liquids are recommended. Fruit juices containing citric acid are not recommended because they tend to cause pain. Carbonated beverages are allowed if you pour them from glass to glass to remove the bubbles as these tend to cause discomfort. Avoid alcoholic beverages.  3. Eat very soft foods such as soups, broth, jello, custard, pudding, ice cream, popsicles, applesauce, mashed potatoes, and in general anything that you can crush between your tongue and the roof of your mouth. Try adding Carnation Instant Breakfast Mix into your food for extra  calories. It is not uncommon to lose 5 to 10 pounds of fluid weight. The weight will be gained back quickly once you're feeling better and drinking more.  4. Sleep with your head elevated on two pillows for about three days to help decrease the swelling.  5. DO NOT SMOKE!  Day Two  1. Rest as much as possible. Use common sense in your activities.  2. Continue drinking at least four glasses of liquid per day.  3. Follow the soft diet.  4. Use your pain medication as needed.  Day Three  1. Advance your activity as you are able and continue to follow the previous day's suggestions.  Days Four Through Six  1. Advance your diet and begin to eat more solid foods such as chopped hamburger. 2. Advance your activities slowly. Children should be kept mostly around the house.  3. Not uncommonly, there will be more pain at this time. It is temporary, usually lasting a day or two.  Day Seven Through Ten  1. Most individuals by this time are able to return to work or school unless otherwise instructed.   Consider sending children back to school for a half day on the first day back. 

## 2021-07-15 ENCOUNTER — Other Ambulatory Visit: Payer: Self-pay

## 2021-07-15 ENCOUNTER — Ambulatory Visit: Payer: Medicaid Other | Admitting: Anesthesiology

## 2021-07-15 ENCOUNTER — Encounter: Payer: Self-pay | Admitting: Otolaryngology

## 2021-07-15 ENCOUNTER — Ambulatory Visit
Admission: RE | Admit: 2021-07-15 | Discharge: 2021-07-15 | Disposition: A | Discharge status: Home / Self Care | Payer: Medicaid Other | Attending: Otolaryngology | Admitting: Otolaryngology

## 2021-07-15 ENCOUNTER — Encounter
Admission: RE | Disposition: A | Discharge status: Home / Self Care | Payer: Self-pay | Source: Home / Self Care | Attending: Otolaryngology

## 2021-07-15 DIAGNOSIS — J45909 Unspecified asthma, uncomplicated: Secondary | ICD-10-CM | POA: Diagnosis not present

## 2021-07-15 DIAGNOSIS — F419 Anxiety disorder, unspecified: Secondary | ICD-10-CM | POA: Diagnosis not present

## 2021-07-15 DIAGNOSIS — J353 Hypertrophy of tonsils with hypertrophy of adenoids: Secondary | ICD-10-CM | POA: Diagnosis present

## 2021-07-15 HISTORY — PX: TONSILLECTOMY AND ADENOIDECTOMY: SHX28

## 2021-07-15 HISTORY — DX: Unspecified asthma, uncomplicated: J45.909

## 2021-07-15 HISTORY — DX: Migraine, unspecified, not intractable, without status migrainosus: G43.909

## 2021-07-15 LAB — POCT PREGNANCY, URINE: Preg Test, Ur: NEGATIVE

## 2021-07-15 SURGERY — TONSILLECTOMY AND ADENOIDECTOMY
Anesthesia: General | Laterality: Bilateral

## 2021-07-15 MED ORDER — OXYCODONE HCL 5 MG/5ML PO SOLN: 5.0000 mg | Freq: Once | ORAL | Status: DC | PRN

## 2021-07-15 MED ORDER — FENTANYL CITRATE (PF) 100 MCG/2ML IJ SOLN
INTRAMUSCULAR | Status: DC | PRN
Start: 2021-07-15 — End: 2021-07-15
  Administered 2021-07-15: 25 ug via INTRAVENOUS
  Administered 2021-07-15: 50 ug via INTRAVENOUS
  Administered 2021-07-15: 25 ug via INTRAVENOUS

## 2021-07-15 MED ORDER — OXYMETAZOLINE HCL 0.05 % NA SOLN
NASAL | Status: DC | PRN
  Administered 2021-07-15: 1 via TOPICAL

## 2021-07-15 MED ORDER — LIDOCAINE HCL (CARDIAC) PF 100 MG/5ML IV SOSY
PREFILLED_SYRINGE | INTRAVENOUS | Status: DC | PRN
  Administered 2021-07-15: 50 mg via INTRAVENOUS

## 2021-07-15 MED ORDER — ACETAMINOPHEN 325 MG PO TABS: 325.0000 mg | ORAL_TABLET | ORAL | Status: DC | PRN

## 2021-07-15 MED ORDER — ACETAMINOPHEN 160 MG/5ML PO SOLN: 325.0000 mg | ORAL | Status: DC | PRN

## 2021-07-15 MED ORDER — SCOPOLAMINE 1 MG/3DAYS TD PT72
1.0000 | MEDICATED_PATCH | Freq: Once | TRANSDERMAL | Status: DC
  Administered 2021-07-15: 09:00:00 1.5 mg via TRANSDERMAL

## 2021-07-15 MED ORDER — BUPIVACAINE HCL 0.25 % IJ SOLN
INTRAMUSCULAR | Status: DC | PRN
  Administered 2021-07-15: 3 mL

## 2021-07-15 MED ORDER — PROPOFOL 10 MG/ML IV BOLUS
INTRAVENOUS | Status: DC | PRN
Start: 2021-07-15 — End: 2021-07-15
  Administered 2021-07-15: 200 mg via INTRAVENOUS

## 2021-07-15 MED ORDER — DEXAMETHASONE SODIUM PHOSPHATE 4 MG/ML IJ SOLN
INTRAMUSCULAR | Status: DC | PRN
  Administered 2021-07-15: 8 mg via INTRAVENOUS

## 2021-07-15 MED ORDER — DEXMEDETOMIDINE (PRECEDEX) IN NS 20 MCG/5ML (4 MCG/ML) IV SYRINGE
PREFILLED_SYRINGE | INTRAVENOUS | Status: DC | PRN
  Administered 2021-07-15 (×2): 5 ug via INTRAVENOUS
  Administered 2021-07-15: 10 ug via INTRAVENOUS

## 2021-07-15 MED ORDER — OXYCODONE HCL 5 MG PO TABS: 5.0000 mg | ORAL_TABLET | Freq: Once | ORAL | Status: DC | PRN

## 2021-07-15 MED ORDER — MIDAZOLAM HCL 5 MG/5ML IJ SOLN
INTRAMUSCULAR | Status: DC | PRN
  Administered 2021-07-15: 2 mg via INTRAVENOUS

## 2021-07-15 MED ORDER — FENTANYL CITRATE PF 50 MCG/ML IJ SOSY: 25.0000 ug | PREFILLED_SYRINGE | INTRAMUSCULAR | Status: DC | PRN

## 2021-07-15 MED ORDER — GLYCOPYRROLATE 0.2 MG/ML IJ SOLN
INTRAMUSCULAR | Status: DC | PRN
  Administered 2021-07-15: .1 mg via INTRAVENOUS

## 2021-07-15 MED ORDER — ACETAMINOPHEN 10 MG/ML IV SOLN
1000.0000 mg | Freq: Once | INTRAVENOUS | Status: AC
  Administered 2021-07-15: 09:00:00 1000 mg via INTRAVENOUS

## 2021-07-15 MED ORDER — PREDNISOLONE SODIUM PHOSPHATE 15 MG/5ML PO SOLN: ORAL | 0 refills | Status: AC

## 2021-07-15 MED ORDER — HYDROCODONE-ACETAMINOPHEN 7.5-325 MG/15ML PO SOLN: ORAL | 0 refills | Status: AC

## 2021-07-15 MED ORDER — LACTATED RINGERS IV SOLN: INTRAVENOUS | Status: DC

## 2021-07-15 MED ORDER — ONDANSETRON HCL 4 MG/2ML IJ SOLN
INTRAMUSCULAR | Status: DC | PRN
Start: 2021-07-15 — End: 2021-07-15
  Administered 2021-07-15: 4 mg via INTRAVENOUS

## 2021-07-15 MED ORDER — SUCCINYLCHOLINE CHLORIDE 200 MG/10ML IV SOSY
PREFILLED_SYRINGE | INTRAVENOUS | Status: DC | PRN
  Administered 2021-07-15: 100 mg via INTRAVENOUS

## 2021-07-15 SURGICAL SUPPLY — 16 items
BLADE BOVIE TIP EXT 4 (BLADE) ×3 IMPLANT
CANISTER SUCT 1200ML W/VALVE (MISCELLANEOUS) ×3 IMPLANT
CATH ROBINSON RED A/P 10FR (CATHETERS) ×3 IMPLANT
COAG SUCT 10F 3.5MM HAND CTRL (MISCELLANEOUS) ×3 IMPLANT
DECANTER SPIKE VIAL GLASS SM (MISCELLANEOUS) IMPLANT
ELECT REM PT RETURN 9FT ADLT (ELECTROSURGICAL) ×3
ELECTRODE REM PT RTRN 9FT ADLT (ELECTROSURGICAL) ×1 IMPLANT
GLOVE SURG ENC MOIS LTX SZ7.5 (GLOVE) ×3 IMPLANT
KIT TURNOVER KIT A (KITS) ×3 IMPLANT
NS IRRIG 500ML POUR BTL (IV SOLUTION) ×3 IMPLANT
PACK TONSIL AND ADENOID CUSTOM (PACKS) ×3 IMPLANT
PENCIL SMOKE EVACUATOR (MISCELLANEOUS) ×3 IMPLANT
SLEEVE SUCTION 125 (MISCELLANEOUS) ×3 IMPLANT
SOL ANTI-FOG 6CC FOG-OUT (MISCELLANEOUS) ×1 IMPLANT
SOL FOG-OUT ANTI-FOG 6CC (MISCELLANEOUS) ×2
SPONGE TONSIL 1 RF SGL (DISPOSABLE) IMPLANT

## 2021-07-15 NOTE — H&P (Signed)
History and physical reviewed and will be scanned in later. No change in medical status reported by the patient or family, appears stable for surgery. All questions regarding the procedure answered, and patient (or family if a child) expressed understanding of the procedure. ? ?Vicki Adams ?@[email or web address]@ ?

## 2021-07-15 NOTE — Transfer of Care (Signed)
Immediate Anesthesia Transfer of Care Note  Patient: Vicki Adams  Procedure(s) Performed: TONSILLECTOMY POSSIBLE ADENOIDECTOMY (Bilateral)  Patient Location: PACU  Anesthesia Type: General  Level of Consciousness: awake, alert  and patient cooperative  Airway and Oxygen Therapy: Patient Spontanous Breathing and Patient connected to supplemental oxygen  Post-op Assessment: Post-op Vital signs reviewed, Patient's Cardiovascular Status Stable, Respiratory Function Stable, Patent Airway and No signs of Nausea or vomiting  Post-op Vital Signs: Reviewed and stable  Complications: No notable events documented.

## 2021-07-15 NOTE — Anesthesia Postprocedure Evaluation (Signed)
Anesthesia Post Note  Patient: Vicki Adams  Procedure(s) Performed: TONSILLECTOMY POSSIBLE ADENOIDECTOMY (Bilateral)     Patient location during evaluation: PACU Anesthesia Type: General Level of consciousness: awake Pain management: pain level controlled Vital Signs Assessment: post-procedure vital signs reviewed and stable Respiratory status: respiratory function stable Cardiovascular status: stable Postop Assessment: no signs of nausea or vomiting Anesthetic complications: no   No notable events documented.  Jola Babinski

## 2021-07-15 NOTE — Anesthesia Preprocedure Evaluation (Signed)
Anesthesia Evaluation  Patient identified by MRN, date of birth, ID band Patient awake    Reviewed: Allergy & Precautions, NPO status   Airway Mallampati: II  TM Distance: >3 FB     Dental   Pulmonary asthma ,    Pulmonary exam normal        Cardiovascular negative cardio ROS   Rhythm:Regular Rate:Normal     Neuro/Psych  Headaches, PSYCHIATRIC DISORDERS Anxiety    GI/Hepatic negative GI ROS,   Endo/Other  BMI 30  Renal/GU      Musculoskeletal   Abdominal   Peds  Hematology   Anesthesia Other Findings Tonsillolithiasis and tonsillar hypertrophy  Reproductive/Obstetrics                             Anesthesia Physical Anesthesia Plan  ASA: 2  Anesthesia Plan: General   Post-op Pain Management:    Induction: Intravenous  PONV Risk Score and Plan: 4 or greater and Ondansetron, Dexamethasone, Midazolam, Treatment may vary due to age or medical condition and Scopolamine patch - Pre-op  Airway Management Planned: Oral ETT  Additional Equipment:   Intra-op Plan:   Post-operative Plan:   Informed Consent: I have reviewed the patients History and Physical, chart, labs and discussed the procedure including the risks, benefits and alternatives for the proposed anesthesia with the patient or authorized representative who has indicated his/her understanding and acceptance.     Dental advisory given  Plan Discussed with: CRNA  Anesthesia Plan Comments:         Anesthesia Quick Evaluation

## 2021-07-15 NOTE — Op Note (Signed)
07/15/2021  10:06 AM    Vicki Adams  [id number]   Pre-Op Diagnosis:  Tonsillolithiasis, Hypertrophy of tonsils  Post-op Diagnosis: Tonsillolithiasis, adenotonsillar hypertrophy  Procedure: Adenotonsillectomy  Surgeon: Sandi Mealy., MD  Anesthesia:  General endotracheal  EBL:  Less than 25 cc  Complications:  None  Findings: 3+ cryptic tonsils, moderately large adenoids  Procedure: The patient was taken to the Operating Room and placed in the supine position.  After induction of general endotracheal anesthesia, the table was turned 90 degrees and the patient was draped in the usual fashion for adenoidectomy with the eyes protected.  A mouth gag was inserted into the oral cavity to open the mouth, and examination of the oropharynx showed the uvula was non-bifid. The palate was palpated, and there was no evidence of submucous cleft.  A red rubber catheter was placed through the nostril and used to retract the palate.  Examination of the nasopharynx showed moderately obstructing adenoids.  Under indirect vision with the mirror, an adenotome was placed in the nasopharynx.  The adenoids were curetted free.  Reinspection with a mirror showed excellent removal of the adenoids.  Afrin moistened nasopharyngeal packs were then placed to control bleeding.  The nasopharyngeal packs were removed.  Suction cautery was then used to cauterize the nasopharyngeal bed to obtain hemostasis.   The right tonsil was grasped with an Allis clamp and resected from the tonsillar fossa in the usual fashion with the Bovie. The left tonsil was resected in the same fashion. The Bovie was used to obtain hemostasis. Each tonsillar fossa was then carefully injected with 0.25% marcaine, avoiding intravascular injection. The nose and throat were irrigated and suctioned to remove any adenoid debris or blood clot. The red rubber catheter and mouth gag were  removed with no evidence of active bleeding.  The  patient was then returned to the anesthesiologist for awakening, and was taken to the Recovery Room in stable condition.  Cultures:  None.  Specimens:  Adenoids and tonsils.  Disposition:   PACU to home  Plan: Soft, bland diet and push fluids. Take pain medications and prednisone as prescribed. No strenuous activity for 2 weeks. Follow-up in 3 weeks.  Sandi Mealy 07/15/2021 10:06 AM

## 2021-07-15 NOTE — Anesthesia Procedure Notes (Signed)
Procedure Name: Intubation Date/Time: 07/15/2021 9:38 AM Performed by: Jimmy Picket, CRNA Pre-anesthesia Checklist: Patient identified, Emergency Drugs available, Suction available, Patient being monitored and Timeout performed Patient Re-evaluated:Patient Re-evaluated prior to induction Oxygen Delivery Method: Circle system utilized Preoxygenation: Pre-oxygenation with 100% oxygen Induction Type: IV induction Ventilation: Mask ventilation without difficulty Laryngoscope Size: Miller and 2 Grade View: Grade I Tube type: Oral Rae Tube size: 7.0 mm Number of attempts: 1 Placement Confirmation: ETT inserted through vocal cords under direct vision, positive ETCO2 and breath sounds checked- equal and bilateral Tube secured with: Tape Dental Injury: Teeth and Oropharynx as per pre-operative assessment

## 2021-07-16 ENCOUNTER — Encounter: Payer: Self-pay | Admitting: Otolaryngology

## 2021-07-17 LAB — SURGICAL PATHOLOGY

## 2021-08-23 ENCOUNTER — Emergency Department (HOSPITAL_COMMUNITY)
Admission: EM | Admit: 2021-08-23 | Discharge: 2021-08-23 | Disposition: A | Discharge status: Home / Self Care | Payer: Self-pay | Attending: Emergency Medicine | Admitting: Emergency Medicine

## 2021-08-23 ENCOUNTER — Emergency Department (HOSPITAL_COMMUNITY): Payer: Self-pay

## 2021-08-23 ENCOUNTER — Other Ambulatory Visit: Payer: Self-pay

## 2021-08-23 ENCOUNTER — Encounter (HOSPITAL_COMMUNITY): Payer: Self-pay

## 2021-08-23 DIAGNOSIS — E876 Hypokalemia: Secondary | ICD-10-CM | POA: Insufficient documentation

## 2021-08-23 DIAGNOSIS — R112 Nausea with vomiting, unspecified: Secondary | ICD-10-CM | POA: Insufficient documentation

## 2021-08-23 DIAGNOSIS — R519 Headache, unspecified: Secondary | ICD-10-CM | POA: Insufficient documentation

## 2021-08-23 DIAGNOSIS — J45909 Unspecified asthma, uncomplicated: Secondary | ICD-10-CM | POA: Insufficient documentation

## 2021-08-23 DIAGNOSIS — R1032 Left lower quadrant pain: Secondary | ICD-10-CM | POA: Insufficient documentation

## 2021-08-23 DIAGNOSIS — D649 Anemia, unspecified: Secondary | ICD-10-CM | POA: Insufficient documentation

## 2021-08-23 DIAGNOSIS — R0602 Shortness of breath: Secondary | ICD-10-CM | POA: Insufficient documentation

## 2021-08-23 DIAGNOSIS — Z20822 Contact with and (suspected) exposure to covid-19: Secondary | ICD-10-CM | POA: Insufficient documentation

## 2021-08-23 LAB — POC URINE PREG, ED: Preg Test, Ur: NEGATIVE

## 2021-08-23 LAB — COMPREHENSIVE METABOLIC PANEL
ALT: 19 U/L (ref 0–44)
AST: 24 U/L (ref 15–41)
Albumin: 4.1 g/dL (ref 3.5–5.0)
Alkaline Phosphatase: 59 U/L (ref 38–126)
Anion gap: 9 (ref 5–15)
BUN: 11 mg/dL (ref 6–20)
CO2: 23 mmol/L (ref 22–32)
Calcium: 8.9 mg/dL (ref 8.9–10.3)
Chloride: 106 mmol/L (ref 98–111)
Creatinine, Ser: 0.7 mg/dL (ref 0.44–1.00)
GFR, Estimated: >60 mL/min (ref >60)
Glucose, Bld: 108 mg/dL — ABNORMAL HIGH (ref 70–99)
Potassium: 3 mmol/L — ABNORMAL LOW (ref 3.5–5.1)
Sodium: 138 mmol/L (ref 135–145)
Total Bilirubin: 0.4 mg/dL (ref 0.3–1.2)
Total Protein: 7.3 g/dL (ref 6.5–8.1)

## 2021-08-23 LAB — URINALYSIS, ROUTINE W REFLEX MICROSCOPIC
Bilirubin Urine: NEGATIVE
Glucose, UA: NEGATIVE mg/dL
Hgb urine dipstick: NEGATIVE
Ketones, ur: 5 mg/dL — AB
Leukocytes,Ua: NEGATIVE
Nitrite: NEGATIVE
Protein, ur: NEGATIVE mg/dL
Specific Gravity, Urine: 1.013 (ref 1.005–1.030)
pH: 9 — ABNORMAL HIGH (ref 5.0–8.0)

## 2021-08-23 LAB — LIPASE, BLOOD: Lipase: 34 U/L (ref 11–51)

## 2021-08-23 LAB — RESP PANEL BY RT-PCR (FLU A&B, COVID) ARPGX2
Influenza A by PCR: NEGATIVE
Influenza B by PCR: NEGATIVE
SARS Coronavirus 2 by RT PCR: NEGATIVE

## 2021-08-23 LAB — CBC
HCT: 34.3 % — ABNORMAL LOW (ref 36.0–46.0)
Hemoglobin: 10.5 g/dL — ABNORMAL LOW (ref 12.0–15.0)
MCH: 23.9 pg — ABNORMAL LOW (ref 26.0–34.0)
MCHC: 30.6 g/dL (ref 30.0–36.0)
MCV: 78 fL — ABNORMAL LOW (ref 80.0–100.0)
Platelets: 199 10*3/uL (ref 150–400)
RBC: 4.4 MIL/uL (ref 3.87–5.11)
RDW: 17.4 % — ABNORMAL HIGH (ref 11.5–15.5)
WBC: 9.8 10*3/uL (ref 4.0–10.5)
nRBC: 0 % (ref 0.0–0.2)

## 2021-08-23 LAB — MAGNESIUM: Magnesium: 1.9 mg/dL (ref 1.7–2.4)

## 2021-08-23 MED ORDER — ONDANSETRON HCL 4 MG/2ML IJ SOLN
4.0000 mg | Freq: Once | INTRAMUSCULAR | Status: AC
  Administered 2021-08-23: 4 mg via INTRAVENOUS
  Filled 2021-08-23: qty 2

## 2021-08-23 MED ORDER — KETOROLAC TROMETHAMINE 15 MG/ML IJ SOLN
15.0000 mg | Freq: Once | INTRAMUSCULAR | Status: AC
Start: 2021-08-23 — End: 2021-08-23
  Administered 2021-08-23: 15 mg via INTRAVENOUS
  Filled 2021-08-23: qty 1

## 2021-08-23 MED ORDER — PROCHLORPERAZINE EDISYLATE 10 MG/2ML IJ SOLN
10.0000 mg | Freq: Once | INTRAMUSCULAR | Status: AC
  Administered 2021-08-23: 10 mg via INTRAVENOUS
  Filled 2021-08-23: qty 2

## 2021-08-23 MED ORDER — POTASSIUM CHLORIDE 20 MEQ PO PACK
40.0000 meq | PACK | Freq: Two times a day (BID) | ORAL | Status: DC
  Administered 2021-08-23: 40 meq via ORAL
  Filled 2021-08-23: qty 2

## 2021-08-23 MED ORDER — LACTATED RINGERS IV BOLUS
1000.0000 mL | Freq: Once | INTRAVENOUS | Status: AC
Start: 2021-08-23 — End: 2021-08-23
  Administered 2021-08-23: 1000 mL via INTRAVENOUS

## 2021-08-23 MED ORDER — IOHEXOL 300 MG/ML  SOLN
100.0000 mL | Freq: Once | INTRAMUSCULAR | Status: AC | PRN
  Administered 2021-08-23: 100 mL via INTRAVENOUS

## 2021-08-23 MED ORDER — LACTATED RINGERS IV BOLUS
1000.0000 mL | Freq: Once | INTRAVENOUS | Status: AC
  Administered 2021-08-23: 1000 mL via INTRAVENOUS

## 2021-08-23 MED ORDER — DIPHENHYDRAMINE HCL 50 MG/ML IJ SOLN
12.5000 mg | Freq: Once | INTRAMUSCULAR | Status: AC
  Administered 2021-08-23: 12.5 mg via INTRAVENOUS
  Filled 2021-08-23: qty 1

## 2021-08-23 NOTE — ED Notes (Signed)
Pt stated that she wanted her IV out and to go home. She said that she "was freaking out over the iv and felt better and would like to leave" Iv  removed  ?

## 2021-08-23 NOTE — ED Triage Notes (Signed)
Pov from home. Cc of feeling sick since last night. Said she had a headache and started throwing up. Said her kids were sick too ?

## 2021-08-23 NOTE — ED Provider Notes (Signed)
[facility] EMERGENCY DEPARTMENT Provider Note   CSN: [id number] Arrival date & time: 08/23/21  1855     History  Chief Complaint  Patient presents with   Emesis    Vicki Adams is a 26 y.o. female.   Emesis Associated symptoms: abdominal pain and headaches   Patient presents for multiple symptoms.  Yesterday, she had a frontal headache that was consistent with prior migraines.  She did have some nausea and vomiting from that.  Her headache was resolved today but then she developed a recurrence of headache more on the vertex of her scalp.  She had recurrence of nausea and vomiting.  She also developed left lower quadrant abdominal pain.  When she was checking into the ED, she also developed some shortness of breath.  Patient reports that currently her shortness of breath has resolved.  She does still have a dull aching left lower quadrant abdominal pain.  Last menstrual period was approximately 1 week ago.  Last bowel movement was this morning and was normal.  She does endorse continued nausea.    Home Medications Prior to Admission medications   Medication Sig Start Date End Date Taking? Authorizing Provider  albuterol (VENTOLIN HFA) 108 (90 Base) MCG/ACT inhaler Inhale into the lungs every 6 (six) hours as needed for wheezing or shortness of breath.    [provider]  HYDROcodone-acetaminophen (HYCET) 7.5-325 mg/15 ml solution 10-15 cc PO every 4-6 hours as needed for pain 07/15/21   Clyde Canterbury, MD  prednisoLONE (ORAPRED) 15 MG/5ML solution 10 cc PO BID x 3 days, then 5 cc PO BID x 3 days, then 5 cc PO QD x 3 days 07/15/21   Clyde Canterbury, MD      Allergies    Erythromycin    Review of Systems   Review of Systems  Respiratory:  Positive for shortness of breath.   Gastrointestinal:  Positive for abdominal pain, nausea and vomiting.  Neurological:  Positive for headaches.   Physical Exam Updated Vital Signs BP (!) 116/57    Pulse 70    Temp 97.9 F  (36.6 C) (Oral)    Resp 20    Ht 5\' 7"  (1.702 m)    Wt 86.6 kg    LMP 08/03/2021 Comment: tibal lit   SpO2 98%    BMI 29.91 kg/m  Physical Exam Vitals and nursing note reviewed.  Constitutional:      General: She is not in acute distress.    Appearance: Normal appearance. She is well-developed and normal weight. She is not ill-appearing, toxic-appearing or diaphoretic.  HENT:     Head: Normocephalic and atraumatic.     Right Ear: External ear normal.     Left Ear: External ear normal.     Nose: Nose normal.     Mouth/Throat:     Mouth: Mucous membranes are moist.     Pharynx: Oropharynx is clear.  Eyes:     Extraocular Movements: Extraocular movements intact.     Conjunctiva/sclera: Conjunctivae normal.  Cardiovascular:     Rate and Rhythm: Normal rate and regular rhythm.     Heart sounds: No murmur heard. Pulmonary:     Effort: Pulmonary effort is normal. No respiratory distress.     Breath sounds: Normal breath sounds. No wheezing or rales.  Chest:     Chest wall: No tenderness.  Abdominal:     Palpations: Abdomen is soft.     Tenderness: There is abdominal tenderness. There is no guarding or  rebound.  Musculoskeletal:        General: No swelling. Normal range of motion.     Cervical back: Normal range of motion and neck supple. No rigidity or tenderness.     Right lower leg: No edema.     Left lower leg: No edema.  Skin:    General: Skin is warm and dry.     Capillary Refill: Capillary refill takes less than 2 seconds.     Coloration: Skin is not jaundiced or pale.  Neurological:     General: No focal deficit present.     Mental Status: She is alert and oriented to person, place, and time.     Cranial Nerves: No cranial nerve deficit.     Sensory: No sensory deficit.     Motor: No weakness.     Coordination: Coordination normal.  Psychiatric:        Mood and Affect: Mood normal.        Thought Content: Thought content normal.        Judgment: Judgment normal.     ED Results / Procedures / Treatments   Labs (all labs ordered are listed, but only abnormal results are displayed) Labs Reviewed  COMPREHENSIVE METABOLIC PANEL - Abnormal; Notable for the following components:      Result Value   Potassium 3.0 (*)    Glucose, Bld 108 (*)    All other components within normal limits  CBC - Abnormal; Notable for the following components:   Hemoglobin 10.5 (*)    HCT 34.3 (*)    MCV 78.0 (*)    MCH 23.9 (*)    RDW 17.4 (*)    All other components within normal limits  URINALYSIS, ROUTINE W REFLEX MICROSCOPIC - Abnormal; Notable for the following components:   Color, Urine STRAW (*)    pH 9.0 (*)    Ketones, ur 5 (*)    All other components within normal limits  RESP PANEL BY RT-PCR (FLU A&B, COVID) ARPGX2  LIPASE, BLOOD  MAGNESIUM  POC URINE PREG, ED    EKG None  Radiology CT ABDOMEN PELVIS W CONTRAST  Result Date: 08/23/2021 CLINICAL DATA:  Left lower quadrant abdominal pain, nausea, vomiting EXAM: CT ABDOMEN AND PELVIS WITH CONTRAST TECHNIQUE: Multidetector CT imaging of the abdomen and pelvis was performed using the standard protocol following bolus administration of intravenous contrast. RADIATION DOSE REDUCTION: This exam was performed according to the departmental dose-optimization program which includes automated exposure control, adjustment of the mA and/or kV according to patient size and/or use of iterative reconstruction technique. CONTRAST:  181mL OMNIPAQUE IOHEXOL 300 MG/ML  SOLN COMPARISON:  None. FINDINGS: Lower chest: No acute abnormality. Amplatzer type atrial septal occlusion device (series 2, image 1). Small hiatal hernia with intrathoracic position of the gastric fundus. Hepatobiliary: No solid liver abnormality is seen. No gallstones, gallbladder wall thickening, or biliary dilatation. Pancreas: Unremarkable. No pancreatic ductal dilatation or surrounding inflammatory changes. Spleen: Normal in size without significant  abnormality. Adrenals/Urinary Tract: Adrenal glands are unremarkable. Kidneys are normal, without renal calculi, solid lesion, or hydronephrosis. Bladder is unremarkable. Stomach/Bowel: Stomach is within normal limits. Appendix appears normal. No evidence of bowel wall thickening, distention, or inflammatory changes. Vascular/Lymphatic: No significant vascular findings are present. No enlarged abdominal or pelvic lymph nodes. Reproductive: No mass or other significant abnormality. Subcentimeter ovarian follicles. Other: No abdominal wall hernia or abnormality. No ascites. Musculoskeletal: No acute or significant osseous findings. IMPRESSION: 1. No acute CT findings of the abdomen  or pelvis to explain left lower quadrant abdominal pain. 2. Small hiatal hernia. Electronically Signed   By: Delanna Ahmadi M.D.   On: 08/23/2021 21:48    Procedures Procedures    Medications Ordered in ED Medications  lactated ringers bolus 1,000 mL (0 mLs Intravenous Stopped 08/23/21 2128)  ondansetron (ZOFRAN) injection 4 mg (4 mg Intravenous Given 08/23/21 2030)  iohexol (OMNIPAQUE) 300 MG/ML solution 100 mL (100 mLs Intravenous Contrast Given 08/23/21 2127)  ketorolac (TORADOL) 15 MG/ML injection 15 mg (15 mg Intravenous Given 08/23/21 2224)  prochlorperazine (COMPAZINE) injection 10 mg (10 mg Intravenous Given 08/23/21 2226)  diphenhydrAMINE (BENADRYL) injection 12.5 mg (12.5 mg Intravenous Given 08/23/21 2231)  lactated ringers bolus 1,000 mL (0 mLs Intravenous Stopped 08/23/21 2305)    ED Course/ Medical Decision Making/ A&P                           Medical Decision Making Amount and/or Complexity of Data Reviewed Labs: ordered. Radiology: ordered.  Risk Prescription drug management.   This patient presents to the ED for concern of vomiting, this involves an extensive number of treatment options, and is a complaint that carries with it a high risk of complications and morbidity.  The differential diagnosis includes  gastroenteritis, gastritis, pregnancy, pneumonia, pancreatitis, viral illness   Co morbidities that complicate the patient evaluation  GAD, panic attacks, anemia, asthma, migraines   Additional history obtained:  Additional history obtained from N/A External records from outside source obtained and reviewed including EMR   Lab Tests:  I Ordered, and personally interpreted labs.  The pertinent results include: Hypokalemia, baseline anemia, no leukocytosis, normal transaminases, normal lipase   Imaging Studies ordered:  I ordered imaging studies including CT of abdomen pelvis I independently visualized and interpreted imaging which showed no acute findings I agree with the radiologist interpretation   Cardiac Monitoring:  The patient was maintained on a cardiac monitor.  I personally viewed and interpreted the cardiac monitored which showed an underlying rhythm of: Sinus rhythm   Medicines ordered and prescription drug management:  I ordered medication including IV fluids for dehydration, Zofran for antiemetic; headache cocktail for headache; potassium chloride for hypokalemia Reevaluation of the patient after these medicines showed that the patient improved I have reviewed the patients home medicines and have made adjustments as needed  Problem List / ED Course:  26 year old female presenting for multiple complaints.  She had onset of migraine headaches with nausea and vomiting yesterday.  Today, she had further nausea with vomiting in addition to some left lower quadrant abdominal pain.  Initially, patient was given IV fluids and Zofran for symptomatic relief and rehydration.  Laboratory work-up was initiated.  Following negative pregnancy test, patient was ordered a CT scan of abdomen pelvis, given her left lower quadrant tenderness.  Results of scan showed no acute findings.  Given the mild nature of her abdominal pain and tenderness, I have low suspicion for ovarian torsion.   On reassessment, patient reports resolved abdominal pain and nausea but does endorse headache.  She does have a history of migraines.  Migraine cocktail was ordered.  Patient did report relief of her migraine headache and resolution of all symptoms.  At this time, she became anxious about having the IV in her arm.  She did request discharge home.  Given her reassuring work-up and resolution of symptoms, I feel that this is appropriate.  Patient was discharged in good condition.  Final Clinical Impression(s) / ED Diagnoses Final diagnoses:  Bad headache  Nausea and vomiting, unspecified vomiting type    Rx / DC Orders ED Discharge Orders     None         Godfrey Pick, MD 08/24/21 1447

## 2021-11-12 ENCOUNTER — Encounter (HOSPITAL_BASED_OUTPATIENT_CLINIC_OR_DEPARTMENT_OTHER): Payer: Self-pay

## 2021-11-12 DIAGNOSIS — R0683 Snoring: Secondary | ICD-10-CM

## 2021-11-13 ENCOUNTER — Emergency Department (HOSPITAL_COMMUNITY)
Admission: EM | Admit: 2021-11-13 | Discharge: 2021-11-13 | Disposition: A | Discharge status: Home / Self Care | Payer: Medicaid Other | Attending: Emergency Medicine | Admitting: Emergency Medicine

## 2021-11-13 ENCOUNTER — Emergency Department (HOSPITAL_COMMUNITY): Payer: Medicaid Other

## 2021-11-13 ENCOUNTER — Encounter (HOSPITAL_COMMUNITY): Payer: Self-pay | Admitting: Emergency Medicine

## 2021-11-13 ENCOUNTER — Other Ambulatory Visit: Payer: Self-pay

## 2021-11-13 DIAGNOSIS — Y92410 Unspecified street and highway as the place of occurrence of the external cause: Secondary | ICD-10-CM | POA: Diagnosis not present

## 2021-11-13 DIAGNOSIS — M25512 Pain in left shoulder: Secondary | ICD-10-CM | POA: Diagnosis not present

## 2021-11-13 DIAGNOSIS — J45909 Unspecified asthma, uncomplicated: Secondary | ICD-10-CM | POA: Diagnosis not present

## 2021-11-13 DIAGNOSIS — R1032 Left lower quadrant pain: Secondary | ICD-10-CM

## 2021-11-13 DIAGNOSIS — K449 Diaphragmatic hernia without obstruction or gangrene: Secondary | ICD-10-CM | POA: Diagnosis not present

## 2021-11-13 LAB — I-STAT CHEM 8, ED
BUN: 6 mg/dL (ref 6–20)
Calcium, Ion: 1.11 mmol/L — ABNORMAL LOW (ref 1.15–1.40)
Chloride: 105 mmol/L (ref 98–111)
Creatinine, Ser: 0.7 mg/dL (ref 0.44–1.00)
Glucose, Bld: 102 mg/dL — ABNORMAL HIGH (ref 70–99)
HCT: 35 % — ABNORMAL LOW (ref 36.0–46.0)
Hemoglobin: 11.9 g/dL — ABNORMAL LOW (ref 12.0–15.0)
Potassium: 3.7 mmol/L (ref 3.5–5.1)
Sodium: 139 mmol/L (ref 135–145)
TCO2: 27 mmol/L (ref 22–32)

## 2021-11-13 MED ORDER — HYDROCODONE-ACETAMINOPHEN 5-325 MG PO TABS
1.0000 | ORAL_TABLET | Freq: Once | ORAL | Status: AC
  Administered 2021-11-13: 1 via ORAL
  Filled 2021-11-13: qty 1

## 2021-11-13 MED ORDER — METHOCARBAMOL 500 MG PO TABS: 500.0000 mg | ORAL_TABLET | Freq: Two times a day (BID) | ORAL | 0 refills | Status: AC

## 2021-11-13 MED ORDER — IOHEXOL 300 MG/ML  SOLN
100.0000 mL | Freq: Once | INTRAMUSCULAR | Status: AC | PRN
  Administered 2021-11-13: 100 mL via INTRAVENOUS
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
## 2022-01-13 ENCOUNTER — Other Ambulatory Visit: Payer: Self-pay

## 2022-01-13 ENCOUNTER — Emergency Department (HOSPITAL_COMMUNITY): Payer: Self-pay

## 2022-01-13 ENCOUNTER — Encounter (HOSPITAL_COMMUNITY): Payer: Self-pay | Admitting: Emergency Medicine

## 2022-01-13 ENCOUNTER — Emergency Department (HOSPITAL_COMMUNITY)
Admission: EM | Admit: 2022-01-13 | Discharge: 2022-01-13 | Disposition: A | Discharge status: Home / Self Care | Payer: Self-pay | Attending: Emergency Medicine | Admitting: Emergency Medicine

## 2022-01-13 DIAGNOSIS — K529 Noninfective gastroenteritis and colitis, unspecified: Secondary | ICD-10-CM | POA: Insufficient documentation

## 2022-01-13 LAB — COMPREHENSIVE METABOLIC PANEL
ALT: 23 U/L (ref 0–44)
AST: 21 U/L (ref 15–41)
Albumin: 3.8 g/dL (ref 3.5–5.0)
Alkaline Phosphatase: 55 U/L (ref 38–126)
Anion gap: 7 (ref 5–15)
BUN: 9 mg/dL (ref 6–20)
CO2: 26 mmol/L (ref 22–32)
Calcium: 8.7 mg/dL — ABNORMAL LOW (ref 8.9–10.3)
Chloride: 107 mmol/L (ref 98–111)
Creatinine, Ser: 0.68 mg/dL (ref 0.44–1.00)
GFR, Estimated: >60 mL/min (ref >60)
Glucose, Bld: 111 mg/dL — ABNORMAL HIGH (ref 70–99)
Potassium: 3.5 mmol/L (ref 3.5–5.1)
Sodium: 140 mmol/L (ref 135–145)
Total Bilirubin: 0.4 mg/dL (ref 0.3–1.2)
Total Protein: 6.7 g/dL (ref 6.5–8.1)

## 2022-01-13 LAB — CBC WITH DIFFERENTIAL/PLATELET
Abs Immature Granulocytes: 0.03 10*3/uL (ref 0.00–0.07)
Basophils Absolute: 0 10*3/uL (ref 0.0–0.1)
Basophils Relative: 0 %
Eosinophils Absolute: 0.3 10*3/uL (ref 0.0–0.5)
Eosinophils Relative: 3 %
HCT: 32.4 % — ABNORMAL LOW (ref 36.0–46.0)
Hemoglobin: 10.5 g/dL — ABNORMAL LOW (ref 12.0–15.0)
Immature Granulocytes: 0 %
Lymphocytes Relative: 23 %
Lymphs Abs: 2.1 10*3/uL (ref 0.7–4.0)
MCH: 25.2 pg — ABNORMAL LOW (ref 26.0–34.0)
MCHC: 32.4 g/dL (ref 30.0–36.0)
MCV: 77.9 fL — ABNORMAL LOW (ref 80.0–100.0)
Monocytes Absolute: 0.9 10*3/uL (ref 0.1–1.0)
Monocytes Relative: 10 %
Neutro Abs: 5.7 10*3/uL (ref 1.7–7.7)
Neutrophils Relative %: 64 %
Platelets: 194 10*3/uL (ref 150–400)
RBC: 4.16 MIL/uL (ref 3.87–5.11)
RDW: 18.8 % — ABNORMAL HIGH (ref 11.5–15.5)
WBC: 9 10*3/uL (ref 4.0–10.5)
nRBC: 0 % (ref 0.0–0.2)

## 2022-01-13 LAB — URINALYSIS, ROUTINE W REFLEX MICROSCOPIC
Bacteria, UA: NONE SEEN
Bilirubin Urine: NEGATIVE
Glucose, UA: NEGATIVE mg/dL
Ketones, ur: NEGATIVE mg/dL
Nitrite: NEGATIVE
Protein, ur: NEGATIVE mg/dL
RBC / HPF: >50 RBC/hpf — ABNORMAL HIGH (ref 0–5)
Specific Gravity, Urine: 1.017 (ref 1.005–1.030)
pH: 5 (ref 5.0–8.0)

## 2022-01-13 LAB — PREGNANCY, URINE: Preg Test, Ur: NEGATIVE

## 2022-01-13 LAB — LIPASE, BLOOD: Lipase: 59 U/L — ABNORMAL HIGH (ref 11–51)

## 2022-01-13 MED ORDER — ONDANSETRON 4 MG PO TBDP: 4.0000 mg | ORAL_TABLET | Freq: Three times a day (TID) | ORAL | 0 refills | Status: AC | PRN

## 2022-01-13 MED ORDER — KETOROLAC TROMETHAMINE 30 MG/ML IJ SOLN
30.0000 mg | Freq: Once | INTRAMUSCULAR | Status: AC
  Administered 2022-01-13: 30 mg via INTRAVENOUS
  Filled 2022-01-13: qty 1

## 2022-01-13 MED ORDER — ONDANSETRON HCL 4 MG/2ML IJ SOLN
4.0000 mg | Freq: Once | INTRAMUSCULAR | Status: DC
  Filled 2022-01-13: qty 2

## 2022-01-13 MED ORDER — MORPHINE SULFATE (PF) 4 MG/ML IV SOLN
4.0000 mg | Freq: Once | INTRAVENOUS | Status: AC
  Administered 2022-01-13: 4 mg via INTRAVENOUS
  Filled 2022-01-13: qty 1

## 2022-01-13 MED ORDER — DICYCLOMINE HCL 20 MG PO TABS: 20.0000 mg | ORAL_TABLET | Freq: Two times a day (BID) | ORAL | 0 refills | Status: AC | PRN

## 2022-01-13 MED ORDER — IOHEXOL 300 MG/ML  SOLN
100.0000 mL | Freq: Once | INTRAMUSCULAR | Status: AC | PRN
  Administered 2022-01-13: 100 mL via INTRAVENOUS

## 2022-01-13 MED ORDER — DICYCLOMINE HCL 10 MG PO CAPS
10.0000 mg | ORAL_CAPSULE | Freq: Once | ORAL | Status: AC
  Administered 2022-01-13: 10 mg via ORAL
  Filled 2022-01-13: qty 1

## 2022-01-13 NOTE — ED Notes (Signed)
Patient given a cup of water to drink. 

## 2022-01-13 NOTE — ED Provider Notes (Signed)
[facility] EMERGENCY DEPARTMENT Provider Note   CSN: [id number] Arrival date & time: 01/13/22  0053     History  Chief Complaint  Patient presents with   Abdominal Pain         Vicki Adams is a 26 y.o. female.  HPI     This is a 26 year old female who presents with abdominal pain and nausea.  She also reports some urinary frequency.  Patient reports onset of symptoms over the last 24 hours.  She reports mid abdominal discomfort that she describes as crampy and sharp.  It is nonradiating.  She has had nausea which she relates to the pain.  She has had 1 episode of nonbilious, nonbloody emesis.  She does not believe herself to be pregnant.  She just started her menstrual period.  She reports urinary frequency but no dysuria hesitancy, urgency.  Denies back pain or fevers.  Home Medications Prior to Admission medications   Medication Sig Start Date End Date Taking? Authorizing Provider  dicyclomine (BENTYL) 20 MG tablet Take 1 tablet (20 mg total) by mouth 2 (two) times daily as needed for spasms. 01/13/22  Yes Trystian Crisanto, Mayer Masker, MD  ondansetron (ZOFRAN-ODT) 4 MG disintegrating tablet Take 1 tablet (4 mg total) by mouth every 8 (eight) hours as needed for nausea or vomiting. 01/13/22  Yes Frazer Rainville, Mayer Masker, MD  albuterol (VENTOLIN HFA) 108 (90 Base) MCG/ACT inhaler Inhale into the lungs every 6 (six) hours as needed for wheezing or shortness of breath.    [provider]  HYDROcodone-acetaminophen (HYCET) 7.5-325 mg/15 ml solution 10-15 cc PO every 4-6 hours as needed for pain 07/15/21   Geanie Logan, MD  methocarbamol (ROBAXIN) 500 MG tablet Take 1 tablet (500 mg total) by mouth 2 (two) times daily. 11/13/21   Darrick Grinder, PA-C  prednisoLONE (ORAPRED) 15 MG/5ML solution 10 cc PO BID x 3 days, then 5 cc PO BID x 3 days, then 5 cc PO QD x 3 days 07/15/21   Geanie Logan, MD      Allergies    Erythromycin    Review of Systems   Review of Systems   Constitutional:  Negative for fever.  Gastrointestinal:  Positive for abdominal pain, nausea and vomiting.  Genitourinary:  Positive for frequency.  All other systems reviewed and are negative.   Physical Exam Updated Vital Signs BP 117/73   Pulse 62   Temp 98 F (36.7 C) (Oral)   Resp 18   Ht 1.702 m (5\' 7" )   Wt 86.2 kg   LMP 01/13/2022   SpO2 96%   BMI 29.76 kg/m  Physical Exam Vitals and nursing note reviewed.  Constitutional:      Appearance: She is well-developed. She is obese. She is not ill-appearing.  HENT:     Head: Normocephalic and atraumatic.  Eyes:     Pupils: Pupils are equal, round, and reactive to light.  Cardiovascular:     Rate and Rhythm: Normal rate and regular rhythm.     Heart sounds: Normal heart sounds.  Pulmonary:     Effort: Pulmonary effort is normal. No respiratory distress.     Breath sounds: No wheezing.  Abdominal:     General: Bowel sounds are normal.     Palpations: Abdomen is soft.     Tenderness: There is abdominal tenderness in the periumbilical area. There is no guarding or rebound.  Musculoskeletal:     Cervical back: Neck supple.  Skin:    General:  Skin is warm and dry.  Neurological:     Mental Status: She is alert and oriented to person, place, and time.  Psychiatric:        Mood and Affect: Mood normal.     ED Results / Procedures / Treatments   Labs (all labs ordered are listed, but only abnormal results are displayed) Labs Reviewed  URINALYSIS, ROUTINE W REFLEX MICROSCOPIC - Abnormal; Notable for the following components:      Result Value   Hgb urine dipstick SMALL (*)    Leukocytes,Ua TRACE (*)    RBC / HPF >50 (*)    All other components within normal limits  CBC WITH DIFFERENTIAL/PLATELET - Abnormal; Notable for the following components:   Hemoglobin 10.5 (*)    HCT 32.4 (*)    MCV 77.9 (*)    MCH 25.2 (*)    RDW 18.8 (*)    All other components within normal limits  COMPREHENSIVE METABOLIC PANEL -  Abnormal; Notable for the following components:   Glucose, Bld 111 (*)    Calcium 8.7 (*)    All other components within normal limits  LIPASE, BLOOD - Abnormal; Notable for the following components:   Lipase 59 (*)    All other components within normal limits  PREGNANCY, URINE    EKG None  Radiology CT ABDOMEN PELVIS W CONTRAST  Result Date: 01/13/2022 CLINICAL DATA:  Abdominal pain EXAM: CT ABDOMEN AND PELVIS WITH CONTRAST TECHNIQUE: Multidetector CT imaging of the abdomen and pelvis was performed using the standard protocol following bolus administration of intravenous contrast. RADIATION DOSE REDUCTION: This exam was performed according to the departmental dose-optimization program which includes automated exposure control, adjustment of the mA and/or kV according to patient size and/or use of iterative reconstruction technique. CONTRAST:  158mL OMNIPAQUE IOHEXOL 300 MG/ML  SOLN COMPARISON:  11/13/2021 FINDINGS: Lower chest: Lung bases are clear. Hepatobiliary: Liver is within normal limits. Gallbladder is unremarkable. No intrahepatic or extrahepatic ductal dilatation. Pancreas: Within normal limits. Spleen: Within normal limits. Adrenals/Urinary Tract: Adrenal glands are within normal limits. Subcentimeter left lower pole renal cyst (series 2/image 42). Right kidney is within normal limits. No hydronephrosis. Bladder is thick-walled although underdistended. Stomach/Bowel: Moderate paraesophageal hernia. No evidence of bowel obstruction. Mildly thick-walled but nondilated loops of small bowel in the anterior mid abdomen with mild mesenteric stranding (series 2/image 89), suggesting small bowel enteritis. Normal appendix (series 2/image 67). No colonic wall thickening or inflammatory changes. Vascular/Lymphatic: No evidence of abdominal aortic aneurysm. Small ileocolic nodes, reactive. No suspicious abdominopelvic lymphadenopathy. Reproductive: Uterus is within normal limits. Bilateral ovaries  are within normal limits. Other: No abdominopelvic ascites. Musculoskeletal: Visualized osseous structures are within normal limits. IMPRESSION: Suspected small bowel enteritis. No evidence of bowel obstruction. Normal appendix. Moderate paraesophageal hernia. Electronically Signed   By: Julian Hy M.D.   On: 01/13/2022 03:44    Procedures Procedures    Medications Ordered in ED Medications  ondansetron [facility]) injection 4 mg (0 mg Intravenous Hold 01/13/22 0216)  morphine (PF) 4 MG/ML injection 4 mg (4 mg Intravenous Given 01/13/22 0211)  iohexol (OMNIPAQUE) 300 MG/ML solution 100 mL (100 mLs Intravenous Contrast Given 01/13/22 0326)  dicyclomine (BENTYL) capsule 10 mg (10 mg Oral Given 01/13/22 0411)  ketorolac (TORADOL) 30 MG/ML injection 30 mg (30 mg Intravenous Given 01/13/22 0447)    ED Course/ Medical Decision Making/ A&P  Medical Decision Making Amount and/or Complexity of Data Reviewed Labs: ordered. Radiology: ordered.  Risk Prescription drug management.   This patient presents to the ED for concern of abdominal pain, this involves an extensive number of treatment options, and is a complaint that carries with it a high risk of complications and morbidity.  I considered the following differential and admission for this acute, potentially life threatening condition.  The differential diagnosis includes pancreatitis, cholecystitis, gastroenteritis, appendicitis, UTI/pyelonephritis, less likely obstruction  MDM:    This is a 26 year old female who presents with abdominal discomfort, nausea and vomiting.  She is nontoxic and vital signs are largely reassuring.  She has some periumbilical tenderness on exam.  She is not peritonitic.  Patient was given fluids, pain and nausea medication.  No leukocytosis.  No significant metabolic abnormality.  Lipase very minimally elevated at 59.  Do not feel this is consistent with pancreatitis.  Urinalysis without  obvious urinary tract infection.  CT scan obtained given persistent pain while in the emergency department.  CT scan is consistent with enteritis.  Likely viral in nature given that she is otherwise well-appearing.  Will discharge with Bentyl and Zofran.  (Labs, imaging, consults)  Labs: I Ordered, and personally interpreted labs.  The pertinent results include: CBC, CMP, lipase, urinalysis, urine pregnancy  Imaging Studies ordered: I ordered imaging studies including CT abdomen and pelvis I independently visualized and interpreted imaging. I agree with the radiologist interpretation  Additional history obtained from chart review.  External records from outside source obtained and reviewed including prior evaluation  Cardiac Monitoring: The patient was maintained on a cardiac monitor.  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 04/2015 Echo: Nl LV/RV fxn. Mild PR, triv TR. Stable ASD occluder; b. 01/2016 Echo: EF 60-65%, no rwma.   Syncope and collapse    a. With second pregnancy-->02/2016 Holter: sinus rhythm, elevated HR likely 2/2 pregnancy. No arrhythmias.     Medicines Meds ordered this encounter  Medications   morphine (PF) 4 MG/ML injection 4 mg   ondansetron (ZOFRAN) injection 4 mg   iohexol (OMNIPAQUE) 300 MG/ML solution 100 mL   dicyclomine (BENTYL) capsule 10 mg   ketorolac (TORADOL) 30 MG/ML injection 30 mg   dicyclomine (BENTYL) 20 MG tablet     Sig: Take 1 tablet (20 mg total) by mouth 2 (two) times daily as needed for spasms.    Dispense:  20 tablet    Refill:  0   ondansetron (ZOFRAN-ODT) 4 MG disintegrating tablet    Sig: Take 1 tablet (4 mg total) by mouth every 8 (eight) hours as needed for nausea or vomiting.    Dispense:  20 tablet    Refill:  0    I have reviewed the patients home medicines and have made adjustments as needed  Problem List / ED Course: Problem List Items Addressed This Visit   None Visit Diagnoses     Enteritis    -  Primary  Final Clinical Impression(s) / ED Diagnoses Final diagnoses:  Enteritis    Rx / DC Orders ED Discharge Orders          Ordered    dicyclomine (BENTYL) 20 MG tablet  2 times daily PRN        01/13/22 0508    ondansetron (ZOFRAN-ODT) 4 MG disintegrating tablet  Every 8 hours PRN        01/13/22 0508              Shon Baton, MD 01/13/22 [phone]

## 2022-01-13 NOTE — Discharge Instructions (Addendum)
You were seen today for abdominal pain and nausea.  Your work-up is notable for likely viral enteritis.  Take medications as prescribed.  Eat a bland diet and make sure you are staying hydrated.

## 2022-01-13 NOTE — ED Triage Notes (Signed)
Pt c/o abd pain and frequent urination.

## 2022-03-09 ENCOUNTER — Other Ambulatory Visit: Payer: Self-pay

## 2022-03-09 ENCOUNTER — Encounter (HOSPITAL_COMMUNITY): Payer: Self-pay | Admitting: *Deleted

## 2022-03-09 ENCOUNTER — Emergency Department (HOSPITAL_COMMUNITY)
Admission: EM | Admit: 2022-03-09 | Discharge: 2022-03-09 | Disposition: A | Discharge status: Home / Self Care | Payer: Medicaid Other | Attending: Emergency Medicine | Admitting: Emergency Medicine

## 2022-03-09 DIAGNOSIS — D72829 Elevated white blood cell count, unspecified: Secondary | ICD-10-CM | POA: Diagnosis not present

## 2022-03-09 DIAGNOSIS — Z113 Encounter for screening for infections with a predominantly sexual mode of transmission: Secondary | ICD-10-CM | POA: Diagnosis not present

## 2022-03-09 DIAGNOSIS — N939 Abnormal uterine and vaginal bleeding, unspecified: Secondary | ICD-10-CM | POA: Diagnosis present

## 2022-03-09 LAB — LIPASE, BLOOD: Lipase: 45 U/L (ref 11–51)

## 2022-03-09 LAB — CBC WITH DIFFERENTIAL/PLATELET
Abs Immature Granulocytes: 0.01 10*3/uL (ref 0.00–0.07)
Basophils Absolute: 0 10*3/uL (ref 0.0–0.1)
Basophils Relative: 0 %
Eosinophils Absolute: 0.3 10*3/uL (ref 0.0–0.5)
Eosinophils Relative: 4 %
HCT: 36.5 % (ref 36.0–46.0)
Hemoglobin: 11.9 g/dL — ABNORMAL LOW (ref 12.0–15.0)
Immature Granulocytes: 0 %
Lymphocytes Relative: 25 %
Lymphs Abs: 2 10*3/uL (ref 0.7–4.0)
MCH: 26 pg (ref 26.0–34.0)
MCHC: 32.6 g/dL (ref 30.0–36.0)
MCV: 79.9 fL — ABNORMAL LOW (ref 80.0–100.0)
Monocytes Absolute: 0.7 10*3/uL (ref 0.1–1.0)
Monocytes Relative: 10 %
Neutro Abs: 4.7 10*3/uL (ref 1.7–7.7)
Neutrophils Relative %: 61 %
Platelets: 191 10*3/uL (ref 150–400)
RBC: 4.57 MIL/uL (ref 3.87–5.11)
RDW: 17.6 % — ABNORMAL HIGH (ref 11.5–15.5)
WBC: 7.8 10*3/uL (ref 4.0–10.5)
nRBC: 0 % (ref 0.0–0.2)

## 2022-03-09 LAB — COMPREHENSIVE METABOLIC PANEL
ALT: 26 U/L (ref 0–44)
AST: 18 U/L (ref 15–41)
Albumin: 3.8 g/dL (ref 3.5–5.0)
Alkaline Phosphatase: 57 U/L (ref 38–126)
Anion gap: 6 (ref 5–15)
BUN: 9 mg/dL (ref 6–20)
CO2: 24 mmol/L (ref 22–32)
Calcium: 8.8 mg/dL — ABNORMAL LOW (ref 8.9–10.3)
Chloride: 107 mmol/L (ref 98–111)
Creatinine, Ser: 0.72 mg/dL (ref 0.44–1.00)
GFR, Estimated: >60 mL/min (ref >60)
Glucose, Bld: 100 mg/dL — ABNORMAL HIGH (ref 70–99)
Potassium: 3.4 mmol/L — ABNORMAL LOW (ref 3.5–5.1)
Sodium: 137 mmol/L (ref 135–145)
Total Bilirubin: 0.4 mg/dL (ref 0.3–1.2)
Total Protein: 6.9 g/dL (ref 6.5–8.1)

## 2022-03-09 LAB — URINALYSIS, ROUTINE W REFLEX MICROSCOPIC
Bilirubin Urine: NEGATIVE
Glucose, UA: NEGATIVE mg/dL
Ketones, ur: NEGATIVE mg/dL
Nitrite: NEGATIVE
Protein, ur: NEGATIVE mg/dL
Specific Gravity, Urine: 1.01 (ref 1.005–1.030)
pH: 6 (ref 5.0–8.0)

## 2022-03-09 LAB — WET PREP, GENITAL
Sperm: NONE SEEN
Trich, Wet Prep: NONE SEEN
Yeast Wet Prep HPF POC: NONE SEEN

## 2022-03-09 LAB — HCG, QUANTITATIVE, PREGNANCY: hCG, Beta Chain, Quant, S: <1 m[IU]/mL (ref <5)

## 2022-03-09 MED ORDER — LIDOCAINE HCL (PF) 1 % IJ SOLN
2.0000 mL | Freq: Once | INTRAMUSCULAR | Status: AC
  Administered 2022-03-09: 2 mL
  Filled 2022-03-09: qty 5

## 2022-03-09 MED ORDER — DOXYCYCLINE HYCLATE 100 MG PO TABS
100.0000 mg | ORAL_TABLET | Freq: Once | ORAL | Status: AC
  Administered 2022-03-09: 100 mg via ORAL
  Filled 2022-03-09: qty 1

## 2022-03-09 MED ORDER — DOXYCYCLINE HYCLATE 100 MG PO CAPS: 100.0000 mg | ORAL_CAPSULE | Freq: Two times a day (BID) | ORAL | 0 refills | Status: AC

## 2022-03-09 MED ORDER — CEFTRIAXONE SODIUM 500 MG IJ SOLR
500.0000 mg | Freq: Once | INTRAMUSCULAR | Status: AC
  Administered 2022-03-09: 500 mg via INTRAMUSCULAR
  Filled 2022-03-09: qty 500

## 2022-03-09 NOTE — Discharge Instructions (Signed)
Your blood and urine test are reassuring.  Your gonorrhea and Chlamydia test are still pending.  This may take 2 to 3 days.  You can review the results on MyChart.  You have been treated today with medications for STI.  You have also been given prescription for doxycycline.  Take this medication as directed until finished.  You may follow-up with your primary care provider for recheck or follow-up with OB GYN office listed.

## 2022-03-09 NOTE — ED Triage Notes (Signed)
Pt c/o vaginal bleeding, external swelling,  dysuria and lower abdominal cramping since yesterday. Pt reports her tubes are tied but requests a pregnancy test, STD and UTI check. Denies fever, n/v.

## 2022-03-09 NOTE — ED Provider Notes (Signed)
[facility] EMERGENCY DEPARTMENT Provider Note   CSN: [id number] Arrival date & time: 03/09/22  0756     History  Chief Complaint  Patient presents with   Vaginal Bleeding    Vicki Adams is a 26 y.o. female.   Vaginal Bleeding Associated symptoms: no abdominal pain, no back pain, no dizziness, no dysuria, no fever, no nausea and no vaginal discharge        Vicki Adams is a 26 y.o. female who presents to the Emergency Department requesting evaluation for STI check, pregnancy test and irregular vaginal bleeding.  She is also requesting to be tested for UTI.  She states that she had unprotected intercourse with a new sexual partner recently and is concerned she may have developed an STI.  She notes history of irregular vaginal bleeding and breakthrough bleeding between her menses.  She states this has been going on for some time and denies any increased bleeding, pelvic pain or vaginal discharge.  Use any form of birth control.  No recent fever or chills, genital lesions, or back pain.  Home Medications Prior to Admission medications   Medication Sig Start Date End Date Taking? Authorizing Provider  albuterol (VENTOLIN HFA) 108 (90 Base) MCG/ACT inhaler Inhale into the lungs every 6 (six) hours as needed for wheezing or shortness of breath. Patient not taking: Reported on 03/09/2022    [provider]  dicyclomine (BENTYL) 20 MG tablet Take 1 tablet (20 mg total) by mouth 2 (two) times daily as needed for spasms. Patient not taking: Reported on 03/09/2022 01/13/22   Merryl Hacker, MD  HYDROcodone-acetaminophen (HYCET) 7.5-325 mg/15 ml solution 10-15 cc PO every 4-6 hours as needed for pain Patient not taking: Reported on 03/09/2022 07/15/21   Clyde Canterbury, MD  methocarbamol (ROBAXIN) 500 MG tablet Take 1 tablet (500 mg total) by mouth 2 (two) times daily. Patient not taking: Reported on 03/09/2022 11/13/21   Cherlynn June B, PA-C  ondansetron  (ZOFRAN-ODT) 4 MG disintegrating tablet Take 1 tablet (4 mg total) by mouth every 8 (eight) hours as needed for nausea or vomiting. Patient not taking: Reported on 03/09/2022 01/13/22   Horton, Barbette Hair, MD  prednisoLONE (ORAPRED) 15 MG/5ML solution 10 cc PO BID x 3 days, then 5 cc PO BID x 3 days, then 5 cc PO QD x 3 days Patient not taking: Reported on 03/09/2022 07/15/21   Clyde Canterbury, MD      Allergies    Erythromycin    Review of Systems   Review of Systems  Constitutional:  Negative for chills and fever.  Respiratory:  Negative for chest tightness and shortness of breath.   Cardiovascular:  Negative for chest pain.  Gastrointestinal:  Negative for abdominal pain, nausea and vomiting.  Genitourinary:  Positive for menstrual problem and vaginal bleeding. Negative for difficulty urinating, dysuria and vaginal discharge.  Musculoskeletal:  Negative for back pain.  Skin:  Negative for rash.  Neurological:  Negative for dizziness, syncope, weakness and headaches.    Physical Exam Updated Vital Signs BP 115/75 (BP Location: Right Arm)   Pulse 62   Temp 98 F (36.7 C) (Oral)   Resp 14   Ht 5\' 7"  (1.702 m)   Wt 78 kg   LMP  (Within Weeks)   SpO2 96%   BMI 26.93 kg/m  Physical Exam Vitals and nursing note reviewed. Exam conducted with a chaperone present.  Constitutional:      General: She is not in acute distress.  Appearance: Normal appearance. She is not toxic-appearing.  Cardiovascular:     Rate and Rhythm: Normal rate and regular rhythm.     Pulses: Normal pulses.  Pulmonary:     Effort: Pulmonary effort is normal.     Breath sounds: Normal breath sounds.  Abdominal:     General: There is no distension.     Palpations: Abdomen is soft.     Tenderness: There is no abdominal tenderness. There is no right CVA tenderness, left CVA tenderness or guarding.  Genitourinary:    Vagina: No foreign body. No lesions.     Cervix: Cervical bleeding present. No cervical motion  tenderness, discharge, friability or lesion.     Uterus: Normal.      Adnexa: Right adnexa normal.       Right: No mass or tenderness.         Left: No mass or tenderness.       Comments: Pelvic exam performed by me, no cervical motion tenderness.  There is small amount of blood in the vaginal vault.  Bilateral adnexa nontender without palpable masses. Musculoskeletal:        General: No tenderness. Normal range of motion.  Skin:    General: Skin is warm.     Findings: No rash.  Neurological:     Mental Status: She is alert.     ED Results / Procedures / Treatments   Labs (all labs ordered are listed, but only abnormal results are displayed) Labs Reviewed  COMPREHENSIVE METABOLIC PANEL - Abnormal; Notable for the following components:      Result Value   Potassium 3.4 (*)    Glucose, Bld 100 (*)    Calcium 8.8 (*)    All other components within normal limits  CBC WITH DIFFERENTIAL/PLATELET - Abnormal; Notable for the following components:   Hemoglobin 11.9 (*)    MCV 79.9 (*)    RDW 17.6 (*)    All other components within normal limits  WET PREP, GENITAL  LIPASE, BLOOD  HCG, QUANTITATIVE, PREGNANCY  URINALYSIS, ROUTINE W REFLEX MICROSCOPIC  GC/CHLAMYDIA PROBE AMP ([place]) NOT AT [facility]    EKG None  Radiology No results found.  Procedures Procedures    Medications Ordered in ED Medications - No data to display  ED Course/ Medical Decision Making/ A&P                           Medical Decision Making Patient here requesting evaluation of symptoms of dysuria and concern for possible exposure to STI.  Requesting pregnancy test and to be tested for any STIs.  She also notes history of irregular vaginal bleeding for some time, but denies any abdominal/pelvic pain or worsening symptoms.  On exam, patient well-appearing nontoxic.  Vital signs are reassuring.  Abdominal exam is also reassuring.  No Peritoneal signs.  Pelvic exam performed by me without CMT or  palpable adnexal masses.  There is mild to moderate mount of blood in the vaginal vault.  Differential would include but not limited to DUB, STI, PID, pregnancy, cystitis, TOA, pregnancy.  I feel that PID tubo-ovarian abscess or torsion is felt to be less likely given absence pelvic or abdominal pain, fever, and absence of CMT and adnexal mass.  Amount and/or Complexity of Data Reviewed Labs: ordered.    Details: Labs interpreted by me, no evidence of leukocytosis or significant anemia, chemistries without significant derangement, lipase beta-hCG less than 1.  Urinalysis shows  large hemoglobin which is likely contaminant given patient's menstruating.  There is 11-20 white cells and trace leukocytes with rare bacteria.  Urine culture is pending.  Wet prep shows presence of clue cells and few WBC.  GC and Chlamydia cultures are pending Discussion of management or test interpretation with external provider(s): Overall, patient has reassuring work-up.  Hemodynamically stable.  Low clinical suspicion for emergent process.  Given patient's concern for possible STI exposure, will provide treatment for GC and chlamydia.  Patient given IM Rocephin here and prescription for doxycycline.  She will also follow-up with OB/GYN regarding her abnormal vaginal bleeding.  She appears appropriate for discharge home.  Return precautions discussed.  Risk Prescription drug management.           Final Clinical Impression(s) / ED Diagnoses Final diagnoses:  Routine screening for STI (sexually transmitted infection)  Abnormal vaginal bleeding    Rx / DC Orders ED Discharge Orders     None         Pauline Aus, PA-C 03/12/22 1350    Mardene Sayer, MD 03/13/22 [phone]

## 2022-03-10 LAB — GC/CHLAMYDIA PROBE AMP (~~LOC~~) NOT AT ARMC
Chlamydia: NEGATIVE
Comment: NEGATIVE
Comment: NORMAL
Neisseria Gonorrhea: NEGATIVE

## 2022-07-10 ENCOUNTER — Ambulatory Visit (HOSPITAL_BASED_OUTPATIENT_CLINIC_OR_DEPARTMENT_OTHER): Payer: Medicaid Other | Attending: Family Medicine | Admitting: Internal Medicine
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
## 2023-07-06 ENCOUNTER — Other Ambulatory Visit: Payer: Self-pay

## 2023-07-06 ENCOUNTER — Emergency Department
Admission: EM | Admit: 2023-07-06 | Discharge: 2023-07-06 | Disposition: A | Discharge status: Home / Self Care | Payer: Medicaid Other | Attending: Emergency Medicine | Admitting: Emergency Medicine

## 2023-07-06 DIAGNOSIS — Z0000 Encounter for general adult medical examination without abnormal findings: Secondary | ICD-10-CM | POA: Insufficient documentation

## 2023-07-06 LAB — HCG, QUANTITATIVE, PREGNANCY: hCG, Beta Chain, Quant, S: <1 m[IU]/mL (ref <5)

## 2023-07-06 LAB — CBC WITH DIFFERENTIAL/PLATELET
Abs Immature Granulocytes: 0.03 10*3/uL (ref 0.00–0.07)
Basophils Absolute: 0.1 10*3/uL (ref 0.0–0.1)
Basophils Relative: 0 %
Eosinophils Absolute: 0.2 10*3/uL (ref 0.0–0.5)
Eosinophils Relative: 2 %
HCT: 38.1 % (ref 36.0–46.0)
Hemoglobin: 13.1 g/dL (ref 12.0–15.0)
Immature Granulocytes: 0 %
Lymphocytes Relative: 36 %
Lymphs Abs: 4.1 10*3/uL — ABNORMAL HIGH (ref 0.7–4.0)
MCH: 29.8 pg (ref 26.0–34.0)
MCHC: 34.4 g/dL (ref 30.0–36.0)
MCV: 86.8 fL (ref 80.0–100.0)
Monocytes Absolute: 0.7 10*3/uL (ref 0.1–1.0)
Monocytes Relative: 7 %
Neutro Abs: 6.3 10*3/uL (ref 1.7–7.7)
Neutrophils Relative %: 55 %
Platelets: 194 10*3/uL (ref 150–400)
RBC: 4.39 MIL/uL (ref 3.87–5.11)
RDW: 14.6 % (ref 11.5–15.5)
WBC: 11.3 10*3/uL — ABNORMAL HIGH (ref 4.0–10.5)
nRBC: 0 % (ref 0.0–0.2)

## 2023-07-06 LAB — URINALYSIS, ROUTINE W REFLEX MICROSCOPIC
Bilirubin Urine: NEGATIVE
Glucose, UA: NEGATIVE mg/dL
Hgb urine dipstick: NEGATIVE
Ketones, ur: NEGATIVE mg/dL
Leukocytes,Ua: NEGATIVE
Nitrite: NEGATIVE
Protein, ur: NEGATIVE mg/dL
Specific Gravity, Urine: 1.003 — ABNORMAL LOW (ref 1.005–1.030)
pH: 6 (ref 5.0–8.0)

## 2023-07-06 LAB — BASIC METABOLIC PANEL
Anion gap: 14 (ref 5–15)
BUN: 11 mg/dL (ref 6–20)
CO2: 24 mmol/L (ref 22–32)
Calcium: 9.1 mg/dL (ref 8.9–10.3)
Chloride: 98 mmol/L (ref 98–111)
Creatinine, Ser: 0.82 mg/dL (ref 0.44–1.00)
GFR, Estimated: >60 mL/min (ref >60)
Glucose, Bld: 108 mg/dL — ABNORMAL HIGH (ref 70–99)
Potassium: 3.2 mmol/L — ABNORMAL LOW (ref 3.5–5.1)
Sodium: 136 mmol/L (ref 135–145)

## 2023-07-06 LAB — POC URINE PREG, ED: Preg Test, Ur: NEGATIVE

## 2023-07-06 NOTE — ED Provider Notes (Signed)
   [facility] Provider Note    Event Date/Time   First MD Initiated Contact with Patient 07/06/23 2110     (approximate)  History   Chief Complaint: Back Pain  HPI  Vicki Adams is a 28 y.o. female with a past history of anemia, presents to the emergency department with concerns of possible pregnancy.  According to the patient she had a tubal ligation in 2019.  Patient states she took 3 pregnancy test at home that were positive, patient was concerned so she came to the emergency department for evaluation.  Denies any abdominal pain denies any vaginal bleeding or discharge.  Physical Exam   Triage Vital Signs: ED Triage Vitals  Encounter Vitals Group     BP 07/06/23 1941 120/80     Systolic BP Percentile --      Diastolic BP Percentile --      Pulse Rate 07/06/23 1941 85     Resp 07/06/23 1941 18     Temp 07/06/23 1941 98.7 F (37.1 C)     Temp Source 07/06/23 1941 Oral     SpO2 07/06/23 1941 97 %     Weight 07/06/23 1939 140 lb (63.5 kg)     Height 07/06/23 1939 5' 7 (1.702 m)     Head Circumference --      Peak Flow --      Pain Score 07/06/23 1939 6     Pain Loc --      Pain Education --      Exclude from Growth Chart --     Most recent vital signs: Vitals:   07/06/23 1941  BP: 120/80  Pulse: 85  Resp: 18  Temp: 98.7 F (37.1 C)  SpO2: 97%    General: Awake, no distress.  CV:  Good peripheral perfusion.  Regular rate and rhythm  Resp:  Normal effort.  Equal breath sounds bilaterally.  Abd:  No distention.  Soft, nontender.  ED Results / Procedures / Treatments   MEDICATIONS ORDERED IN ED: Medications - No data to display   IMPRESSION / MDM / ASSESSMENT AND PLAN / ED COURSE  I reviewed the triage vital signs and the nursing notes.  Patient's presentation is most consistent with acute, uncomplicated illness.  Patient presents the emergency department for taking 3 positive pregnancy test at home.  Patient states she  does not know how far along she is and had a tubal ligation in 2019.  Here patient denies any abdominal pain, reassuring physical exam benign abdominal exam.  Vital signs are reassuring, CBC is normal, chemistry is normal.  The patient's urine pregnancy test as well as quantitative beta-hCG are both negative.  Urinalysis shows no concerning findings.  I discussed results with the patient including the negative quantitative beta-hCG.  Patient very reassured by the negative results and states she is ready to go home.  She has no further complaints.  Patient states she does not want to wait for the paperwork and is ready to go now.  FINAL CLINICAL IMPRESSION(S) / ED DIAGNOSES   Medical examination   Note:  This document was prepared using Dragon voice recognition software and may include unintentional dictation errors.   Dorothyann Drivers, MD 07/06/23 2119

## 2023-07-06 NOTE — ED Triage Notes (Signed)
 Pt reports 3 at home + pregnancy tests, pt reports she had bilateral tubal ligation in 2019, pt reports lower back pain. Pt states she doesn't know how far along she is, denies vaginal bleeding or discharge.

## 2023-12-04 ENCOUNTER — Emergency Department

## 2023-12-04 ENCOUNTER — Emergency Department
Admission: EM | Admit: 2023-12-04 | Discharge: 2023-12-04 | Disposition: A | Discharge status: Home / Self Care | Attending: Emergency Medicine | Admitting: Emergency Medicine

## 2023-12-04 ENCOUNTER — Other Ambulatory Visit: Payer: Self-pay

## 2023-12-04 DIAGNOSIS — R519 Headache, unspecified: Secondary | ICD-10-CM | POA: Diagnosis present

## 2023-12-04 DIAGNOSIS — R1110 Vomiting, unspecified: Secondary | ICD-10-CM | POA: Diagnosis not present

## 2023-12-04 LAB — CBC WITH DIFFERENTIAL/PLATELET
Abs Immature Granulocytes: 0.04 10*3/uL (ref 0.00–0.07)
Basophils Absolute: 0.1 10*3/uL (ref 0.0–0.1)
Basophils Relative: 0 %
Eosinophils Absolute: 0.2 10*3/uL (ref 0.0–0.5)
Eosinophils Relative: 1 %
HCT: 37.2 % (ref 36.0–46.0)
Hemoglobin: 12.2 g/dL (ref 12.0–15.0)
Immature Granulocytes: 0 %
Lymphocytes Relative: 19 %
Lymphs Abs: 2.2 10*3/uL (ref 0.7–4.0)
MCH: 28.6 pg (ref 26.0–34.0)
MCHC: 32.8 g/dL (ref 30.0–36.0)
MCV: 87.1 fL (ref 80.0–100.0)
Monocytes Absolute: 0.7 10*3/uL (ref 0.1–1.0)
Monocytes Relative: 6 %
Neutro Abs: 8.5 10*3/uL — ABNORMAL HIGH (ref 1.7–7.7)
Neutrophils Relative %: 74 %
Platelets: 210 10*3/uL (ref 150–400)
RBC: 4.27 MIL/uL (ref 3.87–5.11)
RDW: 14.2 % (ref 11.5–15.5)
WBC: 11.6 10*3/uL — ABNORMAL HIGH (ref 4.0–10.5)
nRBC: 0 % (ref 0.0–0.2)

## 2023-12-04 LAB — URINALYSIS, ROUTINE W REFLEX MICROSCOPIC
Bacteria, UA: NONE SEEN
Bilirubin Urine: NEGATIVE
Glucose, UA: NEGATIVE mg/dL
Ketones, ur: NEGATIVE mg/dL
Leukocytes,Ua: NEGATIVE
Nitrite: NEGATIVE
Protein, ur: NEGATIVE mg/dL
Specific Gravity, Urine: 1.017 (ref 1.005–1.030)
pH: 6 (ref 5.0–8.0)

## 2023-12-04 LAB — COMPREHENSIVE METABOLIC PANEL WITH GFR
ALT: 11 U/L (ref 0–44)
AST: 14 U/L — ABNORMAL LOW (ref 15–41)
Albumin: 3.8 g/dL (ref 3.5–5.0)
Alkaline Phosphatase: 40 U/L (ref 38–126)
Anion gap: 7 (ref 5–15)
BUN: 7 mg/dL (ref 6–20)
CO2: 27 mmol/L (ref 22–32)
Calcium: 9.1 mg/dL (ref 8.9–10.3)
Chloride: 107 mmol/L (ref 98–111)
Creatinine, Ser: 0.68 mg/dL (ref 0.44–1.00)
GFR, Estimated: >60 mL/min (ref >60)
Glucose, Bld: 118 mg/dL — ABNORMAL HIGH (ref 70–99)
Potassium: 3.5 mmol/L (ref 3.5–5.1)
Sodium: 141 mmol/L (ref 135–145)
Total Bilirubin: 0.4 mg/dL (ref 0.0–1.2)
Total Protein: 7 g/dL (ref 6.5–8.1)

## 2023-12-04 LAB — POC URINE PREG, ED: Preg Test, Ur: NEGATIVE

## 2023-12-04 LAB — LIPASE, BLOOD: Lipase: 34 U/L (ref 11–51)

## 2023-12-04 MED ORDER — ONDANSETRON HCL 4 MG/2ML IJ SOLN
4.0000 mg | Freq: Once | INTRAMUSCULAR | Status: AC
  Filled 2023-12-04: qty 2

## 2023-12-04 MED ORDER — DEXAMETHASONE SODIUM PHOSPHATE 10 MG/ML IJ SOLN
10.0000 mg | Freq: Once | INTRAMUSCULAR | Status: AC
  Filled 2023-12-04: qty 1

## 2023-12-04 MED ORDER — KETOROLAC TROMETHAMINE 15 MG/ML IJ SOLN
15.0000 mg | Freq: Once | INTRAMUSCULAR | Status: AC
Start: 2023-12-04 — End: 2023-12-04
  Filled 2023-12-04: qty 1

## 2023-12-04 MED ORDER — SODIUM CHLORIDE 0.9 % IV BOLUS
1000.0000 mL | Freq: Once | INTRAVENOUS | Status: AC
  Administered 2023-12-04: 1000 mL via INTRAVENOUS
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
